# Patient Record
Sex: Male | Born: 1950
Health system: Southern US, Community
[De-identification: ages and names within clinical notes are randomized; demographics above are authoritative.]

## PROBLEM LIST (undated history)

## (undated) DIAGNOSIS — K219 Gastro-esophageal reflux disease without esophagitis: Secondary | ICD-10-CM

## (undated) DIAGNOSIS — E7800 Pure hypercholesterolemia, unspecified: Secondary | ICD-10-CM

## (undated) DIAGNOSIS — E559 Vitamin D deficiency, unspecified: Secondary | ICD-10-CM

## (undated) DIAGNOSIS — M722 Plantar fascial fibromatosis: Secondary | ICD-10-CM

## (undated) DIAGNOSIS — I10 Essential (primary) hypertension: Secondary | ICD-10-CM

## (undated) HISTORY — DX: Essential (primary) hypertension: I10

## (undated) HISTORY — PX: COLONOSCOPY: SHX174

## (undated) HISTORY — DX: Gastro-esophageal reflux disease without esophagitis: K21.9

## (undated) HISTORY — DX: Vitamin D deficiency, unspecified: E55.9

## (undated) HISTORY — DX: Plantar fascial fibromatosis: M72.2

---

## 2013-04-17 HISTORY — PX: ROTATOR CUFF REPAIR: SHX139

## 2013-07-19 ENCOUNTER — Emergency Department (HOSPITAL_COMMUNITY): Payer: BC Managed Care – PPO

## 2013-07-19 ENCOUNTER — Emergency Department (HOSPITAL_COMMUNITY)
Admission: EM | Admit: 2013-07-19 | Discharge: 2013-07-19 | Disposition: A | Discharge status: Home / Self Care | Payer: BC Managed Care – PPO | Attending: Emergency Medicine | Admitting: Emergency Medicine

## 2013-07-19 ENCOUNTER — Encounter (HOSPITAL_COMMUNITY): Payer: Self-pay | Admitting: Emergency Medicine

## 2013-07-19 DIAGNOSIS — Z79899 Other long term (current) drug therapy: Secondary | ICD-10-CM | POA: Insufficient documentation

## 2013-07-19 DIAGNOSIS — R079 Chest pain, unspecified: Secondary | ICD-10-CM

## 2013-07-19 DIAGNOSIS — R072 Precordial pain: Secondary | ICD-10-CM | POA: Insufficient documentation

## 2013-07-19 DIAGNOSIS — Z7982 Long term (current) use of aspirin: Secondary | ICD-10-CM | POA: Insufficient documentation

## 2013-07-19 DIAGNOSIS — R1013 Epigastric pain: Secondary | ICD-10-CM | POA: Insufficient documentation

## 2013-07-19 DIAGNOSIS — E7800 Pure hypercholesterolemia, unspecified: Secondary | ICD-10-CM | POA: Insufficient documentation

## 2013-07-19 HISTORY — DX: Pure hypercholesterolemia, unspecified: E78.00

## 2013-07-19 LAB — CBC
HCT: 41.9 % (ref 39.0–52.0)
Hemoglobin: 14.8 g/dL (ref 13.0–17.0)
MCH: 31.3 pg (ref 26.0–34.0)
MCHC: 35.3 g/dL (ref 30.0–36.0)
MCV: 88.6 fL (ref 78.0–100.0)
PLATELETS: 217 10*3/uL (ref 150–400)
RBC: 4.73 MIL/uL (ref 4.22–5.81)
RDW: 12.9 % (ref 11.5–15.5)
WBC: 5 10*3/uL (ref 4.0–10.5)

## 2013-07-19 LAB — BASIC METABOLIC PANEL
BUN: 16 mg/dL (ref 6–23)
CALCIUM: 9 mg/dL (ref 8.4–10.5)
CO2: 29 mEq/L (ref 19–32)
CREATININE: 0.83 mg/dL (ref 0.50–1.35)
Chloride: 105 mEq/L (ref 96–112)
GFR calc Af Amer: >90 mL/min (ref >90)
GLUCOSE: 85 mg/dL (ref 70–99)
Potassium: 4.2 mEq/L (ref 3.7–5.3)
SODIUM: 143 meq/L (ref 137–147)

## 2013-07-19 LAB — I-STAT TROPONIN, ED
TROPONIN I, POC: 0.01 ng/mL (ref 0.00–0.08)
Troponin i, poc: 0.01 ng/mL (ref 0.00–0.08)

## 2013-07-19 MED ORDER — RANITIDINE HCL 150 MG PO CAPS: 150.0000 mg | ORAL_CAPSULE | Freq: Every day | ORAL | Status: DC

## 2013-07-19 MED ORDER — PANTOPRAZOLE SODIUM 40 MG PO TBEC
40.0000 mg | DELAYED_RELEASE_TABLET | Freq: Once | ORAL | Status: AC
  Administered 2013-07-19: 40 mg via ORAL
  Filled 2013-07-19: qty 1

## 2013-07-19 MED ORDER — ASPIRIN 325 MG PO TABS
325.0000 mg | ORAL_TABLET | ORAL | Status: AC
  Administered 2013-07-19: 325 mg via ORAL
  Filled 2013-07-19: qty 1

## 2013-07-19 MED ORDER — GI COCKTAIL ~~LOC~~
30.0000 mL | Freq: Once | ORAL | Status: AC
  Administered 2013-07-19: 30 mL via ORAL
  Filled 2013-07-19: qty 30

## 2013-07-19 MED ORDER — NITROGLYCERIN 0.4 MG SL SUBL: 0.4000 mg | SUBLINGUAL_TABLET | SUBLINGUAL | Status: DC | PRN

## 2013-07-19 NOTE — ED Notes (Signed)
Pt. Stated, it started about a week ago with some indigestion and some tightness and hasn't stopped.

## 2013-07-19 NOTE — ED Provider Notes (Signed)
CSN: [id number]     Arrival date & time 07/19/13  1345 History   First MD Initiated Contact with Patient 07/19/13 1501     Chief Complaint  Patient presents with  . Chest Pain     (Consider location/radiation/quality/duration/timing/severity/associated sxs/prior Treatment) Patient is a 63 y.o. male presenting with chest pain. The history is provided by the patient and the spouse. No language interpreter was used.  Chest Pain Pain location:  Epigastric and substernal area Pain quality: sharp and tightness   Pain radiates to:  Does not radiate Pain radiates to the back: no   Pain severity:  Mild Onset quality:  Unable to specify Duration:  10 days Timing:  Intermittent Progression:  Worsening Chronicity:  New Context: at rest   Relieved by:  Nothing Worsened by:  Nothing tried Ineffective treatments:  None tried Associated symptoms: no abdominal pain, no back pain, no cough, no diaphoresis, no dizziness, no dysphagia, no fatigue, no fever, no headache, no nausea, no near-syncope, no numbness, no shortness of breath, not vomiting and no weakness   Associated symptoms comment:  "sour stomach"  Risk factors: high cholesterol and male sex   Risk factors: no aortic disease, no coronary artery disease, no diabetes mellitus, not obese, no prior DVT/PE and no smoking     Past Medical History  Diagnosis Date  . Hypercholesterolemia    Past Surgical History  Procedure Laterality Date  . Shoulder surgery     No family history on file. History  Substance Use Topics  . Smoking status: Never Smoker   . Smokeless tobacco: Never Used  . Alcohol Use: Yes    Review of Systems  Constitutional: Negative for fever, diaphoresis, activity change, appetite change and fatigue.  HENT: Negative for congestion, facial swelling, rhinorrhea and trouble swallowing.   Eyes: Negative for photophobia and pain.  Respiratory: Negative for cough, chest tightness and shortness of breath.    Cardiovascular: Positive for chest pain. Negative for leg swelling and near-syncope.  Gastrointestinal: Negative for nausea, vomiting, abdominal pain, diarrhea and constipation.  Endocrine: Negative for polydipsia and polyuria.  Genitourinary: Negative for dysuria, urgency, decreased urine volume and difficulty urinating.  Musculoskeletal: Negative for back pain and gait problem.  Skin: Negative for color change, rash and wound.  Allergic/Immunologic: Negative for immunocompromised state.  Neurological: Negative for dizziness, facial asymmetry, speech difficulty, weakness, numbness and headaches.  Psychiatric/Behavioral: Negative for confusion, decreased concentration and agitation.      Allergies  Review of patient's allergies indicates no known allergies.  Home Medications   Current Outpatient Rx  Name  Route  Sig  Dispense  Refill  . aspirin EC 81 MG tablet   Oral   Take 81 mg by mouth daily.         Marland Kitchen EPINEPHrine (EPIPEN) 0.3 mg/0.3 mL SOAJ injection   Intramuscular   Inject 0.3 mg into the muscle once.         Marland Kitchen omega-3 acid ethyl esters (LOVAZA) 1 G capsule   Oral   Take 1 g by mouth daily.         . pravastatin (PRAVACHOL) 20 MG tablet   Oral   Take 20 mg by mouth daily.         . sertraline (ZOLOFT) 50 MG tablet   Oral   Take 50 mg by mouth daily.         . ranitidine (ZANTAC) 150 MG capsule   Oral   Take 1 capsule (150 mg total) by  mouth daily.   30 capsule   0    BP 117/81  Pulse 50  Temp(Src) 98 F (36.7 C) (Oral)  Resp 12  Ht 5\' 6"  (1.676 m)  Wt 155 lb (70.308 kg)  BMI 25.03 kg/m2  SpO2 100% Physical Exam  Constitutional: He is oriented to person, place, and time. He appears well-developed and well-nourished. No distress.  HENT:  Head: Normocephalic and atraumatic.  Mouth/Throat: No oropharyngeal exudate.  Eyes: Pupils are equal, round, and reactive to light.  Neck: Normal range of motion. Neck supple.  Cardiovascular: Normal  rate, regular rhythm and normal heart sounds.  Exam reveals no gallop and no friction rub.   No murmur heard. Pulmonary/Chest: Effort normal and breath sounds normal. No respiratory distress. He has no wheezes. He has no rales.  Abdominal: Soft. Bowel sounds are normal. He exhibits no distension and no mass. There is tenderness in the epigastric area. There is no rebound and no guarding.  Musculoskeletal: Normal range of motion. He exhibits no edema and no tenderness.  Neurological: He is alert and oriented to person, place, and time.  Skin: Skin is warm and dry.  Psychiatric: He has a normal mood and affect.    ED Course  Procedures (including critical care time) Labs Review Labs Reviewed  BASIC METABOLIC PANEL  CBC  I-STAT Belcher, ED  I-STAT TROPOININ, ED  Randolm Idol, ED   Imaging Review Dg Chest 2 View  07/19/2013   CLINICAL DATA:  Chest pain  EXAM: CHEST  2 VIEW  COMPARISON:  None.  FINDINGS: The heart size and mediastinal contours are within normal limits. Both lungs are clear. The visualized skeletal structures are unremarkable.  IMPRESSION: No active cardiopulmonary disease.   Electronically Signed   By: Inez Catalina M.D.   On: 07/19/2013 16:02     EKG Interpretation   Date/Time:  Saturday July 19 2013 13:53:45 EDT Ventricular Rate:  63 PR Interval:  146 QRS Duration: 90 QT Interval:  406 QTC Calculation: 415 R Axis:   68 Text Interpretation:  Normal sinus rhythm TWI III, aVF No prior for  comparison Confirmed by Cecille Mcclusky  MD, Gavynn Duvall ([id number]) on 07/19/2013 3:06:47 PM      MDM   Final diagnoses:  Chest pain    Pt is a 63 y.o. male with Pmhx as above who presents with what he describes as about 10 days of "indigestion" symptoms of sour stomach, and intermittent sharp pains across chest which have become more frequent since waking today at about 8:30. Pains last several mins, resolve for several mins before returning. No aggravating or alleviating symptoms. No  SOB, n/v, diaphoresis, leg pain/edema. He drove from [place] about 1 week ago to visit daughter in [place], but symptoms had began several days prior to departure. Hx of nuclear stress test about 5 years ago which he reports as nml. On PE, VSS, pt in NAD.  EKG w/ TWI NSTWC, no prior. I-stat Trop neg at 0.01. Pt states he is feeling somewhat improved after ASA & GI cocktail.  Given symptoms are stuttering, will get delta trop.   Delta trop negative. Pt feeling improved. As they are planning of being out of town for several more weeks, age, sex, hx, and EKG findings, spoke w/ cardiology about arranging outpt stress test and the office will try to work him in on Monday. Will start rx for zantac.  Return precautions given for new or worsening symptoms including worsening pain, SOB, diaphoresis.  Neta Ehlers, MD 07/19/13 [phone]

## 2013-07-19 NOTE — Discharge Instructions (Signed)
Chest Pain (Nonspecific) °It is often hard to give a specific diagnosis for the cause of chest pain. There is always a chance that your pain could be related to something serious, such as a heart attack or a blood clot in the lungs. You need to follow up with your caregiver for further evaluation. °CAUSES  °· Heartburn. °· Pneumonia or bronchitis. °· Anxiety or stress. °· Inflammation around your heart (pericarditis) or lung (pleuritis or pleurisy). °· A blood clot in the lung. °· A collapsed lung (pneumothorax). It can develop suddenly on its own (spontaneous pneumothorax) or from injury (trauma) to the chest. °· Shingles infection (herpes zoster virus). °The chest wall is composed of bones, muscles, and cartilage. Any of these can be the source of the pain. °· The bones can be bruised by injury. °· The muscles or cartilage can be strained by coughing or overwork. °· The cartilage can be affected by inflammation and become sore (costochondritis). °DIAGNOSIS  °Lab tests or other studies, such as X-rays, electrocardiography, stress testing, or cardiac imaging, may be needed to find the cause of your pain.  °TREATMENT  °· Treatment depends on what may be causing your chest pain. Treatment may include: °· Acid blockers for heartburn. °· Anti-inflammatory medicine. °· Pain medicine for inflammatory conditions. °· Antibiotics if an infection is present. °· You may be advised to change lifestyle habits. This includes stopping smoking and avoiding alcohol, caffeine, and chocolate. °· You may be advised to keep your head raised (elevated) when sleeping. This reduces the chance of acid going backward from your stomach into your esophagus. °· Most of the time, nonspecific chest pain will improve within 2 to 3 days with rest and mild pain medicine. °HOME CARE INSTRUCTIONS  °· If antibiotics were prescribed, take your antibiotics as directed. Finish them even if you start to feel better. °· For the next few days, avoid physical  activities that bring on chest pain. Continue physical activities as directed. °· Do not smoke. °· Avoid drinking alcohol. °· Only take over-the-counter or prescription medicine for pain, discomfort, or fever as directed by your caregiver. °· Follow your caregiver's suggestions for further testing if your chest pain does not go away. °· Keep any follow-up appointments you made. If you do not go to an appointment, you could develop lasting (chronic) problems with pain. If there is any problem keeping an appointment, you must call to reschedule. °SEEK MEDICAL CARE IF:  °· You think you are having problems from the medicine you are taking. Read your medicine instructions carefully. °· Your chest pain does not go away, even after treatment. °· You develop a rash with blisters on your chest. °SEEK IMMEDIATE MEDICAL CARE IF:  °· You have increased chest pain or pain that spreads to your arm, neck, jaw, back, or abdomen. °· You develop shortness of breath, an increasing cough, or you are coughing up blood. °· You have severe back or abdominal pain, feel nauseous, or vomit. °· You develop severe weakness, fainting, or chills. °· You have a fever. °THIS IS AN EMERGENCY. Do not wait to see if the pain will go away. Get medical help at once. Call your local emergency services (911 in [place] Do not drive yourself to the hospital. °MAKE SURE YOU:  °· Understand these instructions. °· Will watch your condition. °· Will get help right away if you are not doing well or get worse. °Document Released: 01/11/2005 Document Revised: 06/26/2011 Document Reviewed: 11/07/2007 °ExitCare® Patient Information ©2014 [employer]   [employer]. ° °

## 2013-07-21 ENCOUNTER — Telehealth: Payer: Self-pay | Admitting: *Deleted

## 2013-07-21 NOTE — Telephone Encounter (Signed)
Per Dr. Rosezella Florida note to Triage to call pt to have him seen by DOD today or Tomorrow (Tues).  Called pt.  He states he is feeling fine.  No SOB, no CP.  States he has spoken with his doctor in [place] and was advised if he was sx free now he should be fine.  Francisco Dixon states he is leaving in the morning going to [facility] and would not be back in [place] for 2 weeks. He states again that he feels good and appreciated the call. States he doesn't feel like he needs to be seen.

## 2013-07-21 NOTE — Telephone Encounter (Signed)
Message copied by Hetty Blend on Mon Jul 21, 2013  2:39 PM ------      Message from: Minus Breeding      Created: Sat Jul 19, 2013  7:50 PM       This patient needs to be an add on for the DOD new patient Mon or Tuesday (or wherever we can work him in.)  He is staying with family locally.  Please use local contact number.  Let me know who he is seeing. Request from ED.   ------

## 2015-11-12 DIAGNOSIS — I10 Essential (primary) hypertension: Secondary | ICD-10-CM | POA: Diagnosis not present

## 2015-11-12 DIAGNOSIS — E785 Hyperlipidemia, unspecified: Secondary | ICD-10-CM | POA: Diagnosis not present

## 2015-11-12 DIAGNOSIS — R5381 Other malaise: Secondary | ICD-10-CM | POA: Diagnosis not present

## 2015-12-03 DIAGNOSIS — Z1211 Encounter for screening for malignant neoplasm of colon: Secondary | ICD-10-CM | POA: Diagnosis not present

## 2015-12-03 DIAGNOSIS — K649 Unspecified hemorrhoids: Secondary | ICD-10-CM | POA: Diagnosis not present

## 2015-12-03 DIAGNOSIS — K644 Residual hemorrhoidal skin tags: Secondary | ICD-10-CM | POA: Diagnosis not present

## 2015-12-03 DIAGNOSIS — K5730 Diverticulosis of large intestine without perforation or abscess without bleeding: Secondary | ICD-10-CM | POA: Diagnosis not present

## 2016-02-23 ENCOUNTER — Encounter: Payer: Self-pay | Admitting: Internal Medicine

## 2016-02-23 ENCOUNTER — Ambulatory Visit (INDEPENDENT_AMBULATORY_CARE_PROVIDER_SITE_OTHER): Payer: Medicare Other | Admitting: Internal Medicine

## 2016-02-23 VITALS — BP 136/82 | HR 60 | Temp 97.8°F | Resp 16 | Ht 66.0 in | Wt 160.0 lb

## 2016-02-23 DIAGNOSIS — Z79899 Other long term (current) drug therapy: Secondary | ICD-10-CM

## 2016-02-23 DIAGNOSIS — R7309 Other abnormal glucose: Secondary | ICD-10-CM

## 2016-02-23 DIAGNOSIS — I10 Essential (primary) hypertension: Secondary | ICD-10-CM | POA: Diagnosis not present

## 2016-02-23 DIAGNOSIS — E782 Mixed hyperlipidemia: Secondary | ICD-10-CM

## 2016-02-23 DIAGNOSIS — E559 Vitamin D deficiency, unspecified: Secondary | ICD-10-CM

## 2016-02-23 DIAGNOSIS — R0989 Other specified symptoms and signs involving the circulatory and respiratory systems: Secondary | ICD-10-CM

## 2016-02-23 DIAGNOSIS — Z131 Encounter for screening for diabetes mellitus: Secondary | ICD-10-CM

## 2016-02-23 LAB — CBC WITH DIFFERENTIAL/PLATELET
BASOS ABS: 61 {cells}/uL (ref 0–200)
Basophils Relative: 1 %
Eosinophils Absolute: 122 cells/uL (ref 15–500)
Eosinophils Relative: 2 %
HCT: 39.7 % (ref 38.5–50.0)
HEMOGLOBIN: 13.2 g/dL (ref 13.2–17.1)
LYMPHS ABS: 1891 {cells}/uL (ref 850–3900)
Lymphocytes Relative: 31 %
MCH: 29.7 pg (ref 27.0–33.0)
MCHC: 33.2 g/dL (ref 32.0–36.0)
MCV: 89.4 fL (ref 80.0–100.0)
MONOS PCT: 8 %
MPV: 9.6 fL (ref 7.5–12.5)
Monocytes Absolute: 488 cells/uL (ref 200–950)
NEUTROS PCT: 58 %
Neutro Abs: 3538 cells/uL (ref 1500–7800)
Platelets: 237 10*3/uL (ref 140–400)
RBC: 4.44 MIL/uL (ref 4.20–5.80)
RDW: 13.6 % (ref 11.0–15.0)
WBC: 6.1 10*3/uL (ref 3.8–10.8)

## 2016-02-23 LAB — LIPID PANEL
CHOLESTEROL: 198 mg/dL (ref <200)
HDL: 34 mg/dL — ABNORMAL LOW (ref >40)
LDL Cholesterol: 106 mg/dL — ABNORMAL HIGH
TRIGLYCERIDES: 289 mg/dL — AB (ref <150)
Total CHOL/HDL Ratio: 5.8 Ratio — ABNORMAL HIGH (ref <5.0)
VLDL: 58 mg/dL — ABNORMAL HIGH (ref <30)

## 2016-02-23 LAB — HEPATIC FUNCTION PANEL
ALT: 14 U/L (ref 9–46)
AST: 16 U/L (ref 10–35)
Albumin: 4.3 g/dL (ref 3.6–5.1)
Alkaline Phosphatase: 43 U/L (ref 40–115)
Bilirubin, Direct: 0.1 mg/dL (ref <=0.2)
Indirect Bilirubin: 0.3 mg/dL (ref 0.2–1.2)
Total Bilirubin: 0.4 mg/dL (ref 0.2–1.2)
Total Protein: 6.9 g/dL (ref 6.1–8.1)

## 2016-02-23 LAB — BASIC METABOLIC PANEL WITH GFR
BUN: 16 mg/dL (ref 7–25)
CO2: 26 mmol/L (ref 20–31)
CREATININE: 0.92 mg/dL (ref 0.70–1.25)
Calcium: 9.2 mg/dL (ref 8.6–10.3)
Chloride: 105 mmol/L (ref 98–110)
GFR, Est Non African American: 87 mL/min (ref >=60)
Glucose, Bld: 94 mg/dL (ref 65–99)
Potassium: 4 mmol/L (ref 3.5–5.3)
Sodium: 141 mmol/L (ref 135–146)

## 2016-02-23 LAB — MAGNESIUM: MAGNESIUM: 2.1 mg/dL (ref 1.5–2.5)

## 2016-02-23 LAB — TSH: TSH: 2.31 mIU/L (ref 0.40–4.50)

## 2016-02-23 NOTE — Patient Instructions (Signed)

## 2016-02-24 LAB — INSULIN, RANDOM: Insulin: 7.9 u[IU]/mL (ref 2.0–19.6)

## 2016-02-24 LAB — VITAMIN D 25 HYDROXY (VIT D DEFICIENCY, FRACTURES): VIT D 25 HYDROXY: 29 ng/mL — AB (ref 30–100)

## 2016-02-24 LAB — HEMOGLOBIN A1C
Hgb A1c MFr Bld: 5.5 % (ref <5.7)
MEAN PLASMA GLUCOSE: 111 mg/dL

## 2016-02-26 ENCOUNTER — Encounter: Payer: Self-pay | Admitting: Internal Medicine

## 2016-02-26 DIAGNOSIS — R0989 Other specified symptoms and signs involving the circulatory and respiratory systems: Secondary | ICD-10-CM | POA: Insufficient documentation

## 2016-02-26 DIAGNOSIS — E559 Vitamin D deficiency, unspecified: Secondary | ICD-10-CM | POA: Insufficient documentation

## 2016-02-26 DIAGNOSIS — E782 Mixed hyperlipidemia: Secondary | ICD-10-CM | POA: Insufficient documentation

## 2016-02-26 NOTE — Progress Notes (Signed)
[facility] Unk Pinto, M.D.        Uvaldo Bristle. Silverio Lay, P.A.-C       Starlyn Skeans, P.A.-C  [facility]                [address] [place] [id number] Telephone [phone] Telefax [phone] ______________________________________________________________________     This very nice 65 y.o. [name] presents  To establish care as a new patient having recently moved (2 mo) from [place] to reside closer to a daughter and granddaughter.Patient is monitored for labile HTN, Hyperlipidemia, Pre-Diabetes and Vitamin D Deficiency.      Patient has not been treated for HTN & and allegedly it has been controlled. Today's BP is borderline elevated at 136/82. Patient has had no complaints of any cardiac type chest pain, palpitations, dyspnea/orthopnea/PND, dizziness, claudication, or dependent edema.      Patient has been treated for Hyperlipidemia circa 2012 and reports it is controlled with diet & meds. Patient denies myalgias or other med SE's. Current  Lipids are nor at goal: Lab Results  Component Value Date   CHOL 198 02/23/2016   HDL 34 (L) 02/23/2016   LDLCALC 106 (H) 02/23/2016   TRIG 289 (H) 02/23/2016   CHOLHDL 5.8 (H) 02/23/2016      Also, the patient is screened expectantly for PreDiabetes and has had no symptoms of reactive hypoglycemia, diabetic polys, paresthesias or visual blurring.  Current  A1c is at goal: Lab Results  Component Value Date   HGBA1C 5.5 02/23/2016      Further, the patient is not on supplements and is suspect for Vitamin D Deficiency and supplements vitamin D without any suspected side-effects. Current vitamin D is very low:  Lab Results  Component Value Date   VD25OH 29 (L) 02/23/2016   Current Outpatient Prescriptions on File Prior to Visit  Medication Sig  . aspirin EC 81 MG tablet Take 81 mg by mouth daily.  Marland Kitchen EPIPEN)0.3 mg/0.3 mL  injec Inject 0.3 mg into  the muscle once.   Flax Seed Oil 1000 mg  Take 2 caps daily   . Fish Oil 100 mg  Take  2 caps  daily.  . pravastatin  20 MG  Take 20 mg by mouth daily.  . sertraline  50 MG tablet Take 50 mg by mouth daily.   Allergies  Allergen Reactions  . Other     Bee stings   PMHx:   Past Medical History:  Diagnosis Date  . Hypercholesterolemia     There is no immunization history on file for this patient. Past Surgical History:  Procedure Laterality Date  . 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 Systems Review:  Constitutional: Denies fever, chills, wt changes, headaches, insomnia, fatigue, night sweats, change in appetite. Eyes: Denies redness, blurred vision, diplopia, discharge, itchy, watery eyes.  ENT: Denies discharge, congestion, post nasal drip, epistaxis, sore throat, earache, hearing loss, dental pain, tinnitus, vertigo, sinus pain, snoring.  CV: Denies chest pain, palpitations, irregular heartbeat, syncope, dyspnea, diaphoresis, orthopnea, PND, claudication or edema. Respiratory: denies cough, dyspnea, DOE, pleurisy, hoarseness, laryngitis, wheezing.  Gastrointestinal: Denies dysphagia, odynophagia, heartburn, reflux, water brash, abdominal pain or cramps, nausea, vomiting, bloating, diarrhea, constipation, hematemesis,  melena, hematochezia  or hemorrhoids. Genitourinary: Denies dysuria, frequency, urgency, nocturia, hesitancy, discharge, hematuria or flank pain. Musculoskeletal: Denies arthralgias, myalgias, stiffness, jt. swelling, pain, limping or strain/sprain.  Skin: Denies pruritus, rash, hives, warts, acne, eczema or change in skin lesion(s). Neuro: No weakness, tremor, incoordination, spasms, paresthesia or pain. Psychiatric: Denies confusion, memory loss or sensory loss. Endo: Denies change in weight, skin or hair change.  Heme/Lymph: No excessive bleeding, bruising or  enlarged lymph nodes.  Physical Exam BP 136/82   Pulse 60   Temp 97.8 F (36.6 C)   Resp 16   Ht 5\' 6"  (1.676 m)   Wt 160 lb (72.6 kg)   BMI 25.82 kg/m   Appears well nourished and in no distress.  Eyes: PERRLA, EOMs, conjunctiva no swelling or erythema. Sinuses: No frontal/maxillary tenderness ENT/Mouth: EAC's clear, TM's nl w/o erythema, bulging. Nares clear w/o erythema, swelling, exudates. Oropharynx clear without erythema or exudates. Oral hygiene is good. Tongue normal, non obstructing. Hearing intact.  Neck: Supple. Thyroid nl. Car 2+/2+ without bruits, nodes or JVD. Chest: Respirations nl with BS clear & equal w/o rales, rhonchi, wheezing or stridor.  Cor: Heart sounds normal w/ regular rate and rhythm without sig. murmurs, gallops, clicks, or rubs. Peripheral pulses normal and equal  without edema.  Abdomen: Soft & bowel sounds normal. Non-tender w/o guarding, rebound, hernias, masses, or organomegaly.  Lymphatics: Unremarkable.  Musculoskeletal: Full ROM all peripheral extremities, joint stability, 5/5 strength, and normal gait.  Skin: Warm, dry without exposed rashes, lesions or ecchymosis apparent.  Neuro: Cranial nerves intact, reflexes equal bilaterally. Sensory-motor testing grossly intact. Tendon reflexes grossly intact.  Pysch: Alert & oriented x 3.  Insight and judgement nl & appropriate. No ideations.  Assessment and Plan:   1. Essential hypertension, labile   - Continue monitor blood pressure at home.  - Recommend DASH diet.  - Reminder to go to the ER if any CP, SOB, nausea,  dizziness, severe HA, changes vision/speech,  left arm numbness and tingling and jaw pain.  - TSH  2. Mixed hyperlipidemia  - Continue diet/meds, exercise & lifestyle modifications.  - Continue monitor periodic cholesterol/liver & renal functions   - Lipid panel - TSH  3. Other abnormal glucose  - Continue prudent diet, exercise, lifestyle modifications.  - Monitor  appropriate labs - Hemoglobin A1c - Insulin, random  4. Vitamin D deficiency  - Recommend  supplementation. - VITAMIN D 25 Hydroxy   5. Medication management  - CBC with Differential/Platelet - BASIC METABOLIC PANEL WITH GFR - Hepatic function panel - Magnesium       Recommended regular exercise, BP monitoring, weight control, and discussed med and SE's. Recommended labs to assess and monitor clinical status. Further disposition pending results of labs. Over 30 minutes of exam, counseling, chart review was performed

## 2016-03-06 ENCOUNTER — Ambulatory Visit (INDEPENDENT_AMBULATORY_CARE_PROVIDER_SITE_OTHER): Payer: Medicare Other | Admitting: Internal Medicine

## 2016-03-06 ENCOUNTER — Encounter: Payer: Self-pay | Admitting: Internal Medicine

## 2016-03-06 VITALS — BP 110/66 | HR 72 | Temp 97.5°F | Resp 16 | Ht 66.0 in | Wt 158.0 lb

## 2016-03-06 DIAGNOSIS — N451 Epididymitis: Secondary | ICD-10-CM

## 2016-03-06 MED ORDER — DOXYCYCLINE HYCLATE 100 MG PO CAPS: ORAL_CAPSULE | ORAL | 1 refills | Status: AC

## 2016-03-06 NOTE — Progress Notes (Signed)
  Subjective:    Patient ID: Francisco Dixon, male    DOB: July 02, 1950, 65 y.o.   MRN: [id number]  HPI   Patient with c/o of just noticing a tender lump in his Left scrotum last 2-3 days.   Medication Sig  . aspirin EC 81 MG tablet Take 81 mg by mouth daily.  Marland Kitchen EPIPEN   Inject 0.3 mg into the muscle once.  Marland Kitchen FLAXSEED OIL1000 MG  Take 2 capsules by mouth daily.  Marland Kitchen omega-3 Fish Oil  1 G  Take 1 g by mouth daily.  . pravastatin 20 MG  Take 20 mg by mouth daily.  . sertraline  50 MG  Take 50 mg by mouth daily.   Allergies  Allergen Reactions  . Other     Bee stings   Past Medical History:  Diagnosis Date  . GERD (gastroesophageal reflux disease)   . Hypercholesterolemia   . Hypertension    labile   . Vitamin D deficiency    Review of Systems  10 point systems review negative except as above.     Objective:   Physical Exam  BP 110/66   Pulse 72   Temp 97.5 F (36.4 C)   Resp 16   Ht 5\' 6"  (1.676 m)   Wt 158 lb (71.7 kg)   BMI 25.50 kg/m   GU focused exam demonstrates no inguinal hernias. Rt cord & testes Nl. / Left cord & testes Nl with 2 palpable tender 1 cm epididymal cysts    Assessment & Plan:   1. Epididymitis, left  - doxycycline (VIBRAMYCIN) 100 MG capsule; Take 1 capsule daily with food for infection  Dispense: 45 capsule; Refill: 1  - discussed meds/SE's.  - patient reassured and relieved.

## 2016-03-06 NOTE — Patient Instructions (Signed)
Epididymitis Introduction Epididymitis is swelling (inflammation) of the epididymis. The epididymis is a cord-like structure that is located along the top and back part of the testicle. It collects and stores sperm from the testicle. This condition can also cause pain and swelling of the testicle and scrotum. Symptoms usually start suddenly (acute epididymitis). Sometimes epididymitis starts gradually and lasts for a while (chronic epididymitis). This type may be harder to treat. What are the causes? In men 35 and younger, this condition is usually caused by a bacterial infection or sexually transmitted disease (STD), such as:  Gonorrhea.  Chlamydia. In men 35 and older who do not have anal sex, this condition is usually caused by bacteria from a blockage or abnormalities in the urinary system. These can result from:  Having a tube placed into the bladder (urinary catheter).  Having an enlarged or inflamed prostate gland.  Having recent urinary tract surgery. In men who have a condition that weakens the body's defense system (immune system), such as HIV, this condition can be caused by:  Other bacteria, including tuberculosis and syphilis.  Viruses.  Fungi. Sometimes this condition occurs without infection. That may happen if urine flows backward into the epididymis after heavy lifting or straining. What increases the risk? This condition is more likely to develop in men:  Who have unprotected sex with more than one partner.  Who have anal sex.  Who have recently had surgery.  Who have a urinary catheter.  Who have urinary problems.  Who have a suppressed immune system. What are the signs or symptoms? This condition usually begins suddenly with chills, fever, and pain behind the scrotum and in the testicle. Other symptoms include:  Swelling of the scrotum, testicle, or both.  Pain whenejaculatingor urinating.  Pain in the back or belly.  Nausea.  Itching and  discharge from the penis.  Frequent need to pass urine.  Redness and tenderness of the scrotum. How is this diagnosed? Your health care provider can diagnose this condition based on your symptoms and medical history. Your health care provider will also do a physical exam to ask about your symptoms and check your scrotum and testicle for swelling, pain, and redness. You may also have other tests, including:  Examination of discharge from the penis.  Urine tests for infections, such as STDs. Your health care provider may test you for other STDs, including HIV. How is this treated? Treatment for this condition depends on the cause. If your condition is caused by a bacterial infection, oral antibiotic medicine may be prescribed. If the bacterial infection has spread to your blood, you may need to receive IV antibiotics. Nonbacterial epididymitis is treated with home care that includes bed rest and elevation of the scrotum. Surgery may be needed to treat:  Bacterial epididymitis that causes pus to build up in the scrotum (abscess).  Chronic epididymitis that has not responded to other treatments. Follow these instructions at home: Medicines  Take over-the-counter and prescription medicines only as told by your health care provider.  If you were prescribed an antibiotic medicine, take it as told by your health care provider. Do not stop taking the antibiotic even if your condition improves. Sexual Activity  If your epididymitis was caused by an STD, avoid sexual activity until your treatment is complete.  Inform your sexual partner or partners if you test positive for an STD. They may need to be treated.Do not engage in sexual activity with your partner or partners until their treatment is completed.   General instructions  Return to your normal activities as told by your health care provider. Ask your health care provider what activities are safe for you.  Keep your scrotum elevated and  supported while resting. Ask your health care provider if you should wear a scrotal support, such as a jockstrap. Wear it as told by your health care provider.  If directed, apply ice to the affected area:  Put ice in a plastic bag.  Place a towel between your skin and the bag.  Leave the ice on for 20 minutes, 2-3 times per day.  Try taking a sitz bath to help with discomfort. This is a warm water bath that is taken while you are sitting down. The water should only come up to your hips and should cover your buttocks. Do this 3-4 times per day or as told by your health care provider.  Keep all follow-up visits as told by your health care provider. This is important. Contact a health care provider if:  You have a fever.  Your pain medicine is not helping.  Your pain is getting worse.  Your symptoms do not improve within three days. This information is not intended to replace advice given to you by your health care provider. Make sure you discuss any questions you have with your health care provider. Document Released: 03/31/2000 Document Revised: 09/09/2015 Document Reviewed: 08/19/2014  2017 [employer]vier  

## 2016-03-27 ENCOUNTER — Other Ambulatory Visit: Payer: Self-pay | Admitting: *Deleted

## 2016-03-27 MED ORDER — SERTRALINE HCL 50 MG PO TABS: 50.0000 mg | ORAL_TABLET | Freq: Every day | ORAL | 0 refills | Status: DC

## 2016-04-18 DIAGNOSIS — D1800 Hemangioma unspecified site: Secondary | ICD-10-CM | POA: Diagnosis not present

## 2016-04-18 DIAGNOSIS — L821 Other seborrheic keratosis: Secondary | ICD-10-CM | POA: Diagnosis not present

## 2016-04-18 DIAGNOSIS — D229 Melanocytic nevi, unspecified: Secondary | ICD-10-CM | POA: Diagnosis not present

## 2016-06-07 ENCOUNTER — Ambulatory Visit (INDEPENDENT_AMBULATORY_CARE_PROVIDER_SITE_OTHER): Payer: Medicare Other | Admitting: Internal Medicine

## 2016-06-07 ENCOUNTER — Encounter: Payer: Self-pay | Admitting: Internal Medicine

## 2016-06-07 VITALS — BP 110/60 | HR 63 | Temp 97.3°F | Resp 16 | Ht 66.0 in | Wt 160.4 lb

## 2016-06-07 DIAGNOSIS — Z0001 Encounter for general adult medical examination with abnormal findings: Secondary | ICD-10-CM | POA: Diagnosis not present

## 2016-06-07 DIAGNOSIS — I10 Essential (primary) hypertension: Secondary | ICD-10-CM | POA: Diagnosis not present

## 2016-06-07 DIAGNOSIS — E559 Vitamin D deficiency, unspecified: Secondary | ICD-10-CM | POA: Diagnosis not present

## 2016-06-07 DIAGNOSIS — E782 Mixed hyperlipidemia: Secondary | ICD-10-CM | POA: Diagnosis not present

## 2016-06-07 DIAGNOSIS — R6889 Other general symptoms and signs: Secondary | ICD-10-CM

## 2016-06-07 DIAGNOSIS — F411 Generalized anxiety disorder: Secondary | ICD-10-CM | POA: Diagnosis not present

## 2016-06-07 DIAGNOSIS — Z0000 Encounter for general adult medical examination without abnormal findings: Secondary | ICD-10-CM

## 2016-06-07 DIAGNOSIS — R0989 Other specified symptoms and signs involving the circulatory and respiratory systems: Secondary | ICD-10-CM

## 2016-06-07 MED ORDER — BUPROPION HCL ER (XL) 150 MG PO TB24: 150.0000 mg | ORAL_TABLET | ORAL | 2 refills | Status: DC

## 2016-06-07 NOTE — Progress Notes (Signed)
MEDICARE ANNUAL WELLNESS VISIT AND FOLLOW UP Assessment:    1. Labile hypertension -well controlled -not on medications -dash diet -monitor at home  2. Mixed hyperlipidemia -cont pravastatin -diet and exercise -monitor LFTs twice yearly -lipid panel at next visit  3. Vitamin D deficiency -cont VIt D  4. GAD (generalized anxiety disorder) -cont Wellbutrin and zoloft  5. Medicare annual wellness visit, subsequent -due next year    Over 30 minutes of exam, counseling, chart review, and critical decision making was performed  Future Appointments Date Time Provider [place]  09/08/2016 9:30 AM Unk Pinto, MD [facility] None     Plan:   During the course of the visit the patient was educated and counseled about appropriate screening and preventive services including:    Pneumococcal vaccine   Influenza vaccine  Prevnar 13  Td vaccine  Screening electrocardiogram  Colorectal cancer screening  Diabetes screening  Glaucoma screening  Nutrition counseling    Subjective:  Francisco ELIOPOULOS is a 66 y.o. male who presents for Medicare Annual Wellness Visit and 3 month follow up for HTN, hyperlipidemia, prediabetes, and vitamin D Def.   His blood pressure has been controlled at home, today their BP is BP: 110/60 He does not workout. He denies chest pain, shortness of breath, dizziness.    He is on cholesterol medication and denies myalgias. His cholesterol is at goal. The cholesterol last visit was:   Lab Results  Component Value Date   CHOL 198 02/23/2016   HDL 34 (L) 02/23/2016   LDLCALC 106 (H) 02/23/2016   TRIG 289 (H) 02/23/2016   CHOLHDL 5.8 (H) 02/23/2016    :  Lab Results  Component Value Date   HGBA1C 5.5 02/23/2016   Last GFR Lab Results  Component Value Date   GFRNONAA 87 02/23/2016     Lab Results  Component Value Date   GFRAA >89 02/23/2016   Patient is on Vitamin D supplement.   Lab Results  Component Value Date    VD25OH 29 (L) 02/23/2016      He reports that he feels like he is having a hard time with stress from moving.  He feels like he is not having panic attacks.  He is taking the zoloft.  It does help.  He is looking for a new house to move into.  He is coping well with the stress.    Medication Review: Current Outpatient Prescriptions on File Prior to Visit  Medication Sig Dispense Refill  . aspirin EC 81 MG tablet Take 81 mg by mouth daily.    Marland Kitchen EPINEPHrine (EPIPEN) 0.3 mg/0.3 mL SOAJ injection Inject 0.3 mg into the muscle once.    . Flaxseed, Linseed, (FLAXSEED OIL) 1000 MG CAPS Take 2 capsules by mouth daily.    Marland Kitchen omega-3 acid ethyl esters (LOVAZA) 1 G capsule Take 1 g by mouth daily.    . pravastatin (PRAVACHOL) 20 MG tablet Take 20 mg by mouth daily.    . sertraline (ZOLOFT) 50 MG tablet Take 1 tablet (50 mg total) by mouth daily. 90 tablet 0   No current facility-administered medications on file prior to visit.     Allergies: Allergies  Allergen Reactions  . Other     Bee stings    Current Problems (verified) has Labile hypertension; Mixed hyperlipidemia; Vitamin D deficiency; and Diabetes mellitus screening on his problem list.  Screening Tests  There is no immunization history on file for this patient.  Currently waiting for shot records  from previous office   Preventative care: Last colonoscopy:  August 2017  No shot records on file.    Names of Other Physician/Practitioners you currently use: 1. [facility] here for primary care 2. Doctor in [place], eye doctor, last visit 2017 3.  dentist, last visit 2017 Patient Care Team: Unk Pinto, MD as PCP - General (Internal Medicine)  Surgical: He  has a past surgical history that includes Colonoscopy (N/A, 2002,2007,2012) and Rotator cuff repair (Right, 2015). Family His family history is not on file. Social history  He reports that he has never smoked. He has never  used smokeless tobacco. He reports that he drinks alcohol. He reports that he does not use drugs.  MEDICARE WELLNESS OBJECTIVES: Physical activity: Current Exercise Habits: Home exercise routine Cardiac risk factors: Cardiac Risk Factors include: advanced age (>56men, >69 women);hypertension Depression/mood screen:   Depression screen [facility] 2/9 06/07/2016  Decreased Interest 0  Down, Depressed, Hopeless 1  PHQ - 2 Score 1    ADLs:  In your present state of health, do you have any difficulty performing the following activities: 06/07/2016 02/26/2016  Hearing? Y N  Vision? N N  Difficulty concentrating or making decisions? N N  Walking or climbing stairs? N N  Dressing or bathing? N N  Doing errands, shopping? N N  Preparing Food and eating ? N -  Using the Toilet? N -  In the past six months, have you accidently leaked urine? N -  Do you have problems with loss of bowel control? N -  Managing your Medications? N -  Managing your Finances? N -  Housekeeping or managing your Housekeeping? N -  Some recent data might be hidden     Cognitive Testing  Alert? Yes  Normal Appearance?Yes  Oriented to person? Yes  Place? Yes   Time? Yes  Recall of three objects?  Yes  Can perform simple calculations? Yes  Displays appropriate judgment?Yes  Can read the correct time from a watch face?Yes  EOL planning: Does Patient Have a Medical Advance Directive?: No Would patient like information on creating a medical advance directive?: Yes (MAU/Ambulatory/Procedural Areas - Information given)   Objective:   Today's Vitals   06/07/16 0854  BP: 110/60  Pulse: 63  Resp: 16  Temp: 97.3 F (36.3 C)  SpO2: 98%  Weight: 160 lb 6.4 oz (72.8 kg)  Height: 5\' 6"  (1.676 m)   Body mass index is 25.89 kg/m.  General appearance: alert, no distress, WD/WN, male HEENT: normocephalic, sclerae anicteric, TMs pearly, nares patent, no discharge or erythema, pharynx normal Oral cavity: MMM, no  lesions Neck: supple, no lymphadenopathy, no thyromegaly, no masses Heart: RRR, normal S1, S2, no murmurs Lungs: CTA bilaterally, no wheezes, rhonchi, or rales Abdomen: +bs, soft, non tender, non distended, no masses, no hepatomegaly, no splenomegaly Musculoskeletal: nontender, no swelling, no obvious deformity Extremities: no edema, no cyanosis, no clubbing Pulses: 2+ symmetric, upper and lower extremities, normal cap refill Neurological: alert, oriented x 3, CN2-12 intact, strength normal upper extremities and lower extremities, sensation normal throughout, DTRs 2+ throughout, no cerebellar signs, gait normal Psychiatric: normal affect, behavior normal, pleasant   Medicare Attestation I have personally reviewed: The patient's medical and social history Their use of alcohol, tobacco or illicit drugs Their current medications and supplements The patient's functional ability including ADLs,fall risks, home safety risks, cognitive, and hearing and visual impairment Diet and physical activities Evidence for depression or mood disorders  The  patient's weight, height, BMI, and visual acuity have been recorded in the chart.  I have made referrals, counseling, and provided education to the patient based on review of the above and I have provided the patient with a written personalized care plan for preventive services.     Starlyn Skeans, PA-C   06/10/2016

## 2016-06-22 ENCOUNTER — Encounter: Payer: Self-pay | Admitting: Internal Medicine

## 2016-07-13 ENCOUNTER — Other Ambulatory Visit: Payer: Self-pay | Admitting: Internal Medicine

## 2016-09-08 ENCOUNTER — Ambulatory Visit (INDEPENDENT_AMBULATORY_CARE_PROVIDER_SITE_OTHER): Payer: Medicare Other | Admitting: Internal Medicine

## 2016-09-08 VITALS — BP 106/76 | HR 64 | Temp 97.3°F | Resp 16 | Ht 66.0 in | Wt 163.6 lb

## 2016-09-08 DIAGNOSIS — E782 Mixed hyperlipidemia: Secondary | ICD-10-CM | POA: Diagnosis not present

## 2016-09-08 DIAGNOSIS — E559 Vitamin D deficiency, unspecified: Secondary | ICD-10-CM | POA: Diagnosis not present

## 2016-09-08 DIAGNOSIS — R7309 Other abnormal glucose: Secondary | ICD-10-CM

## 2016-09-08 DIAGNOSIS — B351 Tinea unguium: Secondary | ICD-10-CM

## 2016-09-08 DIAGNOSIS — Z79899 Other long term (current) drug therapy: Secondary | ICD-10-CM

## 2016-09-08 DIAGNOSIS — R0989 Other specified symptoms and signs involving the circulatory and respiratory systems: Secondary | ICD-10-CM | POA: Diagnosis not present

## 2016-09-08 LAB — CBC WITH DIFFERENTIAL/PLATELET
Basophils Absolute: 43 cells/uL (ref 0–200)
Basophils Relative: 1 %
Eosinophils Absolute: 215 cells/uL (ref 15–500)
Eosinophils Relative: 5 %
HEMATOCRIT: 42.7 % (ref 38.5–50.0)
Hemoglobin: 14.3 g/dL (ref 13.2–17.1)
LYMPHS ABS: 1462 {cells}/uL (ref 850–3900)
LYMPHS PCT: 34 %
MCH: 30.2 pg (ref 27.0–33.0)
MCHC: 33.5 g/dL (ref 32.0–36.0)
MCV: 90.1 fL (ref 80.0–100.0)
MONO ABS: 301 {cells}/uL (ref 200–950)
MPV: 9.9 fL (ref 7.5–12.5)
Monocytes Relative: 7 %
Neutro Abs: 2279 cells/uL (ref 1500–7800)
Neutrophils Relative %: 53 %
Platelets: 262 10*3/uL (ref 140–400)
RBC: 4.74 MIL/uL (ref 4.20–5.80)
RDW: 13.7 % (ref 11.0–15.0)
WBC: 4.3 10*3/uL (ref 3.8–10.8)

## 2016-09-08 LAB — TSH: TSH: 2.34 mIU/L (ref 0.40–4.50)

## 2016-09-08 MED ORDER — TERBINAFINE HCL 250 MG PO TABS: ORAL_TABLET | ORAL | 0 refills | Status: DC

## 2016-09-08 NOTE — Progress Notes (Signed)
This very nice 66 y.o. [name] presents for 3 month follow up with Labile Hypertension, Hyperlipidemia, Pre-Diabetes and Vitamin D Deficiency.      Patient has hx/o labile HTN   And is monitored expectantly & BP has been controlled at home. Today's BP: is at goal - 106/76. Patient has had no complaints of any cardiac type chest pain, palpitations, dyspnea/orthopnea/PND, dizziness, claudication, or dependent edema.     Hyperlipidemia (2012)  is controlled with diet & meds. Patient denies myalgias or other med SE's. Last Lipids were near, but not at goal with elevated Trig's: Lab Results  Component Value Date   CHOL 198 02/23/2016   HDL 34 (L) 02/23/2016   LDLCALC 106 (H) 02/23/2016   TRIG 289 (H) 02/23/2016   CHOLHDL 5.8 (H) 02/23/2016      Also, the patient is monitored  PreDiabetes and has had no symptoms of reactive hypoglycemia, diabetic polys, paresthesias or visual blurring.  Last A1c was  Lab Results  Component Value Date   HGBA1C 5.5 02/23/2016      Further, the patient also has history of Vitamin D Deficiency and supplements vitamin D without any suspected side-effects. Last vitamin D was   Lab Results  Component Value Date   VD25OH 44 (L) 02/23/2016   Current Outpatient Prescriptions on File Prior to Visit  Medication Sig  . aspirin EC 81 MG tablet Take 81 mg by mouth daily.  Marland Kitchen EPINEPHrine (EPIPEN) 0.3 mg/0.3 mL SOAJ injection Inject 0.3 mg into the muscle once.  . Flaxseed, Linseed, (FLAXSEED OIL) 1000 MG CAPS Take 2 capsules by mouth daily.  Marland Kitchen omega-3 acid ethyl esters (LOVAZA) 1 G capsule Take 1 g by mouth daily.  . pravastatin (PRAVACHOL) 20 MG tablet Take 20 mg by mouth daily.  . sertraline (ZOLOFT) 50 MG tablet TAKE 1 TABLET (50 MG TOTAL) BY MOUTH DAILY.   No current facility-administered medications on file prior to visit.    Allergies  Allergen Reactions  . Other     Bee stings   PMHx:   Past Medical History:  Diagnosis Date  . GERD (gastroesophageal  reflux disease)   . Hypercholesterolemia   . Hypertension    labile   . Vitamin D deficiency     There is no immunization history on file for this patient. Past Surgical History:  Procedure Laterality Date  . COLONOSCOPY N/A 2002,2007,2012  . ROTATOR CUFF REPAIR Right 2015   FHx:    Reviewed / unchanged  SHx:    Reviewed / unchanged  Systems Review:  Constitutional: Denies fever, chills, wt changes, headaches, insomnia, fatigue, night sweats, change in appetite. Eyes: Denies redness, blurred vision, diplopia, discharge, itchy, watery eyes.  ENT: Denies discharge, congestion, post nasal drip, epistaxis, sore throat, earache, hearing loss, dental pain, tinnitus, vertigo, sinus pain, snoring.  CV: Denies chest pain, palpitations, irregular heartbeat, syncope, dyspnea, diaphoresis, orthopnea, PND, claudication or edema. Respiratory: denies cough, dyspnea, DOE, pleurisy, hoarseness, laryngitis, wheezing.  Gastrointestinal: Denies dysphagia, odynophagia, heartburn, reflux, water brash, abdominal pain or cramps, nausea, vomiting, bloating, diarrhea, constipation, hematemesis, melena, hematochezia  or hemorrhoids. Genitourinary: Denies dysuria, frequency, urgency, nocturia, hesitancy, discharge, hematuria or flank pain. Musculoskeletal: Denies arthralgias, myalgias, stiffness, jt. swelling, pain, limping or strain/sprain.  Skin: Denies pruritus, rash, hives, warts, acne, eczema or change in skin lesion(s). Neuro: No weakness, tremor, incoordination, spasms, paresthesia or pain. Psychiatric: Denies confusion, memory loss or sensory loss. Endo: Denies change in weight, skin or hair change.  Heme/Lymph:  No excessive bleeding, bruising or enlarged lymph nodes.  Physical Exam  BP 106/76   Pulse 64   Temp 97.3 F (36.3 C)   Resp 16   Ht 5\' 6"  (1.676 m)   Wt 163 lb 9.6 oz (74.2 kg)   BMI 26.41 kg/m   Appears well nourished, well groomed  and in no distress.  Eyes: PERRLA, EOMs,  conjunctiva no swelling or erythema. Sinuses: No frontal/maxillary tenderness ENT/Mouth: EAC's clear, TM's nl w/o erythema, bulging. Nares clear w/o erythema, swelling, exudates. Oropharynx clear without erythema or exudates. Oral hygiene is good. Tongue normal, non obstructing. Hearing intact.  Neck: Supple. Thyroid nl. Car 2+/2+ without bruits, nodes or JVD. Chest: Respirations nl with BS clear & equal w/o rales, rhonchi, wheezing or stridor.  Cor: Heart sounds normal w/ regular rate and rhythm without sig. murmurs, gallops, clicks or rubs. Peripheral pulses normal and equal  without edema.  Abdomen: Soft & bowel sounds normal. Non-tender w/o guarding, rebound, hernias, masses or organomegaly.  Lymphatics: Unremarkable.  Musculoskeletal: Full ROM all peripheral extremities, joint stability, 5/5 strength and normal gait.  Skin: Warm, dry without exposed rashes, lesions or ecchymosis apparent. Dystrophic toenails. Neuro: Cranial nerves intact, reflexes equal bilaterally. Sensory-motor testing grossly intact. Tendon reflexes grossly intact.  Pysch: Alert & oriented x 3.  Insight and judgement nl & appropriate. No ideations.  Assessment and Plan:  1. Labile hypertension  - Continue medication, monitor blood pressure at home.  - Continue DASH diet. Reminder to go to the ER if any CP,  SOB, nausea, dizziness, severe HA, changes vision/speech,  left arm numbness and tingling and jaw pain. - CBC with Differential/Platelet - BASIC METABOLIC PANEL WITH GFR - Magnesium - TSH  2. Hyperlipidemia, mixed  - Continue diet/meds, exercise,& lifestyle modifications.  - Continue monitor periodic cholesterol/liver & renal functions   - Hepatic function panel - Lipid panel - TSH  3. Other abnormal glucose  - Continue diet, exercise, lifestyle modifications.  - Monitor appropriate labs.  - Insulin, random  4. Vitamin D deficiency  - Continue supplementation.  - VITAMIN D 25 Hydroxy   5.  Onychomycosis of toenail  - terbinafine (LAMISIL) 250 MG tablet; Take 1 tablet daily for fungal toenail infection  Dispense: 90 tablet; Refill: 0  6. Medication management  - CBC with Differential/Platelet - BASIC METABOLIC PANEL WITH GFR - Hepatic function panel - Magnesium - Lipid panel - TSH - Hemoglobin A1c - Insulin, random - VITAMIN D 25 Hydroxy         Discussed  regular exercise, BP monitoring, weight control to achieve/maintain BMI less than 25 and discussed med and SE's. Recommended labs to assess and monitor clinical status with further disposition pending results of labs. Over 30 minutes of exam, counseling, chart review was performed.

## 2016-09-08 NOTE — Patient Instructions (Signed)

## 2016-09-09 ENCOUNTER — Encounter: Payer: Self-pay | Admitting: Internal Medicine

## 2016-09-09 LAB — BASIC METABOLIC PANEL WITH GFR
BUN: 20 mg/dL (ref 7–25)
CO2: 25 mmol/L (ref 20–31)
Calcium: 9.7 mg/dL (ref 8.6–10.3)
Chloride: 104 mmol/L (ref 98–110)
Creat: 1.04 mg/dL (ref 0.70–1.25)
GFR, EST AFRICAN AMERICAN: 86 mL/min (ref >=60)
GFR, EST NON AFRICAN AMERICAN: 74 mL/min (ref >=60)
GLUCOSE: 85 mg/dL (ref 65–99)
POTASSIUM: 4.3 mmol/L (ref 3.5–5.3)
SODIUM: 139 mmol/L (ref 135–146)

## 2016-09-09 LAB — LIPID PANEL
CHOL/HDL RATIO: 5.7 ratio — AB (ref <5.0)
CHOLESTEROL: 204 mg/dL — AB (ref <200)
HDL: 36 mg/dL — AB (ref >40)
LDL CALC: 142 mg/dL — AB (ref <100)
TRIGLYCERIDES: 130 mg/dL (ref <150)
VLDL: 26 mg/dL (ref <30)

## 2016-09-09 LAB — HEPATIC FUNCTION PANEL
ALK PHOS: 48 U/L (ref 40–115)
ALT: 16 U/L (ref 9–46)
AST: 18 U/L (ref 10–35)
Albumin: 4.3 g/dL (ref 3.6–5.1)
BILIRUBIN DIRECT: 0.2 mg/dL (ref <=0.2)
BILIRUBIN INDIRECT: 0.6 mg/dL (ref 0.2–1.2)
BILIRUBIN TOTAL: 0.8 mg/dL (ref 0.2–1.2)
TOTAL PROTEIN: 7.2 g/dL (ref 6.1–8.1)

## 2016-09-09 LAB — HEMOGLOBIN A1C
Hgb A1c MFr Bld: 5.5 % (ref <5.7)
Mean Plasma Glucose: 111 mg/dL

## 2016-09-09 LAB — MAGNESIUM: Magnesium: 2.1 mg/dL (ref 1.5–2.5)

## 2016-09-09 LAB — VITAMIN D 25 HYDROXY (VIT D DEFICIENCY, FRACTURES): Vit D, 25-Hydroxy: 87 ng/mL (ref 30–100)

## 2016-09-12 LAB — INSULIN, RANDOM: Insulin: 38.4 u[IU]/mL — ABNORMAL HIGH (ref 2.0–19.6)

## 2016-09-15 ENCOUNTER — Encounter: Payer: Self-pay | Admitting: Internal Medicine

## 2016-09-15 ENCOUNTER — Other Ambulatory Visit: Payer: Self-pay | Admitting: Internal Medicine

## 2016-09-15 DIAGNOSIS — E782 Mixed hyperlipidemia: Secondary | ICD-10-CM

## 2016-09-15 MED ORDER — ROSUVASTATIN CALCIUM 40 MG PO TABS: ORAL_TABLET | ORAL | 5 refills | Status: DC

## 2016-10-03 ENCOUNTER — Telehealth: Payer: Self-pay | Admitting: *Deleted

## 2016-10-03 MED ORDER — LEVOFLOXACIN 500 MG PO TABS: 500.0000 mg | ORAL_TABLET | Freq: Every day | ORAL | 0 refills | Status: DC

## 2016-10-03 MED ORDER — AMOXICILLIN 250 MG PO CAPS: 250.0000 mg | ORAL_CAPSULE | Freq: Three times a day (TID) | ORAL | 0 refills | Status: DC

## 2016-10-03 NOTE — Telephone Encounter (Signed)
Patient called and states he has sinus drainage, with a sore throat and non-productive cough, x 4 days. Per Dr Melford Aase, send in an RX for Levaquin 500 mg, since the patient states that the Z-pak has not relieved his symptoms in the past.  The patient is aware.

## 2016-10-03 NOTE — Telephone Encounter (Signed)
Patient was informed that Dr Melford Aase would cancel his Levaquin RX, at the patient's request, and an RX for Amoxicillin has been sent to his pharmacy.

## 2016-10-13 ENCOUNTER — Other Ambulatory Visit: Payer: Self-pay | Admitting: Internal Medicine

## 2016-10-24 ENCOUNTER — Other Ambulatory Visit: Payer: Medicare Other

## 2016-10-24 DIAGNOSIS — B351 Tinea unguium: Secondary | ICD-10-CM | POA: Diagnosis not present

## 2016-10-24 DIAGNOSIS — Z79899 Other long term (current) drug therapy: Secondary | ICD-10-CM

## 2016-10-25 LAB — HEPATIC FUNCTION PANEL
ALK PHOS: 46 U/L (ref 40–115)
ALT: 16 U/L (ref 9–46)
AST: 16 U/L (ref 10–35)
Albumin: 4.4 g/dL (ref 3.6–5.1)
BILIRUBIN DIRECT: 0.1 mg/dL (ref <=0.2)
BILIRUBIN INDIRECT: 0.6 mg/dL (ref 0.2–1.2)
Total Bilirubin: 0.7 mg/dL (ref 0.2–1.2)
Total Protein: 7.2 g/dL (ref 6.1–8.1)

## 2016-11-07 ENCOUNTER — Ambulatory Visit: Payer: Medicare Other | Admitting: Internal Medicine

## 2016-11-07 VITALS — BP 124/80 | HR 64 | Temp 97.2°F | Resp 18 | Ht 66.0 in | Wt 160.0 lb

## 2016-11-07 DIAGNOSIS — F419 Anxiety disorder, unspecified: Secondary | ICD-10-CM

## 2016-11-07 NOTE — Progress Notes (Signed)
      Patient presented for concern of possible side-effects of Lamisil. Patient took Lamisil ~ 6 week for moderate Onychomycosis of toenails with apparent significant improvement int the prox 1/3-1/2 Great toenails . After about 4 weeks he felt that he developed sx'd of anxiety and a couple of panic attacks that he attributed to the Lamisil after a computer search and stopped the Lamisil with his anxiety resolving. He was advised that this would be an extremely unusual SE.Marland Kitchen He was advise d to wait and see if the toenail fungus clears and if not,  could to a culture to evaluate and also r/o a candida nail infection.       Will re-evaluate at Sept and Dec OV's.   (N/C OV)

## 2016-11-08 ENCOUNTER — Encounter: Payer: Self-pay | Admitting: Internal Medicine

## 2016-12-07 ENCOUNTER — Ambulatory Visit (INDEPENDENT_AMBULATORY_CARE_PROVIDER_SITE_OTHER): Payer: Medicare Other | Admitting: Sports Medicine

## 2016-12-07 ENCOUNTER — Encounter: Payer: Self-pay | Admitting: Sports Medicine

## 2016-12-07 DIAGNOSIS — M722 Plantar fascial fibromatosis: Secondary | ICD-10-CM

## 2016-12-07 HISTORY — DX: Plantar fascial fibromatosis: M72.2

## 2016-12-07 MED ORDER — MELOXICAM 15 MG PO TABS: ORAL_TABLET | ORAL | 3 refills | Status: DC

## 2016-12-07 NOTE — Progress Notes (Signed)

## 2016-12-07 NOTE — Assessment & Plan Note (Signed)
Custom orthotics as above, rehabilitation exercises, meloxicam. Return as needed.
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
## 2016-12-29 ENCOUNTER — Encounter: Payer: Self-pay | Admitting: Physician Assistant

## 2016-12-29 ENCOUNTER — Ambulatory Visit (INDEPENDENT_AMBULATORY_CARE_PROVIDER_SITE_OTHER): Payer: Medicare Other | Admitting: Physician Assistant

## 2016-12-29 VITALS — BP 136/80 | HR 66 | Temp 97.9°F | Resp 16 | Ht 66.0 in | Wt 156.6 lb

## 2016-12-29 DIAGNOSIS — F419 Anxiety disorder, unspecified: Secondary | ICD-10-CM

## 2016-12-29 DIAGNOSIS — R0989 Other specified symptoms and signs involving the circulatory and respiratory systems: Secondary | ICD-10-CM

## 2016-12-29 DIAGNOSIS — E559 Vitamin D deficiency, unspecified: Secondary | ICD-10-CM

## 2016-12-29 DIAGNOSIS — E782 Mixed hyperlipidemia: Secondary | ICD-10-CM | POA: Diagnosis not present

## 2016-12-29 DIAGNOSIS — Z79899 Other long term (current) drug therapy: Secondary | ICD-10-CM

## 2016-12-29 LAB — CBC WITH DIFFERENTIAL/PLATELET
Basophils Absolute: 30 cells/uL (ref 0–200)
Basophils Relative: 0.6 %
EOS ABS: 50 {cells}/uL (ref 15–500)
Eosinophils Relative: 1 %
HCT: 39 % (ref 38.5–50.0)
Hemoglobin: 13.3 g/dL (ref 13.2–17.1)
Lymphs Abs: 905 cells/uL (ref 850–3900)
MCH: 30.1 pg (ref 27.0–33.0)
MCHC: 34.1 g/dL (ref 32.0–36.0)
MCV: 88.2 fL (ref 80.0–100.0)
MPV: 10.2 fL (ref 7.5–12.5)
Monocytes Relative: 6.8 %
NEUTROS PCT: 73.5 %
Neutro Abs: 3675 cells/uL (ref 1500–7800)
PLATELETS: 232 10*3/uL (ref 140–400)
RBC: 4.42 10*6/uL (ref 4.20–5.80)
RDW: 12.9 % (ref 11.0–15.0)
TOTAL LYMPHOCYTE: 18.1 %
WBC mixed population: 340 cells/uL (ref 200–950)
WBC: 5 10*3/uL (ref 3.8–10.8)

## 2016-12-29 LAB — BASIC METABOLIC PANEL WITH GFR
BUN: 18 mg/dL (ref 7–25)
CHLORIDE: 106 mmol/L (ref 98–110)
CO2: 26 mmol/L (ref 20–32)
Calcium: 9.6 mg/dL (ref 8.6–10.3)
Creat: 1.04 mg/dL (ref 0.70–1.25)
GFR, Est African American: 86 mL/min/{1.73_m2} (ref >=60)
GFR, Est Non African American: 74 mL/min/{1.73_m2} (ref >=60)
GLUCOSE: 93 mg/dL (ref 65–99)
Potassium: 3.9 mmol/L (ref 3.5–5.3)
SODIUM: 140 mmol/L (ref 135–146)

## 2016-12-29 LAB — LIPID PANEL
CHOL/HDL RATIO: 2.8 (calc) (ref <5.0)
CHOLESTEROL: 152 mg/dL (ref <200)
HDL: 54 mg/dL (ref >40)
LDL Cholesterol (Calc): 82 mg/dL (calc)
Non-HDL Cholesterol (Calc): 98 mg/dL (calc) (ref <130)
TRIGLYCERIDES: 79 mg/dL (ref <150)

## 2016-12-29 LAB — HEPATIC FUNCTION PANEL
AG Ratio: 1.7 (calc) (ref 1.0–2.5)
ALKALINE PHOSPHATASE (APISO): 50 U/L (ref 40–115)
ALT: 20 U/L (ref 9–46)
AST: 23 U/L (ref 10–35)
Albumin: 4.4 g/dL (ref 3.6–5.1)
Bilirubin, Direct: 0.2 mg/dL (ref 0.0–0.2)
Globulin: 2.6 g/dL (calc) (ref 1.9–3.7)
Indirect Bilirubin: 0.7 mg/dL (calc) (ref 0.2–1.2)
TOTAL PROTEIN: 7 g/dL (ref 6.1–8.1)
Total Bilirubin: 0.9 mg/dL (ref 0.2–1.2)

## 2016-12-29 LAB — TSH: TSH: 1.65 m[IU]/L (ref 0.40–4.50)

## 2016-12-29 LAB — MAGNESIUM: Magnesium: 2.1 mg/dL (ref 1.5–2.5)

## 2016-12-29 MED ORDER — SERTRALINE HCL 100 MG PO TABS: 100.0000 mg | ORAL_TABLET | Freq: Every day | ORAL | 1 refills | Status: DC

## 2016-12-29 MED ORDER — ALPRAZOLAM 0.5 MG PO TABS: ORAL_TABLET | ORAL | 0 refills | Status: DC

## 2016-12-29 NOTE — Patient Instructions (Signed)
Check out cognitive behavioral therapy Can take xanax AS needed for anxiety Increase zoloft to 100mg  daily, can cut in half later if anxiety decreases   Generalized Anxiety Disorder, Adult Generalized anxiety disorder (GAD) is a mental health disorder. People with this condition constantly worry about everyday events. Unlike normal anxiety, worry related to GAD is not triggered by a specific event. These worries also do not fade or get better with time. GAD interferes with life functions, including relationships, work, and school. GAD can vary from mild to severe. People with severe GAD can have intense waves of anxiety with physical symptoms (panic attacks). What are the causes? The exact cause of GAD is not known. What increases the risk? This condition is more likely to develop in:  Women.  People who have a family history of anxiety disorders.  People who are very shy.  People who experience very stressful life events, such as the death of a loved one.  People who have a very stressful family environment.  What are the signs or symptoms? People with GAD often worry excessively about many things in their lives, such as their health and family. They may also be overly concerned about:  Doing well at work.  Being on time.  Natural disasters.  Friendships.  Physical symptoms of GAD include:  Fatigue.  Muscle tension or having muscle twitches.  Trembling or feeling shaky.  Being easily startled.  Feeling like your heart is pounding or racing.  Feeling out of breath or like you cannot take a deep breath.  Having trouble falling asleep or staying asleep.  Sweating.  Nausea, diarrhea, or irritable bowel syndrome (IBS).  Headaches.  Trouble concentrating or remembering facts.  Restlessness.  Irritability.  How is this diagnosed? Your health care provider can diagnose GAD based on your symptoms and medical history. You will also have a physical exam. The  health care provider will ask specific questions about your symptoms, including how severe they are, when they started, and if they come and go. Your health care provider may ask you about your use of alcohol or drugs, including prescription medicines. Your health care provider may refer you to a mental health specialist for further evaluation. Your health care provider will do a thorough examination and may perform additional tests to rule out other possible causes of your symptoms. To be diagnosed with GAD, a person must have anxiety that:  Is out of his or her control.  Affects several different aspects of his or her life, such as work and relationships.  Causes distress that makes him or her unable to take part in normal activities.  Includes at least three physical symptoms of GAD, such as restlessness, fatigue, trouble concentrating, irritability, muscle tension, or sleep problems.  Before your health care provider can confirm a diagnosis of GAD, these symptoms must be present more days than they are not, and they must last for six months or longer. How is this treated? The following therapies are usually used to treat GAD:  Medicine. Antidepressant medicine is usually prescribed for long-term daily control. Antianxiety medicines may be added in severe cases, especially when panic attacks occur.  Talk therapy (psychotherapy). Certain types of talk therapy can be helpful in treating GAD by providing support, education, and guidance. Options include: ? Cognitive behavioral therapy (CBT). People learn coping skills and techniques to ease their anxiety. They learn to identify unrealistic or negative thoughts and behaviors and to replace them with positive ones. ? Acceptance and commitment  therapy (ACT). This treatment teaches people how to be mindful as a way to cope with unwanted thoughts and feelings. ? Biofeedback. This process trains you to manage your body's response (physiological  response) through breathing techniques and relaxation methods. You will work with a therapist while machines are used to monitor your physical symptoms.  Stress management techniques. These include yoga, meditation, and exercise.  A mental health specialist can help determine which treatment is best for you. Some people see improvement with one type of therapy. However, other people require a combination of therapies. Follow these instructions at home:  Take over-the-counter and prescription medicines only as told by your health care provider.  Try to maintain a normal routine.  Try to anticipate stressful situations and allow extra time to manage them.  Practice any stress management or self-calming techniques as taught by your health care provider.  Do not punish yourself for setbacks or for not making progress.  Try to recognize your accomplishments, even if they are small.  Keep all follow-up visits as told by your health care provider. This is important. Contact a health care provider if:  Your symptoms do not get better.  Your symptoms get worse.  You have signs of depression, such as: ? A persistently sad, cranky, or irritable mood. ? Loss of enjoyment in activities that used to bring you joy. ? Change in weight or eating. ? Changes in sleeping habits. ? Avoiding friends or family members. ? Loss of energy for normal tasks. ? Feelings of guilt or worthlessness. Get help right away if:  You have serious thoughts about hurting yourself or others. If you ever feel like you may hurt yourself or others, or have thoughts about taking your own life, get help right away. You can go to your nearest emergency department or call:  Your local emergency services (911 in the [place]  A suicide crisis helpline, such as the [place] at 325-138-9796. This is open 24 hours a day.  Summary  Generalized anxiety disorder (GAD) is a mental health disorder  that involves worry that is not triggered by a specific event.  People with GAD often worry excessively about many things in their lives, such as their health and family.  GAD may cause physical symptoms such as restlessness, trouble concentrating, sleep problems, frequent sweating, nausea, diarrhea, headaches, and trembling or muscle twitching.  A mental health specialist can help determine which treatment is best for you. Some people see improvement with one type of therapy. However, other people require a combination of therapies. This information is not intended to replace advice given to you by your health care provider. Make sure you discuss any questions you have with your health care provider. Document Released: 07/29/2012 Document Revised: 02/22/2016 Document Reviewed: 02/22/2016 Elsevier Interactive Patient Education  [employer].

## 2017-01-03 ENCOUNTER — Ambulatory Visit (INDEPENDENT_AMBULATORY_CARE_PROVIDER_SITE_OTHER): Payer: Medicare Other

## 2017-01-03 DIAGNOSIS — Z23 Encounter for immunization: Secondary | ICD-10-CM | POA: Diagnosis not present

## 2017-01-03 NOTE — Progress Notes (Signed)
Pt presents for Tetanus vaccine & at this the was informed of the cost of the TD $62.15. Pt reports he has 2 insurances & if they don't pay then he will pay out of pocket. Injection was given w/o any issues or concerns at this time.

## 2017-01-19 ENCOUNTER — Encounter: Payer: Self-pay | Admitting: Internal Medicine

## 2017-01-19 MED ORDER — ROSUVASTATIN CALCIUM 40 MG PO TABS: ORAL_TABLET | ORAL | 5 refills | Status: DC

## 2017-02-02 ENCOUNTER — Encounter: Payer: Self-pay | Admitting: Internal Medicine

## 2017-04-02 ENCOUNTER — Encounter: Payer: Self-pay | Admitting: Internal Medicine

## 2017-04-02 ENCOUNTER — Ambulatory Visit (INDEPENDENT_AMBULATORY_CARE_PROVIDER_SITE_OTHER): Payer: Medicare Other | Admitting: Internal Medicine

## 2017-04-02 VITALS — BP 122/80 | HR 56 | Temp 97.0°F | Resp 16 | Ht 66.5 in | Wt 150.0 lb

## 2017-04-02 DIAGNOSIS — Z1212 Encounter for screening for malignant neoplasm of rectum: Secondary | ICD-10-CM

## 2017-04-02 DIAGNOSIS — Z136 Encounter for screening for cardiovascular disorders: Secondary | ICD-10-CM

## 2017-04-02 DIAGNOSIS — E559 Vitamin D deficiency, unspecified: Secondary | ICD-10-CM

## 2017-04-02 DIAGNOSIS — Z125 Encounter for screening for malignant neoplasm of prostate: Secondary | ICD-10-CM

## 2017-04-02 DIAGNOSIS — E782 Mixed hyperlipidemia: Secondary | ICD-10-CM

## 2017-04-02 DIAGNOSIS — R0989 Other specified symptoms and signs involving the circulatory and respiratory systems: Secondary | ICD-10-CM | POA: Diagnosis not present

## 2017-04-02 DIAGNOSIS — Z79899 Other long term (current) drug therapy: Secondary | ICD-10-CM

## 2017-04-02 DIAGNOSIS — I10 Essential (primary) hypertension: Secondary | ICD-10-CM | POA: Diagnosis not present

## 2017-04-02 DIAGNOSIS — R7309 Other abnormal glucose: Secondary | ICD-10-CM

## 2017-04-02 DIAGNOSIS — N401 Enlarged prostate with lower urinary tract symptoms: Secondary | ICD-10-CM

## 2017-04-02 DIAGNOSIS — Z1211 Encounter for screening for malignant neoplasm of colon: Secondary | ICD-10-CM

## 2017-04-02 MED ORDER — FISH OIL 1000 MG PO CAPS: ORAL_CAPSULE | ORAL | 0 refills | Status: AC

## 2017-04-02 MED ORDER — VITAMIN D3 125 MCG (5000 UT) PO CAPS: ORAL_CAPSULE | ORAL | Status: DC

## 2017-04-02 NOTE — Patient Instructions (Signed)

## 2017-04-02 NOTE — Progress Notes (Signed)
[facility]   Unk Pinto, M.D.     Uvaldo Bristle. Silverio Lay, [place], [place]                [address] [place] [address] Telephone [phone] Telefax [phone] Comprehensive Evaluation & Examination     This very nice 66 y.o. [name] presents for a  comprehensive evaluation and management of multiple medical co-morbidities.  Patient has been followed for HTN, Prediabetes, Hyperlipidemia and Vitamin D Deficiency.     Patient is followed expectantly for Labile HTN. Patient's BP has been controlled at home.  Today's BP is at goal - 122/80. Patient denies any cardiac symptoms as chest pain, palpitations, shortness of breath, dizziness or ankle swelling.     Patient's hyperlipidemia (2012) is not controlled with diet and medications. Patient denies myalgias or other medication SE's. Last lipids were not at goal: Lab Results  Component Value Date   CHOL 190 04/02/2017   HDL 55 04/02/2017   LDLCALC 142 (H) 09/08/2016   TRIG 206 (H) 04/02/2017   CHOLHDL 3.5 04/02/2017      Patient is screened for abnormal glucose & prediabetes and patient denies reactive hypoglycemic symptoms, visual blurring, diabetic polys or paresthesias. Last A1c was normal & at goal: Lab Results  Component Value Date   HGBA1C 5.5 09/08/2016       Finally, patient has history of Vitamin D Deficiency ("29" in 2017) and last vitamin D was at goal: Lab Results  Component Value Date   VD25OH 87 09/08/2016   Current Outpatient Medications on File Prior to Visit  Medication Sig  . aspirin EC 81 MG tablet Take 81 mg by mouth daily.  Marland Kitchen EPINEPHrine (EPIPEN) 0.3 mg/0.3 mL SOAJ injection Inject 0.3 mg into the muscle once.  . Flaxseed, Linseed, (FLAXSEED OIL) 1000 MG CAPS Take 2 capsules by mouth daily.  . meloxicam (MOBIC) 15 MG tablet One tab PO qAM with breakfast for 2 weeks, then daily prn pain.  .  rosuvastatin (CRESTOR) 40 MG tablet Take 1/2 to 1 tablet daily or as directed for Cholesterol  . sertraline (ZOLOFT) 100 MG tablet Take 1 tablet (100 mg total) by mouth daily.   No current facility-administered medications on file prior to visit.    Allergies  Allergen Reactions  . Other     Bee stings   Past Medical History:  Diagnosis Date  . GERD (gastroesophageal reflux disease)   . Hypercholesterolemia   . Hypertension    labile   . Vitamin D deficiency    Health Maintenance  Topic Date Due  . Hepatitis C Screening  1950/12/02  . PNA vac Low Risk Adult (1 of 2 - PCV13) 04/02/2018 (Originally 06/03/2015)  . COLONOSCOPY  11/15/2025  . TETANUS/TDAP  01/04/2027  . INFLUENZA VACCINE  Discontinued   Immunization History  Administered Date(s) Administered  . Td 01/03/2017   Last Colon -  Past Surgical History:  Procedure Laterality Date  . COLONOSCOPY N/A 2002,2007,2012  . ROTATOR CUFF REPAIR Right 2015   History reviewed. No pertinent family history. Social History   Socioeconomic History  . Marital status: Married    Spouse name: Neoma Laming  . Number of children: 1 Daughter and 1 GrDaughter  Occupational History  . Retired [employer]  Tobacco Use  . Smoking status: Never Smoker  . Smokeless tobacco: Never  Used  Substance and Sexual Activity  . Alcohol use: Yes    Comment: drinks 2-3 beers a week  . Drug use: No    ROS Constitutional: Denies fever, chills, weight loss/gain, headaches, insomnia,  night sweats or change in appetite. Does c/o fatigue. Eyes: Denies redness, blurred vision, diplopia, discharge, itchy or watery eyes.  ENT: Denies discharge, congestion, post nasal drip, epistaxis, sore throat, earache, hearing loss, dental pain, Tinnitus, Vertigo, Sinus pain or snoring.  Cardio: Denies chest pain, palpitations, irregular heartbeat, syncope, dyspnea, diaphoresis, orthopnea, PND, claudication or edema Respiratory: denies cough, dyspnea, DOE, pleurisy,  hoarseness, laryngitis or wheezing.  Gastrointestinal: Denies dysphagia, heartburn, reflux, water brash, pain, cramps, nausea, vomiting, bloating, diarrhea, constipation, hematemesis, melena, hematochezia, jaundice or hemorrhoids Genitourinary: Denies dysuria, frequency, discharge, hematuria or flank pain. Has occas hesitancy, urgency, nocturia and decreased stream.  Musculoskeletal: Denies arthralgia, myalgia, stiffness, Jt. Swelling, pain, limp or strain/sprain. Denies Falls. Skin: Denies puritis, rash, hives, warts, acne, eczema or change in skin lesion Neuro: No weakness, tremor, incoordination, spasms, paresthesia or pain Psychiatric: Denies confusion, memory loss or sensory loss. Denies Depression. Endocrine: Denies change in weight, skin, hair change, nocturia, and paresthesia, diabetic polys, visual blurring or hyper / hypo glycemic episodes.  Heme/Lymph: No excessive bleeding, bruising or enlarged lymph nodes.  Physical Exam  BP 122/80   Pulse (!) 56   Temp (!) 97 F (36.1 C)   Resp 16   Ht 5' 6.5" (1.689 m)   Wt 150 lb (68 kg)   BMI 23.85 kg/m   General Appearance: Well nourished and well groomed and in no apparent distress.  Eyes: PERRLA, EOMs, conjunctiva no swelling or erythema, normal fundi and vessels. Sinuses: No frontal/maxillary tenderness ENT/Mouth: EACs patent / TMs  nl. Nares clear without erythema, swelling, mucoid exudates. Oral hygiene is good. No erythema, swelling, or exudate. Tongue normal, non-obstructing. Tonsils not swollen or erythematous. Hearing normal.  Neck: Supple, thyroid normal. No bruits, nodes or JVD. Respiratory: Respiratory effort normal.  BS equal and clear bilateral without rales, rhonci, wheezing or stridor. Cardio: Heart sounds are normal with regular rate and rhythm and no murmurs, rubs or gallops. Peripheral pulses are normal and equal bilaterally without edema. No aortic or femoral bruits. Chest: symmetric with normal excursions and  percussion.  Abdomen: Soft, with Nl bowel sounds. Nontender, no guarding, rebound, hernias, masses, or organomegaly.  Lymphatics: Non tender without lymphadenopathy.  Genitourinary: No hernias.Testes nl. DRE - prostate nl for age - smooth & firm w/o nodules. Musculoskeletal: Full ROM all peripheral extremities, joint stability, 5/5 strength, and normal gait. Skin: Warm and dry without rashes, lesions, cyanosis, clubbing or  ecchymosis.  Neuro: Cranial nerves intact, reflexes equal bilaterally. Normal muscle tone, no cerebellar symptoms. Sensation intact.  Pysch: Alert and oriented X 3 with normal affect, insight and judgment appropriate.   Assessment and Plan  1. Labile hypertension  - EKG 12-Lead - [place], RETROPERITNL ABD,  LTD - Urinalysis, Routine w reflex microscopic - Microalbumin / creatinine urine ratio - CBC with Differential/Platelet - BASIC METABOLIC PANEL WITH GFR - Magnesium - TSH  2. Hyperlipidemia, mixed  - EKG 12-Lead - [place], RETROPERITNL ABD,  LTD - Hepatic function panel - Lipid panel - TSH  3. Other abnormal glucose  - EKG 12-Lead - [place], RETROPERITNL ABD,  LTD - Hemoglobin A1c - Insulin, random  4. Vitamin D deficiency  - VITAMIN D 25 Hydroxy Level  - Cholecalciferol (VITAMIN D3) 5000 units CAPS; Takes 2 caps (10,000 Units) daily  5. Screening for colorectal cancer  - POC Hemoccult Bld/Stl   6. Prostate cancer screening  - PSA  7. Screening for ischemic heart disease  - EKG 12-Lead  8. Screening for AAA (aortic abdominal aneurysm)  - [place], RETROPERITNL ABD,  LTD  9. Medication management  - Urinalysis, Routine w reflex microscopic - Microalbumin / creatinine urine ratio - CBC with Differential/Platelet - BASIC METABOLIC PANEL WITH GFR - Hepatic function panel - Magnesium - Lipid panel - TSH - Hemoglobin A1c - Insulin, random - VITAMIN D 25 Hydroxy   10. Benign localized prostatic hyperplasia with lower urinary tract symptoms  (LUTS)  - PSA      Patient was counseled in prudent diet, weight control to achieve/maintain BMI less than 25, BP monitoring, regular exercise and medications as discussed.  Discussed med effects and SE's. Routine screening labs and tests as requested with regular follow-up as recommended. Over 40 minutes of exam, counseling, chart review and high complex critical decision making was performed

## 2017-04-03 LAB — HEPATIC FUNCTION PANEL
AG RATIO: 1.6 (calc) (ref 1.0–2.5)
ALKALINE PHOSPHATASE (APISO): 53 U/L (ref 40–115)
ALT: 25 U/L (ref 9–46)
AST: 20 U/L (ref 10–35)
Albumin: 4.4 g/dL (ref 3.6–5.1)
BILIRUBIN DIRECT: 0.1 mg/dL (ref 0.0–0.2)
BILIRUBIN INDIRECT: 0.5 mg/dL (ref 0.2–1.2)
GLOBULIN: 2.7 g/dL (ref 1.9–3.7)
TOTAL PROTEIN: 7.1 g/dL (ref 6.1–8.1)
Total Bilirubin: 0.6 mg/dL (ref 0.2–1.2)

## 2017-04-03 LAB — URINALYSIS, ROUTINE W REFLEX MICROSCOPIC
Bilirubin Urine: NEGATIVE
GLUCOSE, UA: NEGATIVE
HGB URINE DIPSTICK: NEGATIVE
Ketones, ur: NEGATIVE
LEUKOCYTES UA: NEGATIVE
NITRITE: NEGATIVE
PH: 7 (ref 5.0–8.0)
Protein, ur: NEGATIVE
SPECIFIC GRAVITY, URINE: 1.012 (ref 1.001–1.03)

## 2017-04-03 LAB — CBC WITH DIFFERENTIAL/PLATELET
BASOS ABS: 39 {cells}/uL (ref 0–200)
Basophils Relative: 0.7 %
EOS ABS: 78 {cells}/uL (ref 15–500)
Eosinophils Relative: 1.4 %
HEMATOCRIT: 42.2 % (ref 38.5–50.0)
HEMOGLOBIN: 14.3 g/dL (ref 13.2–17.1)
LYMPHS ABS: 890 {cells}/uL (ref 850–3900)
MCH: 30.2 pg (ref 27.0–33.0)
MCHC: 33.9 g/dL (ref 32.0–36.0)
MCV: 89 fL (ref 80.0–100.0)
MPV: 9.9 fL (ref 7.5–12.5)
Monocytes Relative: 5.9 %
NEUTROS ABS: 4262 {cells}/uL (ref 1500–7800)
Neutrophils Relative %: 76.1 %
Platelets: 305 10*3/uL (ref 140–400)
RBC: 4.74 10*6/uL (ref 4.20–5.80)
RDW: 12.5 % (ref 11.0–15.0)
Total Lymphocyte: 15.9 %
WBC mixed population: 330 cells/uL (ref 200–950)
WBC: 5.6 10*3/uL (ref 3.8–10.8)

## 2017-04-03 LAB — BASIC METABOLIC PANEL WITH GFR
BUN: 16 mg/dL (ref 7–25)
CHLORIDE: 104 mmol/L (ref 98–110)
CO2: 32 mmol/L (ref 20–32)
CREATININE: 0.98 mg/dL (ref 0.70–1.25)
Calcium: 10.2 mg/dL (ref 8.6–10.3)
GFR, EST AFRICAN AMERICAN: 93 mL/min/{1.73_m2} (ref >=60)
GFR, Est Non African American: 80 mL/min/{1.73_m2} (ref >=60)
GLUCOSE: 85 mg/dL (ref 65–99)
Potassium: 4.2 mmol/L (ref 3.5–5.3)
SODIUM: 141 mmol/L (ref 135–146)

## 2017-04-03 LAB — TSH: TSH: 1.87 m[IU]/L (ref 0.40–4.50)

## 2017-04-03 LAB — LIPID PANEL
CHOL/HDL RATIO: 3.5 (calc) (ref <5.0)
Cholesterol: 190 mg/dL (ref <200)
HDL: 55 mg/dL (ref >40)
LDL CHOLESTEROL (CALC): 103 mg/dL — AB
Non-HDL Cholesterol (Calc): 135 mg/dL (calc) — ABNORMAL HIGH (ref <130)
Triglycerides: 206 mg/dL — ABNORMAL HIGH (ref <150)

## 2017-04-03 LAB — MICROALBUMIN / CREATININE URINE RATIO
Creatinine, Urine: 79 mg/dL (ref 20–320)
Microalb Creat Ratio: 5 mcg/mg creat (ref <30)
Microalb, Ur: 0.4 mg/dL

## 2017-04-03 LAB — MAGNESIUM: Magnesium: 2 mg/dL (ref 1.5–2.5)

## 2017-04-03 LAB — VITAMIN D 25 HYDROXY (VIT D DEFICIENCY, FRACTURES): Vit D, 25-Hydroxy: 94 ng/mL (ref 30–100)

## 2017-04-03 LAB — HEMOGLOBIN A1C
EAG (MMOL/L): 6.6 (calc)
HEMOGLOBIN A1C: 5.8 %{Hb} — AB (ref <5.7)
MEAN PLASMA GLUCOSE: 120 (calc)

## 2017-04-03 LAB — PSA: PSA: 0.3 ng/mL (ref <=4.0)

## 2017-04-03 LAB — INSULIN, RANDOM: INSULIN: 5.5 u[IU]/mL (ref 2.0–19.6)

## 2017-04-12 ENCOUNTER — Encounter (INDEPENDENT_AMBULATORY_CARE_PROVIDER_SITE_OTHER): Payer: Self-pay

## 2017-04-13 ENCOUNTER — Other Ambulatory Visit: Payer: Self-pay | Admitting: Internal Medicine

## 2017-04-13 DIAGNOSIS — F419 Anxiety disorder, unspecified: Secondary | ICD-10-CM

## 2017-04-13 MED ORDER — SERTRALINE HCL 100 MG PO TABS: ORAL_TABLET | ORAL | 3 refills | Status: DC

## 2017-05-09 ENCOUNTER — Other Ambulatory Visit: Payer: Self-pay

## 2017-05-09 DIAGNOSIS — Z1212 Encounter for screening for malignant neoplasm of rectum: Secondary | ICD-10-CM | POA: Diagnosis not present

## 2017-05-09 DIAGNOSIS — Z1211 Encounter for screening for malignant neoplasm of colon: Secondary | ICD-10-CM

## 2017-05-09 LAB — POC HEMOCCULT BLD/STL (HOME/3-CARD/SCREEN)
Card #2 Fecal Occult Blod, POC: NEGATIVE
Card #3 Fecal Occult Blood, POC: NEGATIVE
FECAL OCCULT BLD: NEGATIVE

## 2017-07-07 DIAGNOSIS — Z79899 Other long term (current) drug therapy: Secondary | ICD-10-CM | POA: Insufficient documentation

## 2017-07-07 DIAGNOSIS — T7840XA Allergy, unspecified, initial encounter: Secondary | ICD-10-CM | POA: Insufficient documentation

## 2017-07-07 DIAGNOSIS — R7309 Other abnormal glucose: Secondary | ICD-10-CM | POA: Insufficient documentation

## 2017-07-07 NOTE — Progress Notes (Signed)
MEDICARE ANNUAL WELLNESS VISIT AND FOLLOW UP Assessment:   Diagnoses and all orders for this visit:  Encounter for Medicare annual wellness exam  Labile hypertension Currently at goal without medications Monitor blood pressure at home; call if consistently over 130/80 Continue DASH diet.   Reminder to go to the ER if any CP, SOB, nausea, dizziness, severe HA, changes vision/speech, left arm numbness and tingling and jaw pain.  Plantar fasciitis, right Continue orthotics, stretches, NSAIDs as needed Follow up with Dr. Dianah Field as needed  Vitamin D deficiency At goal at recent check; continue to recommend supplementation for goal of 70-100 Defer vitamin D level  Mixed hyperlipidemia Continue medications Continue low cholesterol diet and exercise.  Check lipid panel.   Other abnormal glucose Discussed disease and risks Discussed diet/exercise, weight management  Monitor A1C  Allergic state, sequela Has epi pen for bee stings; reminded to follow up at ER   BMI 23.0-23.9, adult Continue to recommend diet heavy in fruits and veggies and low in animal meats, cheeses, and dairy products, appropriate calorie intake Discuss exercise recommendations routinely Continue to monitor weight at each visit  Medication management Check CBC, BMP, LFTs as needed   Over 30 minutes of exam, counseling, chart review, and critical decision making was performed  Future Appointments  Date Time Provider [place]  10/09/2017  9:30 AM Unk Pinto, MD [facility] None  04/29/2018  9:00 AM Unk Pinto, MD [facility] None     Plan:   During the course of the visit the patient was educated and counseled about appropriate screening and preventive services including:    Pneumococcal vaccine   Influenza vaccine  Prevnar 13  Td vaccine  Screening electrocardiogram  Colorectal cancer screening  Diabetes screening  Glaucoma screening  Nutrition counseling     Subjective:  Francisco Dixon is a 67 y.o. male who presents for Medicare Annual Wellness Visit and 3 month follow up for HTN, hyperlipidemia, prediabetes, and vitamin D Def.   BMI is Body mass index is 24.64 kg/m., he has been working on diet and exercise. Wt Readings from Last 3 Encounters:  07/09/17 155 lb (70.3 kg)  04/02/17 150 lb (68 kg)  12/29/16 156 lb 9.6 oz (71 kg)   His blood pressure has been controlled at home, today their BP is BP: 110/74 He does workout. He denies chest pain, shortness of breath, dizziness.   He is on cholesterol medication (crestor 20 mg every other day) and denies myalgias. His cholesterol is not at goal. The cholesterol last visit was:   Lab Results  Component Value Date   CHOL 190 04/02/2017   HDL 55 04/02/2017   LDLCALC 103 (H) 04/02/2017   TRIG 206 (H) 04/02/2017   CHOLHDL 3.5 04/02/2017   He has been working on diet and exercise for prediabetes, and denies foot ulcerations, increased appetite, nausea, paresthesia of the feet, polydipsia, polyuria, visual disturbances, vomiting and weight loss. Last A1C in the office was:  Lab Results  Component Value Date   HGBA1C 5.8 (H) 04/02/2017   Last GFR Lab Results  Component Value Date   GFRNONAA 80 04/02/2017    Patient is on Vitamin D supplement and at goal at recent check:   Lab Results  Component Value Date   VD25OH 94 04/02/2017      Medication Review: Current Outpatient Medications on File Prior to Visit  Medication Sig Dispense Refill  . aspirin EC 81 MG tablet Take 81 mg by mouth daily.    Marland Kitchen  Cholecalciferol (VITAMIN D3) 5000 units CAPS Takes 2 caps (10,000 Units) daily    . EPINEPHrine (EPIPEN) 0.3 mg/0.3 mL SOAJ injection Inject 0.3 mg into the muscle once.    . Flaxseed, Linseed, (FLAXSEED OIL) 1000 MG CAPS Take 2 capsules by mouth daily.    . Omega-3 Fatty Acids (FISH OIL) 1000 MG CAPS Takes 1 capsule daily  0  . rosuvastatin (CRESTOR) 40 MG tablet Take 1/2 to 1 tablet daily  or as directed for Cholesterol 30 tablet 5  . sertraline (ZOLOFT) 100 MG tablet Take 1 tablet daily for Mood 90 tablet 3  . meloxicam (MOBIC) 15 MG tablet One tab PO qAM with breakfast for 2 weeks, then daily prn pain. (Patient not taking: Reported on 07/09/2017) 30 tablet 3   No current facility-administered medications on file prior to visit.     Allergies: Allergies  Allergen Reactions  . Other     Bee stings    Current Problems (verified) has Labile hypertension; Mixed hyperlipidemia; Vitamin D deficiency; Plantar fasciitis, right; Encounter for Medicare annual wellness exam; Other abnormal glucose; Allergy; and Major depression in full remission ([place]) on their problem list.  Screening Tests Immunization History  Administered Date(s) Administered  . Td 01/03/2017   Preventative care: Last colonoscopy:  August 2017  Prior vaccinations: TD or Tdap: 01/2017  Influenza: declines Pneumococcal: declines Prevnar13: declines Shingles/Zostavax: declines  Names of Other Physician/Practitioners you currently use: 1. [facility] here for primary care 2. [facility] [place], eye doctor, last visit 2017 3.  dentist, last visit 2019  Patient Care Team: Unk Pinto, MD as PCP - General (Internal Medicine)  Surgical: He  has a past surgical history that includes Colonoscopy (N/A, 2002,2007,2012) and Rotator cuff repair (Right, 2015). Family His family history is not on file. Social history  He reports that he has never smoked. He has never used smokeless tobacco. He reports that he drinks alcohol. He reports that he does not use drugs.  MEDICARE WELLNESS OBJECTIVES: Physical activity: Current Exercise Habits: Home exercise routine, Type of exercise: yoga;walking, Time (Minutes): 60, Frequency (Times/Week): 5, Weekly Exercise (Minutes/Week): 300, Intensity: Mild, Exercise limited by: None identified Cardiac risk factors: Cardiac Risk Factors  include: advanced age (>26men, >56 women);dyslipidemia;male gender;hypertension Depression/mood screen:   Depression screen [facility] 2/9 07/09/2017  Decreased Interest 0  Down, Depressed, Hopeless 0  PHQ - 2 Score 0    ADLs:  In your present state of health, do you have any difficulty performing the following activities: 07/09/2017 04/02/2017  Hearing? N N  Comment Bilateral hearing aids -  Vision? N N  Difficulty concentrating or making decisions? N N  Walking or climbing stairs? N N  Dressing or bathing? N N  Doing errands, shopping? N N  Some recent data might be hidden     Cognitive Testing  Alert? Yes  Normal Appearance?Yes  Oriented to person? Yes  Place? Yes   Time? Yes  Recall of three objects?  Yes  Can perform simple calculations? Yes  Displays appropriate judgment?Yes  Can read the correct time from a watch face?Yes  EOL planning: Does Patient Have a Medical Advance Directive?: Yes Type of Advance Directive: Healthcare Power of Attorney, Living will Does patient want to make changes to medical advance directive?: No - Patient declined Copy of [place] in Chart?: No - copy requested   Objective:   Today's Vitals   07/09/17 0849  BP: 110/74  Pulse: 69  Temp: (!) 97.5 F (36.4 C)  SpO2: 99%  Weight: 155 lb (70.3 kg)  Height: 5' 6.5" (1.689 m)  PainSc: 3   PainLoc: Back   Body mass index is 24.64 kg/m.  General appearance: alert, no distress, WD/WN, male HEENT: normocephalic, sclerae anicteric, TMs pearly, nares patent, no discharge or erythema, pharynx normal Oral cavity: MMM, no lesions Neck: supple, no lymphadenopathy, no thyromegaly, no masses Heart: RRR, normal S1, S2, no murmurs Lungs: CTA bilaterally, no wheezes, rhonchi, or rales Abdomen: +bs, soft, non tender, non distended, no masses, no hepatomegaly, no splenomegaly Musculoskeletal: nontender, no swelling, no obvious deformity Extremities: no edema, no cyanosis, no  clubbing Pulses: 2+ symmetric, upper and lower extremities, normal cap refill Neurological: alert, oriented x 3, CN2-12 intact, strength normal upper extremities and lower extremities, sensation normal throughout, DTRs 2+ throughout, no cerebellar signs, gait normal Psychiatric: normal affect, behavior normal, pleasant   Medicare Attestation I have personally reviewed: The patient's medical and social history Their use of alcohol, tobacco or illicit drugs Their current medications and supplements The patient's functional ability including ADLs,fall risks, home safety risks, cognitive, and hearing and visual impairment Diet and physical activities Evidence for depression or mood disorders  The patient's weight, height, BMI, and visual acuity have been recorded in the chart.  I have made referrals, counseling, and provided education to the patient based on review of the above and I have provided the patient with a written personalized care plan for preventive services.     Izora Ribas, NP   07/09/2017

## 2017-07-09 ENCOUNTER — Ambulatory Visit (INDEPENDENT_AMBULATORY_CARE_PROVIDER_SITE_OTHER): Payer: Medicare Other | Admitting: Adult Health

## 2017-07-09 ENCOUNTER — Encounter: Payer: Self-pay | Admitting: Adult Health

## 2017-07-09 VITALS — BP 110/74 | HR 69 | Temp 97.5°F | Ht 66.5 in | Wt 155.0 lb

## 2017-07-09 DIAGNOSIS — T7840XS Allergy, unspecified, sequela: Secondary | ICD-10-CM

## 2017-07-09 DIAGNOSIS — R6889 Other general symptoms and signs: Secondary | ICD-10-CM | POA: Diagnosis not present

## 2017-07-09 DIAGNOSIS — E559 Vitamin D deficiency, unspecified: Secondary | ICD-10-CM

## 2017-07-09 DIAGNOSIS — F3342 Major depressive disorder, recurrent, in full remission: Secondary | ICD-10-CM

## 2017-07-09 DIAGNOSIS — E782 Mixed hyperlipidemia: Secondary | ICD-10-CM | POA: Diagnosis not present

## 2017-07-09 DIAGNOSIS — Z6823 Body mass index (BMI) 23.0-23.9, adult: Secondary | ICD-10-CM

## 2017-07-09 DIAGNOSIS — M722 Plantar fascial fibromatosis: Secondary | ICD-10-CM

## 2017-07-09 DIAGNOSIS — R0989 Other specified symptoms and signs involving the circulatory and respiratory systems: Secondary | ICD-10-CM

## 2017-07-09 DIAGNOSIS — Z0001 Encounter for general adult medical examination with abnormal findings: Secondary | ICD-10-CM

## 2017-07-09 DIAGNOSIS — R7309 Other abnormal glucose: Secondary | ICD-10-CM

## 2017-07-09 DIAGNOSIS — Z79899 Other long term (current) drug therapy: Secondary | ICD-10-CM

## 2017-07-09 DIAGNOSIS — F325 Major depressive disorder, single episode, in full remission: Secondary | ICD-10-CM | POA: Insufficient documentation

## 2017-07-09 DIAGNOSIS — Z0000 Encounter for general adult medical examination without abnormal findings: Secondary | ICD-10-CM

## 2017-07-09 NOTE — Patient Instructions (Signed)
Aim for 7+ servings of fruits and vegetables daily  80+ fluid ounces of water or unsweet tea for healthy kidneys  Limit alcohol  Limit animal fats in diet for cholesterol and heart health - choose grass fed whenever available  Aim for low stress - take time to unwind and care for your mental health  Aim for 150 min of moderate intensity exercise weekly for heart health, and weights twice weekly for bone health  Aim for 7-9 hours of sleep daily      When it comes to diets, agreement about the perfect plan isn't easy to find, even among the experts. Experts at the [facility] developed an idea known as the Healthy Eating Plate. Just imagine a plate divided into logical, healthy portions.  The emphasis is on diet quality:  Load up on vegetables and fruits - one-half of your plate: Aim for color and variety, and remember that potatoes don't count.  Go for whole grains - one-quarter of your plate: Whole wheat, barley, wheat berries, quinoa, oats, brown rice, and foods made with them. If you want pasta, go with whole wheat pasta.  Protein power - one-quarter of your plate: Fish, chicken, beans, and nuts are all healthy, versatile protein sources. Limit red meat.  The diet, however, does go beyond the plate, offering a few other suggestions.  Use healthy plant oils, such as olive, canola, soy, corn, sunflower and peanut. Check the labels, and avoid partially hydrogenated oil, which have unhealthy trans fats.  If you're thirsty, drink water. Coffee and tea are good in moderation, but skip sugary drinks and limit milk and dairy products to one or two daily servings.  The type of carbohydrate in the diet is more important than the amount. Some sources of carbohydrates, such as vegetables, fruits, whole grains, and beans-are healthier than others.  Finally, stay active.

## 2017-07-10 LAB — CBC WITH DIFFERENTIAL/PLATELET
Basophils Absolute: 28 cells/uL (ref 0–200)
Basophils Relative: 0.7 %
EOS ABS: 140 {cells}/uL (ref 15–500)
Eosinophils Relative: 3.5 %
HCT: 42 % (ref 38.5–50.0)
Hemoglobin: 14.2 g/dL (ref 13.2–17.1)
Lymphs Abs: 1124 cells/uL (ref 850–3900)
MCH: 29.8 pg (ref 27.0–33.0)
MCHC: 33.8 g/dL (ref 32.0–36.0)
MCV: 88.1 fL (ref 80.0–100.0)
MPV: 9.9 fL (ref 7.5–12.5)
Monocytes Relative: 9.2 %
Neutro Abs: 2340 cells/uL (ref 1500–7800)
Neutrophils Relative %: 58.5 %
PLATELETS: 266 10*3/uL (ref 140–400)
RBC: 4.77 10*6/uL (ref 4.20–5.80)
RDW: 12.6 % (ref 11.0–15.0)
TOTAL LYMPHOCYTE: 28.1 %
WBC: 4 10*3/uL (ref 3.8–10.8)
WBCMIX: 368 {cells}/uL (ref 200–950)

## 2017-07-10 LAB — HEPATIC FUNCTION PANEL
AG Ratio: 1.6 (calc) (ref 1.0–2.5)
ALT: 19 U/L (ref 9–46)
AST: 19 U/L (ref 10–35)
Albumin: 4.4 g/dL (ref 3.6–5.1)
Alkaline phosphatase (APISO): 52 U/L (ref 40–115)
BILIRUBIN DIRECT: 0.1 mg/dL (ref 0.0–0.2)
BILIRUBIN INDIRECT: 0.4 mg/dL (ref 0.2–1.2)
GLOBULIN: 2.7 g/dL (ref 1.9–3.7)
Total Bilirubin: 0.5 mg/dL (ref 0.2–1.2)
Total Protein: 7.1 g/dL (ref 6.1–8.1)

## 2017-07-10 LAB — LIPID PANEL
CHOL/HDL RATIO: 3.4 (calc) (ref <5.0)
CHOLESTEROL: 171 mg/dL (ref <200)
HDL: 51 mg/dL (ref >40)
LDL Cholesterol (Calc): 96 mg/dL (calc)
Non-HDL Cholesterol (Calc): 120 mg/dL (calc) (ref <130)
Triglycerides: 144 mg/dL (ref <150)

## 2017-07-10 LAB — BASIC METABOLIC PANEL WITH GFR
BUN: 21 mg/dL (ref 7–25)
CHLORIDE: 107 mmol/L (ref 98–110)
CO2: 31 mmol/L (ref 20–32)
Calcium: 9.9 mg/dL (ref 8.6–10.3)
Creat: 0.97 mg/dL (ref 0.70–1.25)
GFR, EST AFRICAN AMERICAN: 93 mL/min/{1.73_m2} (ref >=60)
GFR, Est Non African American: 80 mL/min/{1.73_m2} (ref >=60)
Glucose, Bld: 65 mg/dL (ref 65–99)
POTASSIUM: 4.1 mmol/L (ref 3.5–5.3)
Sodium: 142 mmol/L (ref 135–146)

## 2017-07-10 LAB — TSH: TSH: 2.46 m[IU]/L (ref 0.40–4.50)

## 2017-07-10 LAB — HEMOGLOBIN A1C
EAG (MMOL/L): 6.5 (calc)
Hgb A1c MFr Bld: 5.7 % of total Hgb — ABNORMAL HIGH (ref <5.7)
Mean Plasma Glucose: 117 (calc)

## 2017-10-03 ENCOUNTER — Ambulatory Visit (INDEPENDENT_AMBULATORY_CARE_PROVIDER_SITE_OTHER): Payer: Medicare Other | Admitting: Internal Medicine

## 2017-10-03 ENCOUNTER — Encounter: Payer: Self-pay | Admitting: Internal Medicine

## 2017-10-03 VITALS — BP 106/72 | HR 60 | Temp 97.3°F | Resp 16 | Ht 66.5 in | Wt 154.6 lb

## 2017-10-03 DIAGNOSIS — R0989 Other specified symptoms and signs involving the circulatory and respiratory systems: Secondary | ICD-10-CM

## 2017-10-03 DIAGNOSIS — E559 Vitamin D deficiency, unspecified: Secondary | ICD-10-CM | POA: Diagnosis not present

## 2017-10-03 DIAGNOSIS — M2550 Pain in unspecified joint: Secondary | ICD-10-CM

## 2017-10-03 DIAGNOSIS — R5383 Other fatigue: Secondary | ICD-10-CM | POA: Diagnosis not present

## 2017-10-03 DIAGNOSIS — W57XXXA Bitten or stung by nonvenomous insect and other nonvenomous arthropods, initial encounter: Secondary | ICD-10-CM | POA: Diagnosis not present

## 2017-10-03 DIAGNOSIS — B351 Tinea unguium: Secondary | ICD-10-CM | POA: Diagnosis not present

## 2017-10-03 DIAGNOSIS — E782 Mixed hyperlipidemia: Secondary | ICD-10-CM

## 2017-10-03 DIAGNOSIS — M791 Myalgia, unspecified site: Secondary | ICD-10-CM

## 2017-10-03 DIAGNOSIS — R7309 Other abnormal glucose: Secondary | ICD-10-CM | POA: Diagnosis not present

## 2017-10-03 DIAGNOSIS — Z79899 Other long term (current) drug therapy: Secondary | ICD-10-CM | POA: Diagnosis not present

## 2017-10-03 MED ORDER — TERBINAFINE HCL 250 MG PO TABS: 250.0000 mg | ORAL_TABLET | Freq: Every day | ORAL | 0 refills | Status: DC

## 2017-10-03 NOTE — Patient Instructions (Signed)

## 2017-10-03 NOTE — Progress Notes (Signed)
Subjective:    Patient ID: Francisco Dixon, male    DOB: [birth year], 67 y.o.   MRN: [id number]  HPI   This very nice 67 yo [name] present with a vague 2-3 month prodrome of excessive fatigue, myalgias and arthralgias. Also c/o pain of lateral Left kneed along the joint line. Denies fevers, rash, CP, respiratory, cardiac , GI or GU sx's. He also reports depressed mood, low libido and issues w/ ED. He reports several tick bite & removal(s) over the last 2 months.  Medication Sig  . aspirin EC 81 MG tablet Take 81 mg by mouth daily.  . Cholecalciferol (VITAMIN D3) 5000 units CAPS Takes 2 caps (10,000 Units) daily  . EPINEPHrine (EPIPEN) 0.3 mg/0.3 mL SOAJ injection Inject 0.3 mg into the muscle once.  . Flaxseed, Linseed, (FLAXSEED OIL) 1000 MG CAPS Take 2 capsules by mouth daily.  . meloxicam (MOBIC) 15 MG tablet One tab PO qAM with breakfast for 2 weeks, then daily prn pain.  . Omega-3 Fatty Acids (FISH OIL) 1000 MG CAPS Takes 1 capsule daily  . rosuvastatin (CRESTOR) 40 MG tablet Take 1/2 to 1 tablet daily or as directed for Cholesterol  . sertraline (ZOLOFT) 100 MG tablet Take 1 tablet daily for Mood   No facility-administered medications prior to visit.    Allergies  Allergen Reactions  . Other     Bee stings   Past Medical History:  Diagnosis Date  . GERD (gastroesophageal reflux disease)   . Hypercholesterolemia   . Hypertension    labile   . Vitamin D deficiency    Past Surgical History:  Procedure Laterality Date  . COLONOSCOPY N/A 2002,2007,2012  . ROTATOR CUFF REPAIR Right 2015   Review of Systems  10 point systems review negative except as above.    Objective:   Physical Exam  BP 106/72   Pulse 60   Temp (!) 97.3 F (36.3 C)   Resp 16   Ht 5' 6.5" (1.689 m)   Wt 154 lb 9.6 oz (70.1 kg)   BMI 24.58 kg/m   No rash, cyanosis icterus. Skin warm & dry.   HEENT - WNL. Neck - supple.  Chest - Clear equal BS. Cor - Nl HS. RRR w/o sig MGR. PP 1(+). No edema. MS-  FROM w/o deformities.  Gait Nl.. Tender Lt knee lat joint  Skin - Thickened dystrophic toenails. Neuro -  Nl w/o focal abnormalities.    Assessment & Plan:   1. Labile hypertension  - CBC with Differential/Platelet - COMPLETE METABOLIC PANEL WITH GFR - Magnesium - TSH  2. Hyperlipidemia, mixed  - Lipid panel - TSH  3. Abnormal glucose  - Hemoglobin A1c - Insulin, random  4. Vitamin D deficiency  - VITAMIN D 25 Hydroxyl  5. Fatigue  - CK - Aldolase - Testosterone  6. Myalgia  - CK - Aldolase - Sedimentation rate - C-reactive protein  7. Tick bite  - B. burgdorfi antibodies - Ehrlichia antibody panel - Rocky mtn spotted fvr abs pnl(IgG+IgM)  8. Medication management  - CBC with Differential/Platelet - COMPLETE METABOLIC PANEL WITH GFR - Magnesium - Lipid panel - TSH - Hemoglobin A1c - Insulin, random - VITAMIN D 25 Hydroxyl  9. Arthralgia  - Sedimentation rate - C-reactive protein  10. Onychomycosis, toenails   - desires retreatment - terbinafine (LAMISIL) 250 MG tablet; Take 1 tablet  daily.  Dispense: 90 tablet; Refill: 0  Over 25 minutes of exam, counseling, chart review and  complex  critical decision making was performed.

## 2017-10-04 LAB — TESTOSTERONE: TESTOSTERONE: 367 ng/dL (ref 250–827)

## 2017-10-04 LAB — SEDIMENTATION RATE: Sed Rate: 6 mm/h (ref 0–20)

## 2017-10-04 LAB — C-REACTIVE PROTEIN: CRP: 1.3 mg/L (ref <8.0)

## 2017-10-08 ENCOUNTER — Other Ambulatory Visit: Payer: Self-pay | Admitting: Internal Medicine

## 2017-10-08 ENCOUNTER — Encounter: Payer: Self-pay | Admitting: Internal Medicine

## 2017-10-08 MED ORDER — BUPROPION HCL ER (XL) 150 MG PO TB24: ORAL_TABLET | ORAL | 1 refills | Status: DC

## 2017-10-09 ENCOUNTER — Ambulatory Visit: Payer: Self-pay | Admitting: Internal Medicine

## 2017-11-05 ENCOUNTER — Other Ambulatory Visit: Payer: Self-pay | Admitting: Internal Medicine

## 2017-11-05 ENCOUNTER — Other Ambulatory Visit: Payer: Medicare Other

## 2017-11-05 DIAGNOSIS — B351 Tinea unguium: Secondary | ICD-10-CM

## 2017-11-05 DIAGNOSIS — Z79899 Other long term (current) drug therapy: Secondary | ICD-10-CM

## 2017-11-05 LAB — HEPATIC FUNCTION PANEL
AG Ratio: 1.6 (calc) (ref 1.0–2.5)
ALKALINE PHOSPHATASE (APISO): 53 U/L (ref 40–115)
ALT: 21 U/L (ref 9–46)
AST: 19 U/L (ref 10–35)
Albumin: 4.5 g/dL (ref 3.6–5.1)
Bilirubin, Direct: 0.1 mg/dL (ref 0.0–0.2)
Globulin: 2.8 g/dL (calc) (ref 1.9–3.7)
Indirect Bilirubin: 0.6 mg/dL (calc) (ref 0.2–1.2)
TOTAL PROTEIN: 7.3 g/dL (ref 6.1–8.1)
Total Bilirubin: 0.7 mg/dL (ref 0.2–1.2)

## 2017-11-27 ENCOUNTER — Ambulatory Visit (INDEPENDENT_AMBULATORY_CARE_PROVIDER_SITE_OTHER): Payer: Medicare Other | Admitting: Sports Medicine

## 2017-11-27 ENCOUNTER — Encounter: Payer: Self-pay | Admitting: Sports Medicine

## 2017-11-27 ENCOUNTER — Ambulatory Visit (INDEPENDENT_AMBULATORY_CARE_PROVIDER_SITE_OTHER): Payer: Medicare Other

## 2017-11-27 DIAGNOSIS — M722 Plantar fascial fibromatosis: Secondary | ICD-10-CM | POA: Diagnosis not present

## 2017-11-27 DIAGNOSIS — R202 Paresthesia of skin: Secondary | ICD-10-CM

## 2017-11-27 DIAGNOSIS — R200 Anesthesia of skin: Secondary | ICD-10-CM

## 2017-11-27 DIAGNOSIS — M47816 Spondylosis without myelopathy or radiculopathy, lumbar region: Secondary | ICD-10-CM | POA: Diagnosis not present

## 2017-11-27 DIAGNOSIS — M5136 Other intervertebral disc degeneration, lumbar region: Secondary | ICD-10-CM | POA: Diagnosis not present

## 2017-11-27 DIAGNOSIS — M48061 Spinal stenosis, lumbar region without neurogenic claudication: Secondary | ICD-10-CM | POA: Diagnosis not present

## 2017-11-27 MED ORDER — GABAPENTIN 300 MG PO CAPS: ORAL_CAPSULE | ORAL | 3 refills | Status: DC

## 2017-11-27 NOTE — Assessment & Plan Note (Signed)
Mostly in the big toe, with back pain worse with sitting. Consistent with lumbar degenerative disc disease. X-rays, gabapentin, formal physical therapy.  Return to see me in 1 month, MR for interventional planning if no better.

## 2017-11-27 NOTE — Assessment & Plan Note (Signed)
New set of custom orthotics as above. 

## 2017-11-27 NOTE — Progress Notes (Signed)
    Patient was fitted for a : standard, cushioned, semi-rigid orthotic. The orthotic was heated and afterward the patient stood on the orthotic blank positioned on the orthotic stand. The patient was positioned in subtalar neutral position and 10 degrees of ankle dorsiflexion in a weight bearing stance. After completion of molding, a stable base was applied to the orthotic blank. The blank was ground to a stable position for weight bearing. Size: 10 Base: [employer] and safety inspector and [place]: None The patient ambulated these, and they were very comfortable.  I spent 40 minutes with this patient, greater than 50% was face-to-face time counseling regarding the below diagnosis.  Plantar fasciitis, right New set of custom orthotics as above.  Numbness and tingling of both feet Mostly in the big toe, with back pain worse with sitting. Consistent with lumbar degenerative disc disease. X-rays, gabapentin, formal physical therapy.  Return to see me in 1 month, MR for interventional planning if no better.  ___________________________________________ Gwen Her. Dianah Field, M.D., ABFM., CAQSM. Primary Care and [place] Instructor of [place] [facility] of Medicine

## 2017-11-29 ENCOUNTER — Encounter: Payer: Self-pay | Admitting: Rehabilitative and Restorative Service Providers"

## 2017-11-29 ENCOUNTER — Ambulatory Visit (INDEPENDENT_AMBULATORY_CARE_PROVIDER_SITE_OTHER): Payer: Medicare Other | Admitting: Rehabilitative and Restorative Service Providers"

## 2017-11-29 DIAGNOSIS — M25675 Stiffness of left foot, not elsewhere classified: Secondary | ICD-10-CM | POA: Diagnosis not present

## 2017-11-29 DIAGNOSIS — M25674 Stiffness of right foot, not elsewhere classified: Secondary | ICD-10-CM | POA: Diagnosis not present

## 2017-11-29 NOTE — Patient Instructions (Addendum)
Abdominal Bracing With Pelvic Floor (Hook-Lying)    With neutral spine, tighten pelvic floor and abdominals sucking belly button to back bone; tighten muscles in the low back at waist. Hold 10 sec  Repeat _10_ times. Do _several__ times a day. Progress to sitting; standing; walking and with functional activities     Trunk: Prone Extension (Press-Ups)    Lie on stomach on firm, flat surface. Relax bottom and legs. Raise chest in air with elbows straight. Keep hips flat on surface, sag stomach. Hold ___2-3_ seconds. Repeat __10__ times. Do __1-2__ sessions per day. CAUTION: Movement should be gentle and slow.   HIP: Hamstrings - Supine  Place strap around foot. Raise leg up, keeping knee straight.  Bend opposite knee to protect back if indicated. Hold 30 seconds. 3 reps per set, 2-3 sets per day  Outer Hip Stretch: Reclined IT Band Stretch (Strap)   Strap around one foot, pull leg across body until you feel a pull or stretch in the outside of your hip, with shoulders on mat. Hold for 30 seconds. Repeat 3 times each leg. 2-3 times/day.  Piriformis Stretch   Lying on back, pull right knee toward opposite shoulder. Hold 30 seconds. Can vary angles for stretch across hips  Repeat 3 times. Do 2-3 sessions per day.  Gastroc Stretch    Stand with right foot back, leg straight, forward leg bent. Keeping heel on floor, turned slightly out, lean into wall until stretch is felt in calf. Hold __30__ seconds. Repeat __3__ times per set. Do __2-3__ sets per day.   Achilles / Soleus, Standing    Stand, right foot behind, heel on floor and turned slightly out. Lower hips and bend knees. Hold _30__ seconds. Repeat _3__ times per session. Do __2-3_ sessions per day.   Stretch each joint of toes down and hold 15-20 and then bend all joints down together and hold 20-30 sec  3-5 reps each   Move bones of the ball of foot   Ankle Dorsiflexion / Toe Extensors, Standing    Stand,  back knee facing forward, toes in line with knee. Gently press front of back foot and ankle toward floor. Hold _30__ seconds. Repeat _3-5__ times per session. Do _1-2__ sessions per day.    TENS UNIT: This is helpful for muscle pain and spasm.   Search and Purchase a TENS 7000 2nd edition at www.tenspros.com. It should be less than $30.     TENS unit instructions: Do not shower or bathe with the unit on Turn the unit off before removing electrodes or batteries If the electrodes lose stickiness add a drop of water to the electrodes after they are disconnected from the unit and place on plastic sheet. If you continued to have difficulty, call the TENS unit company to purchase more electrodes. Do not apply lotion on the skin area prior to use. Make sure the skin is clean and dry as this will help prolong the life of the electrodes. After use, always check skin for unusual red areas, rash or other skin difficulties. If there are any skin problems, does not apply electrodes to the same area. Never remove the electrodes from the unit by pulling the wires. Do not use the TENS unit or electrodes other than as directed. Do not change electrode placement without consultating your therapist or physician. Keep 2 fingers with between each electrode.

## 2017-11-29 NOTE — Therapy (Addendum)
[facility] [place], [place], [address] Phone: [phone]   Fax:  [phone]  Physical Therapy Evaluation  Patient Details  Name: Francisco Dixon MRN: [id number] Date of Birth: November 30, 1950 Referring Provider: Dr Dianah Field   Encounter Date: 11/29/2017  PT End of Session - 11/29/17 1401    Visit Number  1    Number of Visits  6    Date for PT Re-Evaluation  01/10/18    PT Start Time  1401    PT Stop Time  1449    PT Time Calculation (min)  48 min    Activity Tolerance  Patient tolerated treatment well       Past Medical History:  Diagnosis Date  . GERD (gastroesophageal reflux disease)   . Hypercholesterolemia   . Hypertension    labile   . Vitamin D deficiency     Past Surgical History:  Procedure Laterality Date  . COLONOSCOPY N/A 2002,2007,2012  . ROTATOR CUFF REPAIR Right 2015    There were no vitals filed for this visit.   Subjective Assessment - 11/29/17 1404    Subjective  Patient reports that his plantar fasciitis is improved with inserts ~ 1 yr ago. His problem is buring in the feet. History of tingling and numbness in both feet over the past 10 years with physical activities including hiking and biking     Pertinent History  PT for his back several times with no change. Burning in the mid back with prolonged sitting; denies any other medical problems     Diagnostic tests  xrays - lumbar spine narrowing in the L4 area     Patient Stated Goals  get rid of the tinging and numbness in the feet     Currently in Pain?  No/denies         [facility] PT Assessment - 11/29/17 0001      Assessment   Medical Diagnosis  tingling and numbness bilat feet    Referring Provider  Dr Dianah Field    Onset Date/Surgical Date  09/15/16   continuous for the past 1-2 yrs    Hand Dominance  Right    Next MD Visit  12/25/17    Prior Therapy  yes for back pain       Precautions   Precautions  None       Balance Screen   Has the patient fallen in the past 6 months  No    Has the patient had a decrease in activity level because of a fear of falling?   No    Is the patient reluctant to leave their home because of a fear of falling?   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; Marine scientist; some Architect; walking 3 x/wk 45-60 min; yoga 2x/wk       Sensation   Additional Comments  tingling and numbness in bilat feet from midfoot to toes more plantar surface       Posture/Postural Control   Posture Comments  head forward; shoulders rounded and elevated decreased lumbar lordosis       AROM   Overall AROM Comments  significant tightness through forefeet and toes with decreased toe flexion especially through MP joints     Right/Left Hip  --   WNL's  bilat hips    Lumbar Flexion  805    Lumbar Extension  60%    Lumbar - Right Side Bend  70%    Lumbar - Left Side Bend  70%    Lumbar - Right Rotation  55%    Lumbar - Left Rotation  55%      Strength   Overall Strength Comments  5/5 bilat LE's       Flexibility   Hamstrings  tight bilat 75 deg    Quadriceps  tight bilat    ITB  mild tightness    Piriformis  tight bilat       Palpation   Spinal mobility  hypomobile lower lumbar spine with CPA mobs    Palpation comment  tightness bilat posterior lateral hips piriformis/glut med                 Objective measurements completed on examination: See above findings.      [facility] PT Treatment/Exercise - 11/29/17 0001      Self-Care   Self-Care  --   education re- consistent exercise and chronic conditions      Exercises   Exercises  --   see home exercise program      Manual Therapy   Manual therapy comments  pt instructed in mobilization of feet and PROM/stretching toes and forefoot              PT Education - 11/29/17 1446     Education Details  HEP TENs     Person(s) Educated  Patient    Methods  Explanation;Demonstration;Tactile cues;Verbal cues;Handout    Comprehension  Verbalized understanding;Returned demonstration;Verbal cues required;Tactile cues required          PT Long Term Goals - 11/29/17 1502      PT LONG TERM GOAL #1   Title  Instruct patient in HEP to address tightness bilat LE, especially feet and toes 01/10/18    Time  6    Period  Weeks    Status  New      PT LONG TERM GOAL #2   Title  Progress exercise as indicated 01/10/18    Time  6    Period  Weeks    Status  New      PT LONG TERM GOAL #3   Title  Trial of joint mobilization and PROM/stretching bilat feet with instruction for patient in same 01/10/18    Time  6    Period  Weeks    Status  New      PT LONG TERM GOAL #4   Title  Independent in HEP 01/10/18    Time  6    Period  Weeks    Status  New             Plan - 11/29/17 1458    Clinical Impression Statement  Francisco Dixon presents with long standing numbness and tingling in both feet with no known injury or pathology. He has limited foot and toe mobility with tightness noted with toe flexion bilat. Francisco Dixon was instructed in HEP and will call to schedule additiional appointmentts as needed.     History and Personal Factors relevant to plan of care:  long standing nature of tingling and numbness bilat feet > 10 yrs with symptoms constant in nature in the past 1-2 years.     Clinical Presentation  Stable    Clinical Decision Making  Low    Rehab Potential  Good  PT Frequency  1x / week    PT Duration  6 weeks    PT Treatment/Interventions  Patient/family education;ADLs/Self Care Home Management;Cryotherapy;Electrical Stimulation;Iontophoresis '4mg'$ /ml Dexamethasone;Moist Heat;Ultrasound;Dry needling;Manual techniques;Neuromuscular re-education;Therapeutic activities;Therapeutic exercise    PT Next Visit Plan  review HEP; continue with joint mobilization and PROM/stretching bilat  feet; possible trial of estim for feet     Consulted and Agree with Plan of Care  Patient       Patient will benefit from skilled therapeutic intervention in order to improve the following deficits and impairments:  Postural dysfunction, Improper body mechanics, Impaired sensation, Hypomobility, Decreased range of motion, Decreased mobility  Visit Diagnosis: Stiffness of left foot, not elsewhere classified - Plan: PT plan of care cert/re-cert  Stiffness of right foot, not elsewhere classified - Plan: PT plan of care cert/re-cert     Problem List Patient Active Problem List   Diagnosis Date Noted  . Numbness and tingling of both feet 11/27/2017  . Major depression in full remission ([place]) 07/09/2017  . Encounter for Medicare annual wellness exam 07/07/2017  . Other abnormal glucose 07/07/2017  . Allergy 07/07/2017  . Plantar fasciitis, right 12/07/2016  . Labile hypertension 02/26/2016  . Mixed hyperlipidemia 02/26/2016  . Vitamin D deficiency 02/26/2016    Celyn Nilda Simmer PT, MPH  11/29/2017, 3:06 PM  [facility] [place], [place], [address] Phone: [phone]   Fax:  [phone]  Name: Francisco Dixon MRN: [id number] Date of Birth: [birth year]  PHYSICAL THERAPY DISCHARGE SUMMARY  Visits from Start of Care: Evaluation only  Current functional level related to goals / functional outcomes: Unknown    Remaining deficits: Unknown    Education / Equipment: HEP Plan: Patient agrees to discharge.  Patient goals were not met. Patient is being discharged due to not returning since the last visit.  ?????     Celyn P. Helene Kelp PT, MPH 12/25/17 9:50 AM

## 2017-12-25 ENCOUNTER — Encounter: Payer: Self-pay | Admitting: Sports Medicine

## 2017-12-25 ENCOUNTER — Ambulatory Visit (INDEPENDENT_AMBULATORY_CARE_PROVIDER_SITE_OTHER): Payer: Medicare Other | Admitting: Sports Medicine

## 2017-12-25 DIAGNOSIS — R202 Paresthesia of skin: Secondary | ICD-10-CM | POA: Diagnosis not present

## 2017-12-25 DIAGNOSIS — R200 Anesthesia of skin: Secondary | ICD-10-CM | POA: Diagnosis not present

## 2017-12-25 DIAGNOSIS — M722 Plantar fascial fibromatosis: Secondary | ICD-10-CM | POA: Diagnosis not present

## 2017-12-25 NOTE — Assessment & Plan Note (Signed)
Continues to do well with new set of custom orthotics that were made last month.

## 2017-12-25 NOTE — Assessment & Plan Note (Signed)
Mostly in the big toes consistent with L4 radiculitis. X-rays consistent with lumbar DDD. He did notice a good improvement with gabapentin and therapy. Because his symptoms are so mild that he is interested in discontinuing gabapentin as he feels he can live with it. We did give sufficient anticipatory guidance, specifically discussing reasons to contact me again, progressive weakness, foot drop. Otherwise return as needed and stay as active as he can.

## 2017-12-25 NOTE — Progress Notes (Signed)
Subjective:    CC: Recheck foot numbness  HPI: Francisco Dixon returns, he is a pleasant 67 year old male, he has plantar fasciitis, we have treated well with custom orthotics with good resolution of symptoms.  More recently he started to have some numbness in both big toes, suspected initially to be lumbar in origin, x-rays did show multilevel DDD, he is done some physical therapy, gabapentin and has near complete resolution of symptoms.  On further questioning his symptoms are mild enough where he feels as though he can live with it, and would rather just not take the gabapentin.  No progressive weakness, bowel or bladder dysfunction, saddle numbness or constitutional symptoms.  I reviewed the past medical history, family history, social history, surgical history, and allergies today and no changes were needed.  Please see the problem list section below in epic for further details.  Past Medical History: Past Medical History:  Diagnosis Date  . GERD (gastroesophageal reflux disease)   . Hypercholesterolemia   . Hypertension    labile   . Vitamin D deficiency    Past Surgical History: Past Surgical History:  Procedure Laterality Date  . COLONOSCOPY N/A 2002,2007,2012  . ROTATOR CUFF REPAIR Right 2015   Social History: Social History   Socioeconomic History  . Marital status: Married    Spouse name: Not on file  . Number of children: Not on file  . Years of education: Not on file  . Highest education level: Not on file  Occupational History  . Not on file  Social Needs  . Financial resource strain: Not on file  . Food insecurity:    Worry: Not on file    Inability: Not on file  . Transportation needs:    Medical: Not on file    Non-medical: Not on file  Tobacco Use  . Smoking status: Never Smoker  . Smokeless tobacco: Never Used  Substance and Sexual Activity  . Alcohol use: Yes    Comment: drinks 2-3 beers a week  . Drug use: No  . Sexual activity: Not on file  Lifestyle    . Physical activity:    Days per week: Not on file    Minutes per session: Not on file  . Stress: Not on file  Relationships  . Social connections:    Talks on phone: Not on file    Gets together: Not on file    Attends religious service: Not on file    Active member of club or organization: Not on file    Attends meetings of clubs or organizations: Not on file    Relationship status: Not on file  Other Topics Concern  . Not on file  Social History Narrative  . Not on file   Family History: No family history on file. Allergies: Allergies  Allergen Reactions  . Other     Bee stings   Medications: See med rec.  Review of Systems: No fevers, chills, night sweats, weight loss, chest pain, or shortness of breath.   Objective:    General: Well Developed, well nourished, and in no acute distress.  Neuro: Alert and oriented x3, extra-ocular muscles intact, sensation grossly intact.  HEENT: Normocephalic, atraumatic, pupils equal round reactive to light, neck supple, no masses, no lymphadenopathy, thyroid nonpalpable.  Skin: Warm and dry, no rashes. Cardiac: Regular rate and rhythm, no murmurs rubs or gallops, no lower extremity edema.  Respiratory: Clear to auscultation bilaterally. Not using accessory muscles, speaking in full sentences.  Impression and Recommendations:  Numbness and tingling of both feet Mostly in the big toes consistent with L4 radiculitis. X-rays consistent with lumbar DDD. He did notice a good improvement with gabapentin and therapy. Because his symptoms are so mild that he is interested in discontinuing gabapentin as he feels he can live with it. We did give sufficient anticipatory guidance, specifically discussing reasons to contact me again, progressive weakness, foot drop. Otherwise return as needed and stay as active as he can.  Plantar fasciitis, right Continues to do well with new set of custom orthotics that were made last month.  I spent  25 minutes with this patient, greater than 50% was face-to-face time counseling regarding the above diagnoses including anticipatory guidance, specifically discussing reasons to contact me again, progressive weakness, foot drop. ___________________________________________ Gwen Her. Dianah Field, M.D., ABFM., CAQSM. Primary Care and [place] [employer] of [employer] [facility] [employer]

## 2018-01-11 ENCOUNTER — Ambulatory Visit: Payer: Self-pay | Admitting: Internal Medicine

## 2018-01-15 LAB — CBC WITH DIFFERENTIAL/PLATELET
BASOS ABS: 42 {cells}/uL (ref 0–200)
Basophils Relative: 0.8 %
EOS ABS: 180 {cells}/uL (ref 15–500)
Eosinophils Relative: 3.4 %
HEMATOCRIT: 41.1 % (ref 38.5–50.0)
HEMOGLOBIN: 14 g/dL (ref 13.2–17.1)
LYMPHS ABS: 1813 {cells}/uL (ref 850–3900)
MCH: 29.9 pg (ref 27.0–33.0)
MCHC: 34.1 g/dL (ref 32.0–36.0)
MCV: 87.6 fL (ref 80.0–100.0)
MPV: 10.2 fL (ref 7.5–12.5)
Monocytes Relative: 9.2 %
NEUTROS ABS: 2777 {cells}/uL (ref 1500–7800)
Neutrophils Relative %: 52.4 %
Platelets: 249 10*3/uL (ref 140–400)
RBC: 4.69 10*6/uL (ref 4.20–5.80)
RDW: 13 % (ref 11.0–15.0)
Total Lymphocyte: 34.2 %
WBC: 5.3 10*3/uL (ref 3.8–10.8)
WBCMIX: 488 {cells}/uL (ref 200–950)

## 2018-01-15 LAB — EHRLICHIA ANTIBODY PANEL: E. CHAFFEENSIS AB IGG: <1:64 {titer}

## 2018-01-15 LAB — COMPLETE METABOLIC PANEL WITH GFR
AG Ratio: 1.6 (calc) (ref 1.0–2.5)
ALBUMIN MSPROF: 4.5 g/dL (ref 3.6–5.1)
ALKALINE PHOSPHATASE (APISO): 54 U/L (ref 40–115)
ALT: 24 U/L (ref 9–46)
AST: 24 U/L (ref 10–35)
BILIRUBIN TOTAL: 0.7 mg/dL (ref 0.2–1.2)
BUN: 19 mg/dL (ref 7–25)
CHLORIDE: 104 mmol/L (ref 98–110)
CO2: 29 mmol/L (ref 20–32)
CREATININE: 1.04 mg/dL (ref 0.70–1.25)
Calcium: 9.9 mg/dL (ref 8.6–10.3)
GFR, EST AFRICAN AMERICAN: 86 mL/min/{1.73_m2} (ref >=60)
GFR, Est Non African American: 74 mL/min/{1.73_m2} (ref >=60)
GLOBULIN: 2.8 g/dL (ref 1.9–3.7)
Glucose, Bld: 91 mg/dL (ref 65–99)
Potassium: 4.4 mmol/L (ref 3.5–5.3)
SODIUM: 141 mmol/L (ref 135–146)
Total Protein: 7.3 g/dL (ref 6.1–8.1)

## 2018-01-15 LAB — MAGNESIUM: MAGNESIUM: 2.2 mg/dL (ref 1.5–2.5)

## 2018-01-15 LAB — HEMOGLOBIN A1C
HEMOGLOBIN A1C: 5.6 %{Hb} (ref <5.7)
Mean Plasma Glucose: 114 (calc)
eAG (mmol/L): 6.3 (calc)

## 2018-01-15 LAB — B. BURGDORFI ANTIBODIES: B burgdorferi Ab IgG+IgM: <0.9 index

## 2018-01-15 LAB — LIPID PANEL
Cholesterol: 194 mg/dL (ref <200)
HDL: 45 mg/dL (ref >40)
LDL CHOLESTEROL (CALC): 121 mg/dL — AB
NON-HDL CHOLESTEROL (CALC): 149 mg/dL — AB (ref <130)
Total CHOL/HDL Ratio: 4.3 (calc) (ref <5.0)
Triglycerides: 166 mg/dL — ABNORMAL HIGH (ref <150)

## 2018-01-15 LAB — ROCKY MTN SPOTTED FVR ABS PNL(IGG+IGM)
RMSF IgG: NOT DETECTED
RMSF IgM: NOT DETECTED

## 2018-01-15 LAB — VITAMIN D 25 HYDROXY (VIT D DEFICIENCY, FRACTURES): Vit D, 25-Hydroxy: 87 ng/mL (ref 30–100)

## 2018-01-15 LAB — TSH: TSH: 2.25 m[IU]/L (ref 0.40–4.50)

## 2018-01-15 LAB — ALDOLASE: Aldolase: 4.2 U/L (ref <=8.1)

## 2018-01-15 LAB — INSULIN, RANDOM: Insulin: 3.3 u[IU]/mL (ref 2.0–19.6)

## 2018-01-15 LAB — CK: Total CK: 107 U/L (ref 44–196)

## 2018-01-16 ENCOUNTER — Ambulatory Visit: Payer: Self-pay | Admitting: Internal Medicine

## 2018-01-22 ENCOUNTER — Other Ambulatory Visit: Payer: Self-pay | Admitting: Physician Assistant

## 2018-01-23 ENCOUNTER — Ambulatory Visit (INDEPENDENT_AMBULATORY_CARE_PROVIDER_SITE_OTHER): Payer: Medicare Other | Admitting: Internal Medicine

## 2018-01-23 ENCOUNTER — Encounter: Payer: Self-pay | Admitting: Internal Medicine

## 2018-01-23 VITALS — BP 110/62 | HR 64 | Temp 97.5°F | Resp 16 | Ht 66.5 in | Wt 159.4 lb

## 2018-01-23 DIAGNOSIS — E559 Vitamin D deficiency, unspecified: Secondary | ICD-10-CM

## 2018-01-23 DIAGNOSIS — R7309 Other abnormal glucose: Secondary | ICD-10-CM

## 2018-01-23 DIAGNOSIS — F3341 Major depressive disorder, recurrent, in partial remission: Secondary | ICD-10-CM | POA: Diagnosis not present

## 2018-01-23 DIAGNOSIS — Z79899 Other long term (current) drug therapy: Secondary | ICD-10-CM | POA: Diagnosis not present

## 2018-01-23 DIAGNOSIS — E782 Mixed hyperlipidemia: Secondary | ICD-10-CM | POA: Diagnosis not present

## 2018-01-23 DIAGNOSIS — R0989 Other specified symptoms and signs involving the circulatory and respiratory systems: Secondary | ICD-10-CM | POA: Diagnosis not present

## 2018-01-23 MED ORDER — BUPROPION HCL ER (XL) 150 MG PO TB24: ORAL_TABLET | ORAL | 1 refills | Status: DC

## 2018-01-23 NOTE — Progress Notes (Signed)
This very nice 67 y.o. [name] presents for 6 month follow up with HTN, HLD, Pre-Diabetes and Vitamin D Deficiency.      Patient is followed expectantly for labile HTN & BP has been controlled at home. Today's BP is at goal - 110/62. Patient has had no complaints of any cardiac type chest pain, palpitations, dyspnea / orthopnea / PND, dizziness, claudication, or dependent edema.     Hyperlipidemia (2012)  is not controlled with diet & meds. Patient denies myalgias or other med SE's. Last Lipids were not at goal: Lab Results  Component Value Date   CHOL 194 10/03/2017   HDL 45 10/03/2017   LDLCALC 121 (H) 10/03/2017   TRIG 166 (H) 10/03/2017   CHOLHDL 4.3 10/03/2017      Also, the patient  is followed expectantly for PreDiabetes and has had no symptoms of reactive hypoglycemia, diabetic polys, paresthesias or visual blurring.  Last A1c was Normal & at goal: Lab Results  Component Value Date   HGBA1C 5.6 10/03/2017      Further, the patient also has history of Vitamin D Deficiency ("29" in 2017) and supplements vitamin D without any suspected side-effects. Last vitamin D was  at goal:  Lab Results  Component Value Date   VD25OH 87 10/03/2017   Current Outpatient Medications on File Prior to Visit  Medication Sig  . aspirin EC 81 MG tablet Take 81 mg by mouth daily.  . Cholecalciferol (VITAMIN D3) 5000 units CAPS Takes 2 caps (10,000 Units) daily  . EPINEPHrine (EPIPEN) 0.3 mg/0.3 mL SOAJ injection Inject 0.3 mg into the muscle once.  . Flaxseed, Linseed, (FLAXSEED OIL) 1000 MG CAPS Take 2 capsules by mouth daily.  . Omega-3 Fatty Acids (FISH OIL) 1000 MG CAPS Takes 1 capsule daily  . rosuvastatin (CRESTOR) 40 MG tablet TAKE 1/2 TO 1 TABLET BY MOUTH DAILY OR AS DIRECTED FOR CHOLESTEROL  . sertraline (ZOLOFT) 100 MG tablet Take 1 tablet daily for Mood  . terbinafine (LAMISIL) 250 MG tablet Take 1 tablet (250 mg total) by mouth daily.   No current facility-administered medications on  file prior to visit.    Allergies  Allergen Reactions  . Other     Bee stings   PMHx:   Past Medical History:  Diagnosis Date  . GERD (gastroesophageal reflux disease)   . Hypercholesterolemia   . Hypertension    labile   . Vitamin D deficiency    Immunization History  Administered Date(s) Administered  . Td 01/03/2017   Past Surgical History:  Procedure Laterality Date  . COLONOSCOPY N/A 2002,2007,2012  . ROTATOR CUFF REPAIR Right 2015   FHx:    Reviewed / unchanged  SHx:    Reviewed / unchanged   Systems Review:  Constitutional: Denies fever, chills, wt changes, headaches, insomnia, fatigue, night sweats, change in appetite. Eyes: Denies redness, blurred vision, diplopia, discharge, itchy, watery eyes.  ENT: Denies discharge, congestion, post nasal drip, epistaxis, sore throat, earache, hearing loss, dental pain, tinnitus, vertigo, sinus pain, snoring.  CV: Denies chest pain, palpitations, irregular heartbeat, syncope, dyspnea, diaphoresis, orthopnea, PND, claudication or edema. Respiratory: denies cough, dyspnea, DOE, pleurisy, hoarseness, laryngitis, wheezing.  Gastrointestinal: Denies dysphagia, odynophagia, heartburn, reflux, water brash, abdominal pain or cramps, nausea, vomiting, bloating, diarrhea, constipation, hematemesis, melena, hematochezia  or hemorrhoids. Genitourinary: Denies dysuria, frequency, urgency, nocturia, hesitancy, discharge, hematuria or flank pain. Musculoskeletal: Denies arthralgias, myalgias, stiffness, jt. swelling, pain, limping or strain/sprain.  Skin: Denies pruritus, rash, hives,  warts, acne, eczema or change in skin lesion(s). Neuro: No weakness, tremor, incoordination, spasms, paresthesia or pain. Psychiatric: Denies confusion, memory loss or sensory loss. Endo: Denies change in weight, skin or hair change.  Heme/Lymph: No excessive bleeding, bruising or enlarged lymph nodes.  Physical Exam  BP 110/62   Pulse 64   Temp (!) 97.5 F  (36.4 C)   Resp 16   Ht 5' 6.5" (1.689 m)   Wt 159 lb 6.4 oz (72.3 kg)   BMI 25.34 kg/m   Appears  well nourished, well groomed  and in no distress.  Eyes: PERRLA, EOMs, conjunctiva no swelling or erythema. Sinuses: No frontal/maxillary tenderness ENT/Mouth: EAC's clear, TM's nl w/o erythema, bulging. Nares clear w/o erythema, swelling, exudates. Oropharynx clear without erythema or exudates. Oral hygiene is good. Tongue normal, non obstructing. Hearing intact.  Neck: Supple. Thyroid not palpable. Car 2+/2+ without bruits, nodes or JVD. Chest: Respirations nl with BS clear & equal w/o rales, rhonchi, wheezing or stridor.  Cor: Heart sounds normal w/ regular rate and rhythm without sig. murmurs, gallops, clicks or rubs. Peripheral pulses normal and equal  without edema.  Abdomen: Soft & bowel sounds normal. Non-tender w/o guarding, rebound, hernias, masses or organomegaly.  Lymphatics: Unremarkable.  Musculoskeletal: Full ROM all peripheral extremities, joint stability, 5/5 strength and normal gait.  Skin: Warm, dry without exposed rashes, lesions or ecchymosis apparent.  Neuro: Cranial nerves intact, reflexes equal bilaterally. Sensory-motor testing grossly intact. Tendon reflexes grossly intact.  Pysch: Alert & oriented x 3.  Insight and judgement nl & appropriate. No ideations.  Assessment and Plan:  1. Labile hypertension  - Continue medication, monitor blood pressure at home.  - Continue DASH diet.  Reminder to go to the ER if any CP,  SOB, nausea, dizziness, severe HA, changes vision/speech.  - CBC with Differential/Platelet - COMPLETE METABOLIC PANEL WITH GFR - Magnesium - TSH  2. Hyperlipidemia, mixed  - Continue diet/meds, exercise,& lifestyle modifications.  - Continue monitor periodic cholesterol/liver & renal functions   - Lipid panel - TSH  3. Abnormal glucose  - Continue diet, exercise,  - lifestyle modifications.  - Monitor appropriate labs.  -  Hemoglobin A1c - Insulin, random  4. Vitamin D deficiency  - Continue supplementation.  - VITAMIN D 25 Hydroxyl 5. Medication management  - CBC with Differential/Platelet - COMPLETE METABOLIC PANEL WITH GFR - Magnesium - Lipid panel - TSH - Hemoglobin A1c - Insulin, random - VITAMIN D 25 Hydroxyl  6. Recurrent major depressive disorder (HCC)  - buPROPion (WELLBUTRIN XL) 150 MG 24 hr tablet; Take 1 tablet every morning for Mood  Dispense: 90 tablet; Refill: 1       Discussed  regular exercise, BP monitoring, weight control to achieve/maintain BMI less than 25 and discussed med and SE's. Recommended labs to assess and monitor clinical status with further disposition pending results of labs. Over 30 minutes of exam, counseling, chart review was performed.

## 2018-01-23 NOTE — Patient Instructions (Signed)

## 2018-01-24 LAB — COMPLETE METABOLIC PANEL WITH GFR
AG Ratio: 1.8 (calc) (ref 1.0–2.5)
ALKALINE PHOSPHATASE (APISO): 50 U/L (ref 40–115)
ALT: 16 U/L (ref 9–46)
AST: 15 U/L (ref 10–35)
Albumin: 4.2 g/dL (ref 3.6–5.1)
BUN: 20 mg/dL (ref 7–25)
CO2: 30 mmol/L (ref 20–32)
CREATININE: 1.03 mg/dL (ref 0.70–1.25)
Calcium: 9.5 mg/dL (ref 8.6–10.3)
Chloride: 106 mmol/L (ref 98–110)
GFR, Est African American: 87 mL/min/{1.73_m2} (ref >=60)
GFR, Est Non African American: 75 mL/min/{1.73_m2} (ref >=60)
GLUCOSE: 101 mg/dL — AB (ref 65–99)
Globulin: 2.3 g/dL (calc) (ref 1.9–3.7)
Potassium: 4.2 mmol/L (ref 3.5–5.3)
Sodium: 143 mmol/L (ref 135–146)
Total Bilirubin: 0.4 mg/dL (ref 0.2–1.2)
Total Protein: 6.5 g/dL (ref 6.1–8.1)

## 2018-01-24 LAB — MAGNESIUM: MAGNESIUM: 2.1 mg/dL (ref 1.5–2.5)

## 2018-01-24 LAB — LIPID PANEL
CHOLESTEROL: 175 mg/dL (ref <200)
HDL: 44 mg/dL (ref >40)
LDL CHOLESTEROL (CALC): 92 mg/dL
Non-HDL Cholesterol (Calc): 131 mg/dL (calc) — ABNORMAL HIGH (ref <130)
TRIGLYCERIDES: 294 mg/dL — AB (ref <150)
Total CHOL/HDL Ratio: 4 (calc) (ref <5.0)

## 2018-01-24 LAB — CBC WITH DIFFERENTIAL/PLATELET
BASOS ABS: 37 {cells}/uL (ref 0–200)
Basophils Relative: 0.6 %
EOS PCT: 1.9 %
Eosinophils Absolute: 118 cells/uL (ref 15–500)
HCT: 40.8 % (ref 38.5–50.0)
Hemoglobin: 13.7 g/dL (ref 13.2–17.1)
Lymphs Abs: 1476 cells/uL (ref 850–3900)
MCH: 30 pg (ref 27.0–33.0)
MCHC: 33.6 g/dL (ref 32.0–36.0)
MCV: 89.5 fL (ref 80.0–100.0)
MONOS PCT: 7.4 %
MPV: 10.5 fL (ref 7.5–12.5)
NEUTROS PCT: 66.3 %
Neutro Abs: 4111 cells/uL (ref 1500–7800)
PLATELETS: 246 10*3/uL (ref 140–400)
RBC: 4.56 10*6/uL (ref 4.20–5.80)
RDW: 12.5 % (ref 11.0–15.0)
TOTAL LYMPHOCYTE: 23.8 %
WBC mixed population: 459 cells/uL (ref 200–950)
WBC: 6.2 10*3/uL (ref 3.8–10.8)

## 2018-01-24 LAB — HEMOGLOBIN A1C
Hgb A1c MFr Bld: 5.6 % of total Hgb (ref <5.7)
Mean Plasma Glucose: 114 (calc)
eAG (mmol/L): 6.3 (calc)

## 2018-01-24 LAB — VITAMIN D 25 HYDROXY (VIT D DEFICIENCY, FRACTURES): VIT D 25 HYDROXY: 77 ng/mL (ref 30–100)

## 2018-01-24 LAB — INSULIN, RANDOM: INSULIN: 6.1 u[IU]/mL (ref 2.0–19.6)

## 2018-01-24 LAB — TSH: TSH: 1.84 mIU/L (ref 0.40–4.50)

## 2018-01-27 ENCOUNTER — Encounter: Payer: Self-pay | Admitting: Internal Medicine

## 2018-02-21 ENCOUNTER — Other Ambulatory Visit: Payer: Self-pay | Admitting: Internal Medicine

## 2018-02-21 DIAGNOSIS — B351 Tinea unguium: Secondary | ICD-10-CM

## 2018-03-06 ENCOUNTER — Other Ambulatory Visit: Payer: Self-pay | Admitting: Internal Medicine

## 2018-03-06 DIAGNOSIS — M2550 Pain in unspecified joint: Secondary | ICD-10-CM

## 2018-03-06 MED ORDER — MELOXICAM 15 MG PO TABS: ORAL_TABLET | ORAL | 3 refills | Status: DC

## 2018-04-29 ENCOUNTER — Encounter: Payer: Self-pay | Admitting: Internal Medicine

## 2018-04-29 ENCOUNTER — Ambulatory Visit (INDEPENDENT_AMBULATORY_CARE_PROVIDER_SITE_OTHER): Payer: Medicare Other | Admitting: Internal Medicine

## 2018-04-29 VITALS — BP 122/84 | HR 60 | Temp 97.8°F | Resp 16 | Ht 67.0 in | Wt 164.8 lb

## 2018-04-29 DIAGNOSIS — N401 Enlarged prostate with lower urinary tract symptoms: Secondary | ICD-10-CM

## 2018-04-29 DIAGNOSIS — Z1211 Encounter for screening for malignant neoplasm of colon: Secondary | ICD-10-CM

## 2018-04-29 DIAGNOSIS — Z125 Encounter for screening for malignant neoplasm of prostate: Secondary | ICD-10-CM

## 2018-04-29 DIAGNOSIS — R7309 Other abnormal glucose: Secondary | ICD-10-CM

## 2018-04-29 DIAGNOSIS — Z136 Encounter for screening for cardiovascular disorders: Secondary | ICD-10-CM

## 2018-04-29 DIAGNOSIS — Z79899 Other long term (current) drug therapy: Secondary | ICD-10-CM

## 2018-04-29 DIAGNOSIS — N138 Other obstructive and reflux uropathy: Secondary | ICD-10-CM | POA: Diagnosis not present

## 2018-04-29 DIAGNOSIS — E559 Vitamin D deficiency, unspecified: Secondary | ICD-10-CM | POA: Diagnosis not present

## 2018-04-29 DIAGNOSIS — Z1212 Encounter for screening for malignant neoplasm of rectum: Secondary | ICD-10-CM

## 2018-04-29 DIAGNOSIS — E782 Mixed hyperlipidemia: Secondary | ICD-10-CM

## 2018-04-29 DIAGNOSIS — Z8249 Family history of ischemic heart disease and other diseases of the circulatory system: Secondary | ICD-10-CM

## 2018-04-29 DIAGNOSIS — R0989 Other specified symptoms and signs involving the circulatory and respiratory systems: Secondary | ICD-10-CM | POA: Diagnosis not present

## 2018-04-29 DIAGNOSIS — Z23 Encounter for immunization: Secondary | ICD-10-CM | POA: Diagnosis not present

## 2018-04-29 NOTE — Progress Notes (Signed)
[facility]   Unk Pinto, M.D.     Uvaldo Bristle. Silverio Lay, P.A.-C Liane Comber, [place]                [address] [place], [place] [address] Telephone [phone] Telefax [phone]  Comprehensive Evaluation & Examination     This very nice 68 y.o. [name] presents for a comprehensive evaluation and management of multiple medical co-morbidities.  Patient has been followed for labile HTN, HLD, Prediabetes and Vitamin D Deficiency.     Patient has  Labile HTN being followed expectantly. Patient's BP has been controlled at home.  Today's BP is at goal - 122/84. Patient denies any cardiac symptoms as chest pain, palpitations, shortness of breath, dizziness or ankle swelling.     Patient's hyperlipidemia is controlled with diet and medications. Patient denies myalgias or other medication SE's. Last lipids were at goal albeit elevated Trig's: Lab Results  Component Value Date   CHOL 175 01/23/2018   HDL 44 01/23/2018   LDLCALC 92 01/23/2018   TRIG 294 (H) 01/23/2018   CHOLHDL 4.0 01/23/2018      Patient is followed expectantly for glucose intolerance and patient denies reactive hypoglycemic symptoms, visual blurring, diabetic polys or paresthesias. Last A1c was Normal & at goal: Lab Results  Component Value Date   HGBA1C 5.6 01/23/2018       Finally, patient has history of Vitamin D Deficiency ("29" / 2017)  and last vitamin D was at goal: Lab Results  Component Value Date   VD25OH 77 01/23/2018   Current Outpatient Medications on File Prior to Visit  Medication Sig  . aspirin EC 81 MG tablet Take 81 mg by mouth daily.  Marland Kitchen buPROPion (WELLBUTRIN XL) 150 MG 24 hr tablet Take 1 tablet every morning for Mood  . Cholecalciferol (VITAMIN D3) 5000 units CAPS Takes 2 caps (10,000 Units) daily  . EPINEPHrine (EPIPEN) 0.3 mg/0.3 mL SOAJ injection Inject 0.3 mg into the muscle once.  . Flaxseed,  Linseed, (FLAXSEED OIL) 1000 MG CAPS Take 2 capsules by mouth daily.  . meloxicam (MOBIC) 15 MG tablet Take 1/2 to 1 tablet daily with food for Pain & Inflammation - try limit to 5 days /week to avoid kidney damage  . Omega-3 Fatty Acids (FISH OIL) 1000 MG CAPS Takes 1 capsule daily  . rosuvastatin (CRESTOR) 40 MG tablet TAKE 1/2 TO 1 TABLET BY MOUTH DAILY OR AS DIRECTED FOR CHOLESTEROL  . terbinafine (LAMISIL) 250 MG tablet TAKE 1 TABLET(250 MG) BY MOUTH DAILY  . sertraline (ZOLOFT) 100 MG tablet Take 1 tablet daily for Mood   No current facility-administered medications on file prior to visit.    Allergies  Allergen Reactions  . Other     Bee stings   Past Medical History:  Diagnosis Date  . GERD (gastroesophageal reflux disease)   . Hypercholesterolemia   . Hypertension    labile   . Vitamin D deficiency    Health Maintenance  Topic Date Due  . PNA vac Low Risk Adult (1 of 2 - PCV13) 06/03/2015  . Hepatitis C Screening  07/10/2018 (Originally 10/28/1950)  . COLONOSCOPY  11/15/2025  . TETANUS/TDAP  01/04/2027  . INFLUENZA VACCINE  Discontinued   Immunization History  Administered Date(s) Administered  . Td 01/03/2017   Last Colon -  Past Surgical History:  Procedure Laterality Date  .  COLONOSCOPY N/A 2002,2007,2012  . ROTATOR CUFF REPAIR Right 2015   Social History   Socioeconomic History  . Marital status: Married    Spouse name: Neoma Laming  . Number of children:   Occupational History  . Retired [employer]  Tobacco Use  . Smoking status: Never Smoker  . Smokeless tobacco: Never Used  Substance and Sexual Activity  . Alcohol use: Yes    Comment: drinks 2-3 beers a week  . Drug use: No  . Sexual activity: Not on file    ROS Constitutional: Denies fever, chills, weight loss/gain, headaches, insomnia,  night sweats or change in appetite. Does c/o fatigue. Eyes: Denies redness, blurred vision, diplopia, discharge, itchy or watery eyes.  ENT: Denies discharge,  congestion, post nasal drip, epistaxis, sore throat, earache, hearing loss, dental pain, Tinnitus, Vertigo, Sinus pain or snoring.  Cardio: Denies chest pain, palpitations, irregular heartbeat, syncope, dyspnea, diaphoresis, orthopnea, PND, claudication or edema Respiratory: denies cough, dyspnea, DOE, pleurisy, hoarseness, laryngitis or wheezing.  Gastrointestinal: Denies dysphagia, heartburn, reflux, water brash, pain, cramps, nausea, vomiting, bloating, diarrhea, constipation, hematemesis, melena, hematochezia, jaundice or hemorrhoids Genitourinary: Denies dysuria, frequency, discharge, hematuria or flank pain. Has urgency, nocturia x 2-3 & occasional hesitancy. Musculoskeletal: Denies arthralgia, myalgia, stiffness, Jt. Swelling, pain, limp or strain/sprain. Denies Falls. Skin: Denies puritis, rash, hives, warts, acne, eczema or change in skin lesion Neuro: No weakness, tremor, incoordination, spasms, paresthesia or pain Psychiatric: Denies confusion, memory loss or sensory loss. Denies Depression. Endocrine: Denies change in weight, skin, hair change, nocturia, and paresthesia, diabetic polys, visual blurring or hyper / hypo glycemic episodes.  Heme/Lymph: No excessive bleeding, bruising or enlarged lymph nodes.  Physical Exam  BP 122/84   Pulse 60   Temp 97.8 F (36.6 C)   Resp 16   Ht 5\' 7"  (1.702 m)   Wt 164 lb 12.8 oz (74.8 kg)   BMI 25.81 kg/m   General Appearance: Well nourished and well groomed and in no apparent distress.  Eyes: PERRLA, EOMs, conjunctiva no swelling or erythema, normal fundi and vessels. Sinuses: No frontal/maxillary tenderness ENT/Mouth: EACs patent / TMs  nl. Nares clear without erythema, swelling, mucoid exudates. Oral hygiene is good. No erythema, swelling, or exudate. Tongue normal, non-obstructing. Tonsils not swollen or erythematous. Hearing normal.  Neck: Supple, thyroid not palpable. No bruits, nodes or JVD. Respiratory: Respiratory effort normal.   BS equal and clear bilateral without rales, rhonci, wheezing or stridor. Cardio: Heart sounds are normal with regular rate and rhythm and no murmurs, rubs or gallops. Peripheral pulses are normal and equal bilaterally without edema. No aortic or femoral bruits. Chest: symmetric with normal excursions and percussion.  Abdomen: Soft, with Nl bowel sounds. Nontender, no guarding, rebound, hernias, masses, or organomegaly.  Lymphatics: Non tender without lymphadenopathy.  Musculoskeletal: Full ROM all peripheral extremities, joint stability, 5/5 strength, and normal gait. Skin: Warm and dry without rashes, lesions, cyanosis, clubbing or  ecchymosis.  Neuro: Cranial nerves intact, reflexes equal bilaterally. Normal muscle tone, no cerebellar symptoms. Sensation intact.  Pysch: Alert and oriented X 3 with normal affect, insight and judgment appropriate.   Assessment and Plan  1. Labile hypertension  - EKG 12-Lead - [place], RETROPERITNL ABD,  LTD - Urinalysis, Routine w reflex microscopic - Microalbumin / creatinine urine ratio - CBC with Differential/Platelet - COMPLETE METABOLIC PANEL WITH GFR - Magnesium - TSH  2. Hyperlipidemia, mixed  - EKG 12-Lead - [place], RETROPERITNL ABD,  LTD - Lipid panel - TSH  3. Abnormal  glucose  - EKG 12-Lead - [place], RETROPERITNL ABD,  LTD - Hemoglobin A1c - Insulin, random  4. Vitamin D deficiency  - VITAMIN D 25 Hydroxyl  5. Screening for colorectal cancer  - POC Hemoccult Bld/Stl l  6. BPH with obstruction/lower urinary tract symptoms  - PSA  7. Prostate cancer screening  - PSA  8. Screening for ischemic heart disease  - EKG 12-Lead  9. Screening for AAA (aortic abdominal aneurysm)  - [place], RETROPERITNL ABD,  LTD  10. Medication management  - Urinalysis, Routine w reflex microscopic - Microalbumin / creatinine urine ratio - CBC with Differential/Platelet - COMPLETE METABOLIC PANEL WITH GFR - Magnesium - Lipid panel - TSH -  Hemoglobin A1c - Insulin, random - VITAMIN D 25 Hydroxyl  11. Need for prophylactic vaccination against Streptococcus pneumoniae (pneumococcus)  - Pneumococcal conjugate vaccine 13-valent          Patient was counseled in prudent diet, weight control to achieve/maintain BMI less than 25, BP monitoring, regular exercise and medications as discussed.  Discussed med effects and SE's. Routine screening labs and tests as requested with regular follow-up as recommended. Over 40 minutes of exam, counseling, chart review and high complex critical decision making was performed

## 2018-04-29 NOTE — Patient Instructions (Addendum)

## 2018-04-30 LAB — CBC WITH DIFFERENTIAL/PLATELET
Absolute Monocytes: 500 cells/uL (ref 200–950)
Basophils Absolute: 49 cells/uL (ref 0–200)
Basophils Relative: 1 %
Eosinophils Absolute: 162 cells/uL (ref 15–500)
Eosinophils Relative: 3.3 %
HCT: 42.8 % (ref 38.5–50.0)
HEMOGLOBIN: 14.3 g/dL (ref 13.2–17.1)
Lymphs Abs: 1362 cells/uL (ref 850–3900)
MCH: 29.7 pg (ref 27.0–33.0)
MCHC: 33.4 g/dL (ref 32.0–36.0)
MCV: 88.8 fL (ref 80.0–100.0)
MPV: 9.7 fL (ref 7.5–12.5)
Monocytes Relative: 10.2 %
Neutro Abs: 2827 cells/uL (ref 1500–7800)
Neutrophils Relative %: 57.7 %
Platelets: 289 10*3/uL (ref 140–400)
RBC: 4.82 10*6/uL (ref 4.20–5.80)
RDW: 12.7 % (ref 11.0–15.0)
Total Lymphocyte: 27.8 %
WBC: 4.9 10*3/uL (ref 3.8–10.8)

## 2018-04-30 LAB — MAGNESIUM: Magnesium: 2.1 mg/dL (ref 1.5–2.5)

## 2018-04-30 LAB — COMPLETE METABOLIC PANEL WITH GFR
AG Ratio: 1.5 (calc) (ref 1.0–2.5)
ALT: 21 U/L (ref 9–46)
AST: 20 U/L (ref 10–35)
Albumin: 4.4 g/dL (ref 3.6–5.1)
Alkaline phosphatase (APISO): 47 U/L (ref 40–115)
BUN: 20 mg/dL (ref 7–25)
CALCIUM: 10 mg/dL (ref 8.6–10.3)
CO2: 30 mmol/L (ref 20–32)
Chloride: 105 mmol/L (ref 98–110)
Creat: 1.08 mg/dL (ref 0.70–1.25)
GFR, EST NON AFRICAN AMERICAN: 71 mL/min/{1.73_m2} (ref >=60)
GFR, Est African American: 82 mL/min/{1.73_m2} (ref >=60)
Globulin: 2.9 g/dL (calc) (ref 1.9–3.7)
Glucose, Bld: 82 mg/dL (ref 65–99)
Potassium: 4.8 mmol/L (ref 3.5–5.3)
Sodium: 141 mmol/L (ref 135–146)
Total Bilirubin: 0.5 mg/dL (ref 0.2–1.2)
Total Protein: 7.3 g/dL (ref 6.1–8.1)

## 2018-04-30 LAB — LIPID PANEL
Cholesterol: 176 mg/dL (ref <200)
HDL: 51 mg/dL (ref >40)
LDL Cholesterol (Calc): 105 mg/dL (calc) — ABNORMAL HIGH
Non-HDL Cholesterol (Calc): 125 mg/dL (calc) (ref <130)
Total CHOL/HDL Ratio: 3.5 (calc) (ref <5.0)
Triglycerides: 104 mg/dL (ref <150)

## 2018-04-30 LAB — HEMOGLOBIN A1C
Hgb A1c MFr Bld: 5.9 % of total Hgb — ABNORMAL HIGH (ref <5.7)
Mean Plasma Glucose: 123 (calc)
eAG (mmol/L): 6.8 (calc)

## 2018-04-30 LAB — MICROALBUMIN / CREATININE URINE RATIO
Creatinine, Urine: 48 mg/dL (ref 20–320)
Microalb, Ur: <0.2 mg/dL

## 2018-04-30 LAB — VITAMIN D 25 HYDROXY (VIT D DEFICIENCY, FRACTURES): Vit D, 25-Hydroxy: 89 ng/mL (ref 30–100)

## 2018-04-30 LAB — URINALYSIS, ROUTINE W REFLEX MICROSCOPIC
Bilirubin Urine: NEGATIVE
Glucose, UA: NEGATIVE
Hgb urine dipstick: NEGATIVE
Ketones, ur: NEGATIVE
Leukocytes, UA: NEGATIVE
Nitrite: NEGATIVE
Protein, ur: NEGATIVE
Specific Gravity, Urine: 1.012 (ref 1.001–1.03)
pH: 7 (ref 5.0–8.0)

## 2018-04-30 LAB — PSA: PSA: 0.4 ng/mL (ref <=4.0)

## 2018-04-30 LAB — TSH: TSH: 2.68 mIU/L (ref 0.40–4.50)

## 2018-04-30 LAB — INSULIN, RANDOM: Insulin: 2.4 u[IU]/mL (ref 2.0–19.6)

## 2018-06-04 ENCOUNTER — Other Ambulatory Visit: Payer: Self-pay | Admitting: Physician Assistant

## 2018-06-04 ENCOUNTER — Other Ambulatory Visit: Payer: Self-pay | Admitting: Internal Medicine

## 2018-06-04 ENCOUNTER — Other Ambulatory Visit: Payer: Self-pay

## 2018-06-04 DIAGNOSIS — Z1211 Encounter for screening for malignant neoplasm of colon: Secondary | ICD-10-CM

## 2018-06-04 DIAGNOSIS — E782 Mixed hyperlipidemia: Secondary | ICD-10-CM

## 2018-06-04 DIAGNOSIS — Z1212 Encounter for screening for malignant neoplasm of rectum: Principal | ICD-10-CM

## 2018-06-04 LAB — POC HEMOCCULT BLD/STL (HOME/3-CARD/SCREEN)
Card #2 Fecal Occult Blod, POC: NEGATIVE
Card #3 Fecal Occult Blood, POC: NEGATIVE
Fecal Occult Blood, POC: NEGATIVE

## 2018-06-04 MED ORDER — ROSUVASTATIN CALCIUM 40 MG PO TABS: ORAL_TABLET | ORAL | 3 refills | Status: DC

## 2018-06-05 DIAGNOSIS — Z1211 Encounter for screening for malignant neoplasm of colon: Secondary | ICD-10-CM | POA: Diagnosis not present

## 2018-06-17 DIAGNOSIS — R51 Headache: Secondary | ICD-10-CM | POA: Diagnosis not present

## 2018-06-30 ENCOUNTER — Encounter: Payer: Self-pay | Admitting: Internal Medicine

## 2018-06-30 NOTE — Progress Notes (Signed)
  Subjective:    Patient ID: Francisco Dixon, male    DOB: November 08, 1950, 68 y.o.   MRN: [id number]  HPI   This very nice 68 yo [name] with hx/o labile HTN, HLD, PreDM, Vit D deficiency  presents with c/o 3-4 week hx/o of mild equilibrium or imbalance sensation usually associated with bending over or squat to standing position. No definite spinning vertigo or associated N/V. No CP, palpitations or respiratory sx's.   Outpatient Medications Prior to Visit  Medication Sig Dispense Refill  . aspirin EC 81 MG tablet Take 81 mg by mouth daily.    Marland Kitchen buPROPion (WELLBUTRIN XL) 150 MG 24 hr tablet Take 1 tablet every morning for Mood 90 tablet 1  . Cholecalciferol (VITAMIN D3) 5000 units CAPS Takes 2 caps (10,000 Units) daily    . EPINEPHrine (EPIPEN) 0.3 mg/0.3 mL SOAJ injection Inject 0.3 mg into the muscle once.    . Flaxseed, Linseed, (FLAXSEED OIL) 1000 MG CAPS Take 2 capsules by mouth daily.    . meloxicam (MOBIC) 15 MG tablet Take 1/2 to 1 tablet daily with food for Pain & Inflammation - try limit to 5 days /week to avoid kidney damage 90 tablet 3  . Omega-3 Fatty Acids (FISH OIL) 1000 MG CAPS Takes 1 capsule daily  0  . rosuvastatin (CRESTOR) 40 MG tablet Take 1 tablet daily for Cholesterol 90 tablet 3  . terbinafine (LAMISIL) 250 MG tablet TAKE 1 TABLET(250 MG) BY MOUTH DAILY 90 tablet 0  . sertraline (ZOLOFT) 100 MG tablet Take 1 tablet daily for Mood 90 tablet 3   No facility-administered medications prior to visit.    Allergies  Allergen Reactions  . Other     Bee stings   Past Medical History:  Diagnosis Date  . GERD (gastroesophageal reflux disease)   . Hypercholesterolemia   . Hypertension    labile   . Vitamin D deficiency    Past Surgical History:  Procedure Laterality Date  . COLONOSCOPY N/A 2002,2007,2012  . ROTATOR CUFF REPAIR Right 2015   Review of Systems    10 point systems review negative except as above.    Objective:   Physical Exam  BP 122/72   P64   T 97.5 F   R 16   Ht 5\' 7"     Wt 164 lb 9.6 oz    BMI 25.78   Postural      Sitting BP 141/79    P  66          &          Standing BP 129/89     P67  HEENT - WNL. EAc's Clear& TM's Nl Neck - supple.  Chest - Clear equal BS. Cor - Nl HS. RRR w/o sig MGR. PP 1(+). No edema. MS- FROM w/o deformities.  Gait Nl. Neuro -  Nl w/o focal abnormalities.    Assessment & Plan:   1. Dizziness  - CBC with Differential/Platelet - COMPLETE METABOLIC PANEL WITH GFR  - meclizine  25 MG tablet; Take 1/2 to 1 tablet 2 to 3 x /day as needed for Dizziness / Vertigo  Disp: 90 tab; Rf: 11  2. Labile hypertension  - CBC with Differential/Platelet - COMPLETE METABOLIC PANEL WITH GFR  3. Medication management  - CBC with Differential/Platelet - COMPLETE METABOLIC PANEL WITH GFR  - Discussed meds/SE's & return or ER precautions.

## 2018-06-30 NOTE — Patient Instructions (Signed)
Dizziness Dizziness is a common problem. It is a feeling of unsteadiness or light-headedness. You may feel like you are about to faint. Dizziness can lead to injury if you stumble or fall. Anyone can become dizzy, but dizziness is more common in older adults. This condition can be caused by a number of things, including medicines, dehydration, or illness. Follow these instructions at home: Eating and drinking  Drink enough fluid to keep your urine clear or pale yellow. This helps to keep you from becoming dehydrated. Try to drink more clear fluids, such as water.  Do not drink alcohol.  Limit your caffeine intake if told to do so by your health care provider. Check ingredients and nutrition facts to see if a food or beverage contains caffeine.  Limit your salt (sodium) intake if told to do so by your health care provider. Check ingredients and nutrition facts to see if a food or beverage contains sodium. Activity  Avoid making quick movements. ? Rise slowly from chairs and steady yourself until you feel okay. ? In the morning, first sit up on the side of the bed. When you feel okay, stand slowly while you hold onto something until you know that your balance is fine.  If you need to stand in one place for a long time, move your legs often. Tighten and relax the muscles in your legs while you are standing.  Do not drive or use heavy machinery if you feel dizzy.  Avoid bending down if you feel dizzy. Place items in your home so that they are easy for you to reach without leaning over. Lifestyle  Do not use any products that contain nicotine or tobacco, such as cigarettes and e-cigarettes. If you need help quitting, ask your health care provider.  Try to reduce your stress level by using methods such as yoga or meditation. Talk with your health care provider if you need help to manage your stress. General instructions  Watch your dizziness for any changes.  Take over-the-counter and  prescription medicines only as told by your health care provider. Talk with your health care provider if you think that your dizziness is caused by a medicine that you are taking.  Tell a friend or a family member that you are feeling dizzy. If he or she notices any changes in your behavior, have this person call your health care provider.  Keep all follow-up visits as told by your health care provider. This is important. Contact a health care provider if:  Your dizziness does not go away.  Your dizziness or light-headedness gets worse.  You feel nauseous.  You have reduced hearing.  You have new symptoms.  You are unsteady on your feet or you feel like the room is spinning. Get help right away if:  You vomit or have diarrhea and are unable to eat or drink anything.  You have problems talking, walking, swallowing, or using your arms, hands, or legs.  You feel generally weak.  You are not thinking clearly or you have trouble forming sentences. It may take a friend or family member to notice this.  You have chest pain, abdominal pain, shortness of breath, or sweating.  Your vision changes.  You have any bleeding.  You have a severe headache.  You have neck pain or a stiff neck.  You have a fever. These symptoms may represent a serious problem that is an emergency. Do not wait to see if the symptoms will go away. Get medical help   right away. Call your local emergency services (911 in the [place] Do not drive yourself to the hospital. Summary  Dizziness is a feeling of unsteadiness or light-headedness. This condition can be caused by a number of things, including medicines, dehydration, or illness.  Anyone can become dizzy, but dizziness is more common in older adults.  Drink enough fluid to keep your urine clear or pale yellow. Do not drink alcohol.  Avoid making quick movements if you feel dizzy. Monitor your dizziness for any changes. ++++++++++++++++++++++++++++++

## 2018-07-01 ENCOUNTER — Other Ambulatory Visit: Payer: Self-pay

## 2018-07-01 ENCOUNTER — Ambulatory Visit (INDEPENDENT_AMBULATORY_CARE_PROVIDER_SITE_OTHER): Payer: Medicare Other | Admitting: Internal Medicine

## 2018-07-01 VITALS — BP 122/72 | HR 64 | Temp 97.5°F | Resp 16 | Ht 67.0 in | Wt 164.6 lb

## 2018-07-01 DIAGNOSIS — Z79899 Other long term (current) drug therapy: Secondary | ICD-10-CM

## 2018-07-01 DIAGNOSIS — R42 Dizziness and giddiness: Secondary | ICD-10-CM

## 2018-07-01 DIAGNOSIS — R0989 Other specified symptoms and signs involving the circulatory and respiratory systems: Secondary | ICD-10-CM

## 2018-07-01 LAB — CBC WITH DIFFERENTIAL/PLATELET
Absolute Monocytes: 387 cells/uL (ref 200–950)
Basophils Absolute: 29 cells/uL (ref 0–200)
Basophils Relative: 0.6 %
Eosinophils Absolute: 221 cells/uL (ref 15–500)
Eosinophils Relative: 4.5 %
HCT: 42.7 % (ref 38.5–50.0)
Hemoglobin: 14.9 g/dL (ref 13.2–17.1)
Lymphs Abs: 1063 cells/uL (ref 850–3900)
MCH: 31.2 pg (ref 27.0–33.0)
MCHC: 34.9 g/dL (ref 32.0–36.0)
MCV: 89.5 fL (ref 80.0–100.0)
MONOS PCT: 7.9 %
MPV: 10.3 fL (ref 7.5–12.5)
Neutro Abs: 3200 cells/uL (ref 1500–7800)
Neutrophils Relative %: 65.3 %
PLATELETS: 265 10*3/uL (ref 140–400)
RBC: 4.77 10*6/uL (ref 4.20–5.80)
RDW: 12.7 % (ref 11.0–15.0)
Total Lymphocyte: 21.7 %
WBC: 4.9 10*3/uL (ref 3.8–10.8)

## 2018-07-01 LAB — COMPLETE METABOLIC PANEL WITH GFR
AG Ratio: 1.7 (calc) (ref 1.0–2.5)
ALT: 20 U/L (ref 9–46)
AST: 19 U/L (ref 10–35)
Albumin: 4.4 g/dL (ref 3.6–5.1)
Alkaline phosphatase (APISO): 49 U/L (ref 35–144)
BUN: 23 mg/dL (ref 7–25)
CO2: 31 mmol/L (ref 20–32)
Calcium: 9.8 mg/dL (ref 8.6–10.3)
Chloride: 104 mmol/L (ref 98–110)
Creat: 1.12 mg/dL (ref 0.70–1.25)
GFR, Est African American: 78 mL/min/{1.73_m2} (ref >=60)
GFR, Est Non African American: 67 mL/min/{1.73_m2} (ref >=60)
Globulin: 2.6 g/dL (calc) (ref 1.9–3.7)
Glucose, Bld: 104 mg/dL — ABNORMAL HIGH (ref 65–99)
Potassium: 3.9 mmol/L (ref 3.5–5.3)
Sodium: 141 mmol/L (ref 135–146)
Total Bilirubin: 0.5 mg/dL (ref 0.2–1.2)
Total Protein: 7 g/dL (ref 6.1–8.1)

## 2018-07-01 MED ORDER — MECLIZINE HCL 25 MG PO TABS: ORAL_TABLET | ORAL | 11 refills | Status: DC

## 2018-07-15 ENCOUNTER — Other Ambulatory Visit: Payer: Self-pay | Admitting: Internal Medicine

## 2018-07-15 DIAGNOSIS — B351 Tinea unguium: Secondary | ICD-10-CM

## 2018-07-16 ENCOUNTER — Other Ambulatory Visit: Payer: Self-pay | Admitting: Adult Health

## 2018-07-16 MED ORDER — EPINEPHRINE 0.3 MG/0.3ML IJ SOAJ: 0.3000 mg | Freq: Once | INTRAMUSCULAR | 0 refills | Status: DC

## 2018-07-29 ENCOUNTER — Other Ambulatory Visit: Payer: Self-pay | Admitting: Internal Medicine

## 2018-07-29 DIAGNOSIS — F3341 Major depressive disorder, recurrent, in partial remission: Secondary | ICD-10-CM

## 2018-08-01 NOTE — Progress Notes (Signed)
MEDICARE ANNUAL WELLNESS VISIT AND FOLLOW UP Assessment:   Diagnoses and all orders for this visit:  Encounter for Medicare annual wellness exam  Labile hypertension Currently at goal without medications Monitor blood pressure at home; call if consistently over 130/80 Continue DASH diet.   Reminder to go to the ER if any CP, SOB, nausea, dizziness, severe HA, changes vision/speech, left arm numbness and tingling and jaw pain.  Vitamin D deficiency At goal at recent check; continue to recommend supplementation for goal of 70-100 Defer vitamin D level  Mixed hyperlipidemia Continue medications Continue low cholesterol diet and exercise.  Check lipid panel.   Other abnormal glucose (prediabetes) Discussed disease and risks Discussed diet/exercise, weight management  Monitor A1C  Allergic state, sequela Has epi pen for bee stings; reminded to follow up at ER   BMI 25, adult Continue to recommend diet heavy in fruits and veggies and low in animal meats, cheeses, and dairy products, appropriate calorie intake Discuss exercise recommendations routinely Continue to monitor weight at each visit  Medication management Check CBC, CMP/GFR  Onychomycosis Finishing up second round of lamisil with improved symptoms; check CMP  Numbness/tingling bilateral lower extremities Mild; patient declines meds or further workup  Over 30 minutes of exam, counseling, chart review, and critical decision making was performed  Future Appointments  Date Time Provider [place]  11/08/2018  9:30 AM Unk Pinto, MD [facility] None  05/15/2019  9:00 AM Unk Pinto, MD [facility] None     Plan:   During the course of the visit the patient was educated and counseled about appropriate screening and preventive services including:    Pneumococcal vaccine   Influenza vaccine  Prevnar 13  Td vaccine  Screening electrocardiogram  Colorectal cancer screening  Diabetes  screening  Glaucoma screening  Nutrition counseling    Subjective:  Francisco Dixon is a 68 y.o. male who presents for Medicare Annual Wellness Visit and 3 month follow up for HTN, hyperlipidemia, prediabetes, and vitamin D Def.   he has a diagnosis of depression and is currently on wellbutrin 150 mg daily, reports symptoms are well controlled on current regimen.   He is currently on lamisil 1 month on 1 month off for recurrent toenail fungus which is improved.  He has persistent numbness/tingling of bilateral feet, not significantly limiting him. Questionably has been attributed to DDD seen on xray but he declines interventions or work up.  BMI is Body mass index is 25.53 kg/m., he has been working on diet and exercise, very active building deck, installing kitchen cabinets, etc.  Wt Readings from Last 3 Encounters:  08/05/18 163 lb (73.9 kg)  07/01/18 164 lb 9.6 oz (74.7 kg)  04/29/18 164 lb 12.8 oz (74.8 kg)   His blood pressure has been controlled at home, today their BP is BP: 120/82 He does workout. He denies chest pain, shortness of breath, dizziness.   He is on cholesterol medication (crestor 20 mg every other day) and denies myalgias. His cholesterol is not at goal. The cholesterol last visit was:   Lab Results  Component Value Date   CHOL 176 04/29/2018   HDL 51 04/29/2018   LDLCALC 105 (H) 04/29/2018   TRIG 104 04/29/2018   CHOLHDL 3.5 04/29/2018   He has been working on diet and exercise for prediabetes, and denies foot ulcerations, increased appetite, nausea, paresthesia of the feet, polydipsia, polyuria, visual disturbances, vomiting and weight loss. Last A1C in the office was:  Lab Results  Component Value  Date   HGBA1C 5.9 (H) 04/29/2018   Last GFR Lab Results  Component Value Date   GFRNONAA 67 07/01/2018    Patient is on Vitamin D supplement and at goal at recent check:   Lab Results  Component Value Date   VD25OH 89 04/29/2018      Medication  Review: Current Outpatient Medications on File Prior to Visit  Medication Sig Dispense Refill  . aspirin EC 81 MG tablet Take 81 mg by mouth daily.    Marland Kitchen buPROPion (WELLBUTRIN XL) 150 MG 24 hr tablet Take 1 tablet Daily for Mood 90 tablet 3  . Cholecalciferol (VITAMIN D3) 5000 units CAPS Takes 2 caps (10,000 Units) daily    . Flaxseed, Linseed, (FLAXSEED OIL) 1000 MG CAPS Take 2 capsules by mouth daily.    . meclizine (ANTIVERT) 25 MG tablet Take 1/2 to 1 tablet 2 to 3 x /day as needed for Dizziness / Vertigo 90 tablet 11  . meloxicam (MOBIC) 15 MG tablet Take 1/2 to 1 tablet daily with food for Pain & Inflammation - try limit to 5 days /week to avoid kidney damage 90 tablet 3  . Omega-3 Fatty Acids (FISH OIL) 1000 MG CAPS Takes 1 capsule daily  0  . rosuvastatin (CRESTOR) 40 MG tablet Take 1 tablet daily for Cholesterol 90 tablet 3  . terbinafine (LAMISIL) 250 MG tablet TAKE 1 TABLET(250 MG) BY MOUTH DAILY 90 tablet 0   No current facility-administered medications on file prior to visit.     Allergies: Allergies  Allergen Reactions  . Other     Bee stings    Current Problems (verified) has Labile hypertension; Hyperlipidemia, mixed; Vitamin D deficiency; Medication management; Abnormal glucose (prediabetes); Allergy; Major depression in full remission ([place]); and Numbness and tingling of both feet on their problem list.  Screening Tests Immunization History  Administered Date(s) Administered  . Pneumococcal Conjugate-13 04/29/2018  . Td 01/03/2017   Preventative care: Last colonoscopy:  August 2017  Prior vaccinations: TD or Tdap: 01/2017  Influenza: declines Pneumococcal: due 04/2019 Prevnar13: 04/2018 Shingles/Zostavax: declines  Names of Other Physician/Practitioners you currently use: 1. [facility] here for primary care 2. Dr. ?, eye doctor, last visit 2020 3.  Dr. Marland Kitchen dentist, last visit 2019, goes q37m  Patient Care  Team: Unk Pinto, MD as PCP - General (Internal Medicine)  Surgical: He  has a past surgical history that includes Colonoscopy (N/A, 2002,2007,2012) and Rotator cuff repair (Right, 2015). Family His family history is not on file. Social history  He reports that he has never smoked. He has never used smokeless tobacco. He reports current alcohol use. He reports that he does not use drugs.  MEDICARE WELLNESS OBJECTIVES: Physical activity: Current Exercise Habits: The patient has a physically strenuous job, but has no regular exercise apart from work.(very active around house, building deck, heavy yardwork), Exercise limited by: None identified Cardiac risk factors: Cardiac Risk Factors include: advanced age (>39men, >89 women);male gender;dyslipidemia;hypertension Depression/mood screen:   Depression screen [facility] 2/9 08/05/2018  Decreased Interest 0  Down, Depressed, Hopeless 0  PHQ - 2 Score 0    ADLs:  In your present state of health, do you have any difficulty performing the following activities: 08/05/2018 06/30/2018  Hearing? N N  Comment has bilateral hearing aids -  Vision? N Y  Comment - Bilat hearing aids  Difficulty concentrating or making decisions? N N  Walking or climbing stairs? N N  Dressing or bathing? N  N  Doing errands, shopping? N N  Some recent data might be hidden     Cognitive Testing  Alert? Yes  Normal Appearance?Yes  Oriented to person? Yes  Place? Yes   Time? Yes  Recall of three objects?  Yes  Can perform simple calculations? Yes  Displays appropriate judgment?Yes  Can read the correct time from a watch face?Yes  EOL planning: Does Patient Have a Medical Advance Directive?: No Would patient like information on creating a medical advance directive?: No - Patient declined(has paperwork, needs to be notarized )   Objective:   Today's Vitals   08/05/18 0858  BP: 120/82  Pulse: 71  Temp: (!) 97.3 F (36.3 C)  SpO2: 98%  Weight: 163 lb (73.9  kg)  Height: 5\' 7"  (1.702 m)   Body mass index is 25.53 kg/m.  General appearance: alert, no distress, WD/WN, male HEENT: normocephalic, sclerae anicteric, TMs pearly, nares patent, no discharge or erythema, pharynx normal. HOH with bilateral hearing aids.  Oral cavity: MMM, no lesions Neck: supple, no lymphadenopathy, no thyromegaly, no masses Heart: RRR, normal S1, S2, no murmurs Lungs: CTA bilaterally, no wheezes, rhonchi, or rales Abdomen: +bs, soft, non tender, non distended, no masses, no hepatomegaly, no splenomegaly Musculoskeletal: nontender, no swelling, no obvious deformity Extremities: no edema, no cyanosis, no clubbing Pulses: 2+ symmetric, upper and lower extremities, normal cap refill Neurological: alert, oriented x 3, CN2-12 intact, strength normal upper extremities and lower extremities, sensation normal throughout, DTRs 2+ throughout, no cerebellar signs, gait normal Psychiatric: normal affect, behavior normal, pleasant   Medicare Attestation I have personally reviewed: The patient's medical and social history Their use of alcohol, tobacco or illicit drugs Their current medications and supplements The patient's functional ability including ADLs,fall risks, home safety risks, cognitive, and hearing and visual impairment Diet and physical activities Evidence for depression or mood disorders  The patient's weight, height, BMI, and visual acuity have been recorded in the chart.  I have made referrals, counseling, and provided education to the patient based on review of the above and I have provided the patient with a written personalized care plan for preventive services.     Izora Ribas, NP   08/05/2018

## 2018-08-05 ENCOUNTER — Ambulatory Visit (INDEPENDENT_AMBULATORY_CARE_PROVIDER_SITE_OTHER): Payer: Medicare Other | Admitting: Adult Health

## 2018-08-05 ENCOUNTER — Other Ambulatory Visit: Payer: Self-pay

## 2018-08-05 ENCOUNTER — Encounter: Payer: Self-pay | Admitting: Adult Health

## 2018-08-05 VITALS — BP 120/82 | HR 71 | Temp 97.3°F | Ht 67.0 in | Wt 163.0 lb

## 2018-08-05 DIAGNOSIS — R6889 Other general symptoms and signs: Secondary | ICD-10-CM

## 2018-08-05 DIAGNOSIS — Z0001 Encounter for general adult medical examination with abnormal findings: Secondary | ICD-10-CM

## 2018-08-05 DIAGNOSIS — E559 Vitamin D deficiency, unspecified: Secondary | ICD-10-CM

## 2018-08-05 DIAGNOSIS — T7840XS Allergy, unspecified, sequela: Secondary | ICD-10-CM

## 2018-08-05 DIAGNOSIS — B351 Tinea unguium: Secondary | ICD-10-CM

## 2018-08-05 DIAGNOSIS — R7309 Other abnormal glucose: Secondary | ICD-10-CM

## 2018-08-05 DIAGNOSIS — M722 Plantar fascial fibromatosis: Secondary | ICD-10-CM

## 2018-08-05 DIAGNOSIS — Z0000 Encounter for general adult medical examination without abnormal findings: Secondary | ICD-10-CM

## 2018-08-05 DIAGNOSIS — E782 Mixed hyperlipidemia: Secondary | ICD-10-CM

## 2018-08-05 DIAGNOSIS — R202 Paresthesia of skin: Secondary | ICD-10-CM

## 2018-08-05 DIAGNOSIS — F3342 Major depressive disorder, recurrent, in full remission: Secondary | ICD-10-CM | POA: Diagnosis not present

## 2018-08-05 DIAGNOSIS — Z6825 Body mass index (BMI) 25.0-25.9, adult: Secondary | ICD-10-CM

## 2018-08-05 DIAGNOSIS — R200 Anesthesia of skin: Secondary | ICD-10-CM | POA: Diagnosis not present

## 2018-08-05 DIAGNOSIS — Z79899 Other long term (current) drug therapy: Secondary | ICD-10-CM

## 2018-08-05 DIAGNOSIS — R0989 Other specified symptoms and signs involving the circulatory and respiratory systems: Secondary | ICD-10-CM

## 2018-08-05 NOTE — Patient Instructions (Addendum)
  Francisco Dixon , Thank you for taking time to come for your Medicare Wellness Visit. I appreciate your ongoing commitment to your health goals. Please review the following plan we discussed and let me know if I can assist you in the future.   These are the goals we discussed: Goals    . Exercise 150 min/wk Moderate Activity    . HEMOGLOBIN A1C < 5.7       This is a list of the screening recommended for you and due dates:  Health Maintenance  Topic Date Due  .  Hepatitis C: One time screening is recommended by Center for Disease Control  (CDC) for  adults born from [birth year] through 1965.   05/29/1950  . Pneumonia vaccines (2 of 2 - PPSV23) 04/30/2019  . Colon Cancer Screening  11/15/2025  . Tetanus Vaccine  01/04/2027  . Flu Shot  Discontinued

## 2018-08-06 LAB — COMPLETE METABOLIC PANEL WITH GFR
AG Ratio: 1.6 (calc) (ref 1.0–2.5)
ALT: 16 U/L (ref 9–46)
AST: 17 U/L (ref 10–35)
Albumin: 4.3 g/dL (ref 3.6–5.1)
Alkaline phosphatase (APISO): 50 U/L (ref 35–144)
BUN: 19 mg/dL (ref 7–25)
CO2: 28 mmol/L (ref 20–32)
Calcium: 10 mg/dL (ref 8.6–10.3)
Chloride: 107 mmol/L (ref 98–110)
Creat: 1.09 mg/dL (ref 0.70–1.25)
GFR, Est African American: 80 mL/min/{1.73_m2} (ref >=60)
GFR, Est Non African American: 69 mL/min/{1.73_m2} (ref >=60)
Globulin: 2.7 g/dL (calc) (ref 1.9–3.7)
Glucose, Bld: 83 mg/dL (ref 65–99)
Potassium: 4.1 mmol/L (ref 3.5–5.3)
Sodium: 142 mmol/L (ref 135–146)
Total Bilirubin: 0.5 mg/dL (ref 0.2–1.2)
Total Protein: 7 g/dL (ref 6.1–8.1)

## 2018-08-06 LAB — HEMOGLOBIN A1C
Hgb A1c MFr Bld: 5.6 % of total Hgb (ref <5.7)
Mean Plasma Glucose: 114 (calc)
eAG (mmol/L): 6.3 (calc)

## 2018-08-06 LAB — CBC WITH DIFFERENTIAL/PLATELET
Absolute Monocytes: 418 cells/uL (ref 200–950)
Basophils Absolute: 41 cells/uL (ref 0–200)
Basophils Relative: 1 %
Eosinophils Absolute: 279 cells/uL (ref 15–500)
Eosinophils Relative: 6.8 %
HCT: 40.3 % (ref 38.5–50.0)
Hemoglobin: 13.8 g/dL (ref 13.2–17.1)
Lymphs Abs: 1115 cells/uL (ref 850–3900)
MCH: 30.7 pg (ref 27.0–33.0)
MCHC: 34.2 g/dL (ref 32.0–36.0)
MCV: 89.6 fL (ref 80.0–100.0)
MPV: 9.9 fL (ref 7.5–12.5)
Monocytes Relative: 10.2 %
Neutro Abs: 2247 cells/uL (ref 1500–7800)
Neutrophils Relative %: 54.8 %
Platelets: 247 10*3/uL (ref 140–400)
RBC: 4.5 10*6/uL (ref 4.20–5.80)
RDW: 12.4 % (ref 11.0–15.0)
Total Lymphocyte: 27.2 %
WBC: 4.1 10*3/uL (ref 3.8–10.8)

## 2018-08-06 LAB — LIPID PANEL
Cholesterol: 149 mg/dL (ref <200)
HDL: 41 mg/dL (ref >=40)
LDL Cholesterol (Calc): 85 mg/dL (calc)
Non-HDL Cholesterol (Calc): 108 mg/dL (calc) (ref <130)
Total CHOL/HDL Ratio: 3.6 (calc) (ref <5.0)
Triglycerides: 131 mg/dL (ref <150)

## 2018-08-06 LAB — TSH: TSH: 3.01 mIU/L (ref 0.40–4.50)

## 2018-10-08 NOTE — Progress Notes (Signed)
[facility]   Unk Pinto, M.D.    Uvaldo Bristle. Silverio Lay, P.A.-C      Liane Comber, [place]                [address] [place] [address] Telephone [phone] Telefax [phone] _________________________________  History of Present Illness:      Patient I a very nice 68 yo [name] with HTN, HLD, Pre-Diabetes and Vitamin D Deficiency  presenting with several day hx/o a "lump of his Left testicle that has been aching.   Medications  Current Outpatient Medications (Cardiovascular):  .  rosuvastatin (CRESTOR) 40 MG tablet, Take 1 tablet daily for Cholesterol .  aspirin EC 81 MG tablet, Take 81 mg by mouth daily. .  meloxicam (MOBIC) 15 MG tablet, Take 1/2 to 1 tablet daily with food for Pain & Inflammation - try limit to 5 days /week to avoid kidney damage  .  buPROPion (WELLBUTRIN XL) 150 MG 24 hr tablet, Take 1 tablet Daily for Mood .  Cholecalciferol (VITAMIN D3) 5000 units CAPS, Takes 2 caps (10,000 Units) daily .  Flaxseed, Linseed, (FLAXSEED OIL) 1000 MG CAPS, Take 2 capsules by mouth daily. .  meclizine (ANTIVERT) 25 MG tablet, Take 1/2 to 1 tablet 2 to 3 x /day as needed for Dizziness / Vertigo .  Omega-3 Fatty Acids (FISH OIL) 1000 MG CAPS, Takes 1 capsule daily .  terbinafine (LAMISIL) 250 MG tablet, TAKE 1 TABLET(250 MG) BY MOUTH DAILY  Problem list He has Labile hypertension; Hyperlipidemia, mixed; Vitamin D deficiency; Medication management; Abnormal glucose (prediabetes); Allergy; Major depression in full remission ([place]); Numbness and tingling of both feet; and Onychomycosis on their problem list.   Observations/Objective:  BP 140/90   Pulse 72   Temp (!) 97 F (36.1 C)   Resp 16   Ht 5\' 7"  (1.702 m)   Wt 159 lb 3.2 oz (72.2 kg)   BMI 24.93 kg/m   HEENT - WNL. Neck - supple.  Chest - Clear equal BS. Cor - Nl HS. RRR w/o sig MGR. PP 1(+). No edema. GU Rt  testes /cord unremarkable. Left testes & cords normal with epididymis seeming swollen, cystic & tender MS- FROM w/o deformities.  Gait Nl. Neuro -  Nl w/o focal abnormalities.  Skin - there is a raised 10 mg crusty brown excoriated lesion about the Left elbow.   Procedure (CPT - 17000)   After informed consent and aseptic prep with alcohol, the lesion was anesthetized with 1 ml of Marcaine 0.5%. then with a # 10 scalpel, the lesion was deeply excised by excisional shave technique and then the wound base was hyfrecated for for hemostasis and electro- destruction of any residual lesion. Lesion appearing to be an inflamed seborrheic keratosis was discarded.   Assessment and Plan:  1. Left epididymitis  - doxycycline (VIBRAMYCIN) 100 MG capsule; Take 1 capsule 2 x /day for 2 weeks, then 1 x day for 2 weeks - Take with food  Dispense: 42 capsule; Refill: 0  2. Neoplasm of uncertain behavior of skin  Follow Up Instructions: wound care advised       I discussed the assessment and treatment plan with the patient. The patient was provided an opportunity to ask questions and all were answered. The patient agreed with the plan and demonstrated an understanding of the instructions.  Kirtland Bouchard, MD

## 2018-10-09 ENCOUNTER — Ambulatory Visit (INDEPENDENT_AMBULATORY_CARE_PROVIDER_SITE_OTHER): Payer: Medicare Other | Admitting: Internal Medicine

## 2018-10-09 ENCOUNTER — Other Ambulatory Visit: Payer: Self-pay

## 2018-10-09 VITALS — BP 140/90 | HR 72 | Temp 97.0°F | Resp 16 | Ht 67.0 in | Wt 159.2 lb

## 2018-10-09 DIAGNOSIS — N451 Epididymitis: Secondary | ICD-10-CM

## 2018-10-09 DIAGNOSIS — D485 Neoplasm of uncertain behavior of skin: Secondary | ICD-10-CM | POA: Diagnosis not present

## 2018-10-09 MED ORDER — DOXYCYCLINE HYCLATE 100 MG PO CAPS: ORAL_CAPSULE | ORAL | 0 refills | Status: DC

## 2018-10-12 ENCOUNTER — Encounter: Payer: Self-pay | Admitting: Internal Medicine

## 2018-11-08 ENCOUNTER — Ambulatory Visit: Payer: Self-pay | Admitting: Internal Medicine

## 2018-11-08 NOTE — Progress Notes (Signed)
FOLLOW UP Assessment and Plan    Labile hypertension - continue medications, DASH diet, exercise and monitor at home. Call if greater than 130/80.  -     CBC with Differential/Platelet -     COMPLETE METABOLIC PANEL WITH GFR -     TSH  Hyperlipidemia, mixed check lipids decrease fatty foods increase activity.  -     Lipid panel  Medication management -     Magnesium  Abnormal glucose (prediabetes) -     Hemoglobin A1c  Recurrent major depressive disorder, in full remission ([place]) Continue meds  B12 deficiency -     Vitamin B12   Over 30 minutes of exam, counseling, chart review, and critical decision making was performed  Future Appointments  Date Time Provider [place]  05/15/2019  9:00 AM Unk Pinto, MD [facility] None  08/18/2019  9:00 AM Liane Comber, NP [facility] None     Subjective:  Francisco Dixon is a 68 y.o. male who presents for 3 month follow up for HTN, hyperlipidemia, prediabetes, and vitamin D Def.   His blood pressure has been controlled at home, today their BP is BP: 122/80 He does not workout. He denies chest pain, shortness of breath, dizziness.  Doing well on wellbutrin.  Continues to have bilateral feet numbness.  No results found for: VITAMINB12  He is on cholesterol medication and denies myalgias. His cholesterol is at goal. The cholesterol last visit was:   Lab Results  Component Value Date   CHOL 149 08/05/2018   HDL 41 08/05/2018   LDLCALC 85 08/05/2018   TRIG 131 08/05/2018   CHOLHDL 3.6 08/05/2018   :  Lab Results  Component Value Date   HGBA1C 5.6 08/05/2018   Last GFR Lab Results  Component Value Date   GFRNONAA 69 08/05/2018   Patient is on Vitamin D supplement.   Lab Results  Component Value Date   VD25OH 89 04/29/2018     BMI is Body mass index is 24.97 kg/m., he is working on diet and exercise. Wt Readings from Last 3 Encounters:  11/11/18 159 lb 6.4 oz (72.3 kg)  10/09/18 159 lb 3.2 oz (72.2  kg)  08/05/18 163 lb (73.9 kg)    Medication Review: Current Outpatient Medications on File Prior to Visit  Medication Sig Dispense Refill  . aspirin EC 81 MG tablet Take 81 mg by mouth daily.    Marland Kitchen buPROPion (WELLBUTRIN XL) 150 MG 24 hr tablet Take 1 tablet Daily for Mood 90 tablet 3  . Cholecalciferol (VITAMIN D3) 5000 units CAPS Takes 2 caps (10,000 Units) daily    . Flaxseed, Linseed, (FLAXSEED OIL) 1000 MG CAPS Take 2 capsules by mouth daily.    . meclizine (ANTIVERT) 25 MG tablet Take 1/2 to 1 tablet 2 to 3 x /day as needed for Dizziness / Vertigo 90 tablet 11  . meloxicam (MOBIC) 15 MG tablet Take 1/2 to 1 tablet daily with food for Pain & Inflammation - try limit to 5 days /week to avoid kidney damage 90 tablet 3  . Omega-3 Fatty Acids (FISH OIL) 1000 MG CAPS Takes 1 capsule daily  0  . rosuvastatin (CRESTOR) 40 MG tablet Take 1 tablet daily for Cholesterol 90 tablet 3  . terbinafine (LAMISIL) 250 MG tablet TAKE 1 TABLET(250 MG) BY MOUTH DAILY 90 tablet 0   No current facility-administered medications on file prior to visit.     Allergies: Allergies  Allergen Reactions  . Other  Bee stings    Current Problems (verified) has Labile hypertension; Hyperlipidemia, mixed; Vitamin D deficiency; Medication management; Abnormal glucose (prediabetes); Allergy; Major depression in full remission ([place]); Numbness and tingling of both feet; and Onychomycosis on their problem list.  Surgical History: reviewed and unchanged Family History: reviewed and unchanged Social History: reviewed and unchanged   Objective:   Today's Vitals   11/11/18 1107  BP: 122/80  Pulse: 63  Temp: (!) 97 F (36.1 C)  SpO2: 98%  Weight: 159 lb 6.4 oz (72.3 kg)  Height: 5\' 7"  (1.702 m)   Body mass index is 24.97 kg/m.  General appearance: alert, no distress, WD/WN, male HEENT: normocephalic, sclerae anicteric, TMs pearly, nares patent, no discharge or erythema, pharynx normal Oral cavity: MMM,  no lesions Neck: supple, no lymphadenopathy, no thyromegaly, no masses Heart: RRR, normal S1, S2, no murmurs Lungs: CTA bilaterally, no wheezes, rhonchi, or rales Abdomen: +bs, soft, non tender, non distended, no masses, no hepatomegaly, no splenomegaly Musculoskeletal: nontender, no swelling, no obvious deformity Extremities: no edema, no cyanosis, no clubbing Pulses: 2+ symmetric, upper and lower extremities, normal cap refill Neurological: alert, oriented x 3, CN2-12 intact, strength normal upper extremities and lower extremities, sensation normal throughout, DTRs 2+ throughout, no cerebellar signs, gait normal Psychiatric: normal affect, behavior normal, pleasant     Vicie Mutters, PA-C   11/11/2018

## 2018-11-11 ENCOUNTER — Encounter: Payer: Self-pay | Admitting: Physician Assistant

## 2018-11-11 ENCOUNTER — Other Ambulatory Visit: Payer: Self-pay

## 2018-11-11 ENCOUNTER — Ambulatory Visit (INDEPENDENT_AMBULATORY_CARE_PROVIDER_SITE_OTHER): Payer: Medicare Other | Admitting: Physician Assistant

## 2018-11-11 VITALS — BP 122/80 | HR 63 | Temp 97.0°F | Ht 67.0 in | Wt 159.4 lb

## 2018-11-11 DIAGNOSIS — R0989 Other specified symptoms and signs involving the circulatory and respiratory systems: Secondary | ICD-10-CM

## 2018-11-11 DIAGNOSIS — F3342 Major depressive disorder, recurrent, in full remission: Secondary | ICD-10-CM | POA: Diagnosis not present

## 2018-11-11 DIAGNOSIS — Z79899 Other long term (current) drug therapy: Secondary | ICD-10-CM | POA: Diagnosis not present

## 2018-11-11 DIAGNOSIS — R7309 Other abnormal glucose: Secondary | ICD-10-CM | POA: Diagnosis not present

## 2018-11-11 DIAGNOSIS — E782 Mixed hyperlipidemia: Secondary | ICD-10-CM | POA: Diagnosis not present

## 2018-11-11 DIAGNOSIS — E538 Deficiency of other specified B group vitamins: Secondary | ICD-10-CM

## 2018-11-11 NOTE — Patient Instructions (Signed)
Will start you on wellbutrin to try to help with mood/weight loss If you get nausea and HA after the first week please stop If you get anxious or snappy with people than stop the medication But this medication can help with energy, weight loss and mood It kicks in about 1-2 weeks And can be stopped quickly   Peripheral Neuropathy Peripheral neuropathy is a type of nerve damage. It affects nerves that carry signals between the spinal cord and the arms, legs, and the rest of the body (peripheral nerves). It does not affect nerves in the spinal cord or brain. In peripheral neuropathy, one nerve or a group of nerves may be damaged. Peripheral neuropathy is a broad category that includes many specific nerve disorders, like diabetic neuropathy, hereditary neuropathy, and carpal tunnel syndrome. What are the causes? This condition may be caused by:  Diabetes. This is the most common cause of peripheral neuropathy.  Nerve injury.  Pressure or stress on a nerve that lasts a long time.  Lack (deficiency) of B vitamins. This can result from alcoholism, poor diet, or a restricted diet.  Infections.  Autoimmune diseases, such as rheumatoid arthritis and systemic lupus erythematosus.  Nerve diseases that are passed from parent to child (inherited).  Some medicines, such as cancer medicines (chemotherapy).  Poisonous (toxic) substances, such as lead and mercury.  Too little blood flowing to the legs.  Kidney disease.  Thyroid disease. In some cases, the cause of this condition is not known. What are the signs or symptoms? Symptoms of this condition depend on which of your nerves is damaged. Common symptoms include:  Loss of feeling (numbness) in the feet, hands, or both.  Tingling in the feet, hands, or both.  Burning pain.  Very sensitive skin.  Weakness.  Not being able to move a part of the body (paralysis).  Muscle twitching.  Clumsiness or poor coordination.  Loss of  balance.  Not being able to control your bladder.  Feeling dizzy.  Sexual problems. How is this diagnosed? Diagnosing and finding the cause of peripheral neuropathy can be difficult. Your health care provider will take your medical history and do a physical exam. A neurological exam will also be done. This involves checking things that are affected by your brain, spinal cord, and nerves (nervous system). For example, your health care provider will check your reflexes, how you move, and what you can feel. You may have other tests, such as:  Blood tests.  Electromyogram (EMG) and nerve conduction tests. These tests check nerve function and how well the nerves are controlling the muscles.  Imaging tests, such as CT scans or MRI to rule out other causes of your symptoms.  Removing a small piece of nerve to be examined in a lab (nerve biopsy). This is rare.  Removing and examining a small amount of the fluid that surrounds the brain and spinal cord (lumbar puncture). This is rare. How is this treated? Treatment for this condition may involve:  Treating the underlying cause of the neuropathy, such as diabetes, kidney disease, or vitamin deficiencies.  Stopping medicines that can cause neuropathy, such as chemotherapy.  Medicine to relieve pain. Medicines may include: ? Prescription or over-the-counter pain medicine. ? Antiseizure medicine. ? Antidepressants. ? Pain-relieving patches that are applied to painful areas of skin.  Surgery to relieve pressure on a nerve or to destroy a nerve that is causing pain.  Physical therapy to help improve movement and balance.  Devices to help you move  around (assistive devices). Follow these instructions at home: Medicines  Take over-the-counter and prescription medicines only as told by your health care provider. Do not take any other medicines without first asking your health care provider.  Do not drive or use heavy machinery while taking  prescription pain medicine. Lifestyle   Do not use any products that contain nicotine or tobacco, such as cigarettes and e-cigarettes. Smoking keeps blood from reaching damaged nerves. If you need help quitting, ask your health care provider.  Avoid or limit alcohol. Too much alcohol can cause a vitamin B deficiency, and vitamin B is needed for healthy nerves.  Eat a healthy diet. This includes: ? Eating foods that are high in fiber, such as fresh fruits and vegetables, whole grains, and beans. ? Limiting foods that are high in fat and processed sugars, such as fried or sweet foods. General instructions   If you have diabetes, work closely with your health care provider to keep your blood sugar under control.  If you have numbness in your feet: ? Check every day for signs of injury or infection. Watch for redness, warmth, and swelling. ? Wear padded socks and comfortable shoes. These help protect your feet.  Develop a good support system. Living with peripheral neuropathy can be stressful. Consider talking with a mental health specialist or joining a support group.  Use assistive devices and attend physical therapy as told by your health care provider. This may include using a walker or a cane.  Keep all follow-up visits as told by your health care provider. This is important. Contact a health care provider if:  You have new signs or symptoms of peripheral neuropathy.  You are struggling emotionally from dealing with peripheral neuropathy.  Your pain is not well-controlled. Get help right away if:  You have an injury or infection that is not healing normally.  You develop new weakness in an arm or leg.  You fall frequently. Summary  Peripheral neuropathy is when the nerves in the arms, or legs are damaged, resulting in numbness, weakness, or pain.  There are many causes of peripheral neuropathy, including diabetes, pinched nerves, vitamin deficiencies, autoimmune disease,  and hereditary conditions.  Diagnosing and finding the cause of peripheral neuropathy can be difficult. Your health care provider will take your medical history, do a physical exam, and do tests, including blood tests and nerve function tests.  Treatment involves treating the underlying cause of the neuropathy and taking medicines to help control pain. Physical therapy and assistive devices may also help. This information is not intended to replace advice given to you by your health care provider. Make sure you discuss any questions you have with your health care provider. Document Released: 03/24/2002 Document Revised: 03/16/2017 Document Reviewed: 06/12/2016 Elsevier Patient Education  2020 [employer].

## 2018-11-12 ENCOUNTER — Encounter: Payer: Self-pay | Admitting: Physician Assistant

## 2018-11-12 DIAGNOSIS — E538 Deficiency of other specified B group vitamins: Secondary | ICD-10-CM | POA: Insufficient documentation

## 2018-11-12 LAB — COMPLETE METABOLIC PANEL WITH GFR
AG Ratio: 1.5 (calc) (ref 1.0–2.5)
ALT: 19 U/L (ref 9–46)
AST: 20 U/L (ref 10–35)
Albumin: 4.4 g/dL (ref 3.6–5.1)
Alkaline phosphatase (APISO): 46 U/L (ref 35–144)
BUN: 18 mg/dL (ref 7–25)
CO2: 25 mmol/L (ref 20–32)
Calcium: 9.7 mg/dL (ref 8.6–10.3)
Chloride: 107 mmol/L (ref 98–110)
Creat: 1.09 mg/dL (ref 0.70–1.25)
GFR, Est African American: 80 mL/min/{1.73_m2} (ref >=60)
GFR, Est Non African American: 69 mL/min/{1.73_m2} (ref >=60)
Globulin: 3 g/dL (calc) (ref 1.9–3.7)
Glucose, Bld: 85 mg/dL (ref 65–99)
Potassium: 4.3 mmol/L (ref 3.5–5.3)
Sodium: 141 mmol/L (ref 135–146)
Total Bilirubin: 0.5 mg/dL (ref 0.2–1.2)
Total Protein: 7.4 g/dL (ref 6.1–8.1)

## 2018-11-12 LAB — CBC WITH DIFFERENTIAL/PLATELET
Absolute Monocytes: 413 cells/uL (ref 200–950)
Basophils Absolute: 38 cells/uL (ref 0–200)
Basophils Relative: 0.8 %
Eosinophils Absolute: 221 cells/uL (ref 15–500)
Eosinophils Relative: 4.6 %
HCT: 42.2 % (ref 38.5–50.0)
Hemoglobin: 14 g/dL (ref 13.2–17.1)
Lymphs Abs: 1378 cells/uL (ref 850–3900)
MCH: 29.8 pg (ref 27.0–33.0)
MCHC: 33.2 g/dL (ref 32.0–36.0)
MCV: 89.8 fL (ref 80.0–100.0)
MPV: 10.2 fL (ref 7.5–12.5)
Monocytes Relative: 8.6 %
Neutro Abs: 2750 cells/uL (ref 1500–7800)
Neutrophils Relative %: 57.3 %
Platelets: 246 10*3/uL (ref 140–400)
RBC: 4.7 10*6/uL (ref 4.20–5.80)
RDW: 12.8 % (ref 11.0–15.0)
Total Lymphocyte: 28.7 %
WBC: 4.8 10*3/uL (ref 3.8–10.8)

## 2018-11-12 LAB — MAGNESIUM: Magnesium: 2.1 mg/dL (ref 1.5–2.5)

## 2018-11-12 LAB — HEMOGLOBIN A1C
Hgb A1c MFr Bld: 5.7 % of total Hgb — ABNORMAL HIGH (ref <5.7)
Mean Plasma Glucose: 117 (calc)
eAG (mmol/L): 6.5 (calc)

## 2018-11-12 LAB — TSH: TSH: 2.82 mIU/L (ref 0.40–4.50)

## 2018-11-12 LAB — VITAMIN B12: Vitamin B-12: 183 pg/mL — ABNORMAL LOW (ref 200–1100)

## 2018-11-12 LAB — LIPID PANEL
Cholesterol: 199 mg/dL (ref <200)
HDL: 47 mg/dL (ref >=40)
LDL Cholesterol (Calc): 125 mg/dL (calc) — ABNORMAL HIGH
Non-HDL Cholesterol (Calc): 152 mg/dL (calc) — ABNORMAL HIGH (ref <130)
Total CHOL/HDL Ratio: 4.2 (calc) (ref <5.0)
Triglycerides: 152 mg/dL — ABNORMAL HIGH (ref <150)

## 2018-11-13 MED ORDER — CYANOCOBALAMIN 1000 MCG/ML IJ SOLN: INTRAMUSCULAR | 2 refills | Status: DC

## 2018-11-13 MED ORDER — "BD SYRINGE/NEEDLE 25G X 5/8"" 1 ML MISC": 1 refills | Status: DC

## 2018-11-27 ENCOUNTER — Other Ambulatory Visit: Payer: Self-pay

## 2018-11-27 ENCOUNTER — Ambulatory Visit: Payer: Medicare Other

## 2018-11-27 NOTE — Progress Notes (Signed)
Will reschedule for another day due to not having picked up medication.

## 2018-11-28 ENCOUNTER — Ambulatory Visit (INDEPENDENT_AMBULATORY_CARE_PROVIDER_SITE_OTHER): Payer: Medicare Other

## 2018-11-28 ENCOUNTER — Other Ambulatory Visit: Payer: Self-pay

## 2018-11-28 DIAGNOSIS — E538 Deficiency of other specified B group vitamins: Secondary | ICD-10-CM | POA: Diagnosis not present

## 2018-11-28 MED ORDER — CYANOCOBALAMIN 1000 MCG/ML IJ SOLN
1000.0000 ug | Freq: Once | INTRAMUSCULAR | Status: AC
  Administered 2018-11-28: 11:00:00 1000 ug via INTRAMUSCULAR

## 2018-11-28 MED ORDER — "BD SAFETYGLIDE SHIELDED NEEDLE 25G X 1"" MISC": 0 refills | Status: DC

## 2018-11-28 MED ORDER — "BD SAFETYGLIDE SYRINGE/NEEDLE 25G X 1"" 3 ML MISC": 0 refills | Status: DC

## 2018-11-28 NOTE — Progress Notes (Signed)
Patient presents to the office with his wife for instruction on administration of B12 injections. Instructions and injection given to patient in left deltoid. Vitals taken and recorded.

## 2018-12-12 ENCOUNTER — Other Ambulatory Visit: Payer: Medicare Other

## 2018-12-26 ENCOUNTER — Other Ambulatory Visit: Payer: Self-pay

## 2018-12-26 ENCOUNTER — Other Ambulatory Visit: Payer: Medicare Other

## 2018-12-26 DIAGNOSIS — E538 Deficiency of other specified B group vitamins: Secondary | ICD-10-CM

## 2018-12-27 LAB — VITAMIN B12: Vitamin B-12: 620 pg/mL (ref 200–1100)

## 2019-01-26 ENCOUNTER — Other Ambulatory Visit: Payer: Self-pay | Admitting: Internal Medicine

## 2019-01-26 DIAGNOSIS — F3341 Major depressive disorder, recurrent, in partial remission: Secondary | ICD-10-CM

## 2019-02-12 NOTE — Progress Notes (Signed)
FOLLOW UP Assessment and Plan   Medication management -     Magnesium  Labile hypertension -     CBC with Differential/Platelet -     COMPLETE METABOLIC PANEL WITH GFR -     TSH - continue medications, DASH diet, exercise and monitor at home. Call if greater than 130/80.   Hyperlipidemia, mixed -     Lipid panel check lipids decrease fatty foods increase activity.   Abnormal glucose (prediabetes) -     Hemoglobin A1c Discussed disease progression and risks Discussed diet/exercise, weight management and risk modification  Vitamin D deficiency -     VITAMIN D 25 Hydroxy (Vit-D Deficiency, Fractures)  Recurrent major depressive disorder, in full remission (HCC) Increase exercise, can switch wellbutrin to effexor if he wants.   B12 deficiency Continue shots  Piriformis syndrome of right side Negative straight leg Information given, will call if any worsening symptoms or if not better Go to the ER if you have any new weakness in your legs, have trouble controlling your urine or bowels, or have worsening pain.   Over 30 minutes of exam, counseling, chart review, and critical decision making was performed  Future Appointments  Date Time Provider [place]  05/15/2019  9:00 AM Unk Pinto, MD [facility] None  08/18/2019  9:00 AM Liane Comber, NP [facility] None     Subjective:  Francisco Dixon is a 68 y.o. male who presents for 3 month follow up for HTN, hyperlipidemia, prediabetes, and vitamin D Def.   States for at least the last year after he is exercising/hiking, if he drives at all he gets a right buttocks pain that radiates down posterior thigh into his calf some, never reaches his foot. He has no pain with activity, no lower back pain. Better after he walks for a while.  No drop foot, no urinary issues.   His blood pressure has been controlled at home, today their BP is BP: 116/70 He does not workout. He denies chest pain, shortness of breath,  dizziness.   Doing well on wellbutrin but has been snappy, he may want to try effexor.    He has bilateral peripheral neuropathy, started on B12 shots for B12 deficiency, has been 2-3 months and has some improvement Lab Results  Component Value Date   VITAMINB12 620 12/26/2018   He is on cholesterol medication and denies myalgias. His cholesterol is at goal. The cholesterol last visit was:   Lab Results  Component Value Date   CHOL 169 02/13/2019   HDL 42 02/13/2019   LDLCALC 97 02/13/2019   TRIG 200 (H) 02/13/2019   CHOLHDL 4.0 02/13/2019   :  Lab Results  Component Value Date   HGBA1C 5.5 02/13/2019   Last GFR Lab Results  Component Value Date   GFRNONAA 69 02/13/2019   Patient is on Vitamin D supplement.   Lab Results  Component Value Date   VD25OH 99 02/13/2019     BMI is Body mass index is 24.56 kg/m., he is working on diet and exercise. Wt Readings from Last 3 Encounters:  02/13/19 156 lb 12.8 oz (71.1 kg)  11/11/18 159 lb 6.4 oz (72.3 kg)  10/09/18 159 lb 3.2 oz (72.2 kg)    Medication Review: Current Outpatient Medications on File Prior to Visit  Medication Sig Dispense Refill  . aspirin EC 81 MG tablet Take 81 mg by mouth daily.    Marland Kitchen buPROPion (WELLBUTRIN XL) 150 MG 24 hr tablet Take 1 tablet every  Morning for Mood, Focus & Concentration 90 tablet 3  . Cholecalciferol (VITAMIN D3) 5000 units CAPS Takes 2 caps (10,000 Units) daily    . cyanocobalamin (,VITAMIN B-12,) 1000 MCG/ML injection Inject 1029mcg once weekly for 8 weeks, then once a month 30 mL 2  . Flaxseed, Linseed, (FLAXSEED OIL) 1000 MG CAPS Take 2 capsules by mouth daily.    . meclizine (ANTIVERT) 25 MG tablet Take 1/2 to 1 tablet 2 to 3 x /day as needed for Dizziness / Vertigo 90 tablet 11  . meloxicam (MOBIC) 15 MG tablet Take 1/2 to 1 tablet daily with food for Pain & Inflammation - try limit to 5 days /week to avoid kidney damage 90 tablet 3  . NEEDLE, DISP, 25 G (BD SAFETYGLIDE SHIELDED  NEEDLE) 25G X 1" MISC Use for B12 injections 50 each 0  . Omega-3 Fatty Acids (FISH OIL) 1000 MG CAPS Takes 1 capsule daily  0  . rosuvastatin (CRESTOR) 40 MG tablet Take 1 tablet daily for Cholesterol 90 tablet 3  . SYRINGE-NEEDLE, DISP, 3 ML (BD SAFETYGLIDE SYRINGE/NEEDLE) 25G X 1" 3 ML MISC Use for B12 injections. 50 each 0  . SYRINGE/NEEDLE, DISP, 1 ML (B-D SYRINGE/NEEDLE 1CC/25GX5/8) 25G X 5/8" 1 ML MISC 1 SQ injection once weekly 100 each 1  . terbinafine (LAMISIL) 250 MG tablet TAKE 1 TABLET(250 MG) BY MOUTH DAILY 90 tablet 0   No current facility-administered medications on file prior to visit.     Allergies: Allergies  Allergen Reactions  . Other     Bee stings    Current Problems (verified) has Labile hypertension; Hyperlipidemia, mixed; Vitamin D deficiency; Medication management; Abnormal glucose (prediabetes); Allergy; Major depression in full remission ([place]); Numbness and tingling of both feet; Onychomycosis; and B12 deficiency on their problem list.  Surgical History: reviewed and unchanged Family History: reviewed and unchanged Social History: reviewed and unchanged   Objective:   Today's Vitals   02/13/19 1438  BP: 116/70  Pulse: 66  Temp: 97.7 F (36.5 C)  SpO2: 98%  Weight: 156 lb 12.8 oz (71.1 kg)  Height: 5\' 7"  (1.702 m)   Body mass index is 24.56 kg/m.  General appearance: alert, no distress, WD/WN, male HEENT: normocephalic, sclerae anicteric, TMs pearly, nares patent, no discharge or erythema, pharynx normal Oral cavity: MMM, no lesions Neck: supple, no lymphadenopathy, no thyromegaly, no masses Heart: RRR, normal S1, S2, no murmurs Lungs: CTA bilaterally, no wheezes, rhonchi, or rales Abdomen: +bs, soft, non tender, non distended, no masses, no hepatomegaly, no splenomegaly Musculoskeletal: nontender, no swelling, no obvious deformity, negative straight leg, slight SI tenderness, + pirformis tightness and IT pain.  Extremities: no edema, no  cyanosis, no clubbing Pulses: 2+ symmetric, upper and lower extremities, normal cap refill Neurological: alert, oriented x 3, CN2-12 intact, strength normal upper extremities and lower extremities, sensation normal throughout, DTRs 2+ throughout, no cerebellar signs, gait normal Psychiatric: normal affect, behavior normal, pleasant  Skin: left lateral foot with plantar wart   Vicie Mutters, PA-C   02/14/2019

## 2019-02-13 ENCOUNTER — Other Ambulatory Visit: Payer: Self-pay

## 2019-02-13 ENCOUNTER — Ambulatory Visit (INDEPENDENT_AMBULATORY_CARE_PROVIDER_SITE_OTHER): Payer: Medicare Other | Admitting: Physician Assistant

## 2019-02-13 ENCOUNTER — Encounter: Payer: Self-pay | Admitting: Physician Assistant

## 2019-02-13 VITALS — BP 116/70 | HR 66 | Temp 97.7°F | Ht 67.0 in | Wt 156.8 lb

## 2019-02-13 DIAGNOSIS — F3342 Major depressive disorder, recurrent, in full remission: Secondary | ICD-10-CM | POA: Diagnosis not present

## 2019-02-13 DIAGNOSIS — E559 Vitamin D deficiency, unspecified: Secondary | ICD-10-CM | POA: Diagnosis not present

## 2019-02-13 DIAGNOSIS — G5701 Lesion of sciatic nerve, right lower limb: Secondary | ICD-10-CM | POA: Diagnosis not present

## 2019-02-13 DIAGNOSIS — R7309 Other abnormal glucose: Secondary | ICD-10-CM | POA: Diagnosis not present

## 2019-02-13 DIAGNOSIS — E538 Deficiency of other specified B group vitamins: Secondary | ICD-10-CM | POA: Diagnosis not present

## 2019-02-13 DIAGNOSIS — R0989 Other specified symptoms and signs involving the circulatory and respiratory systems: Secondary | ICD-10-CM | POA: Diagnosis not present

## 2019-02-13 DIAGNOSIS — E782 Mixed hyperlipidemia: Secondary | ICD-10-CM | POA: Diagnosis not present

## 2019-02-13 DIAGNOSIS — Z79899 Other long term (current) drug therapy: Secondary | ICD-10-CM

## 2019-02-13 NOTE — Patient Instructions (Addendum)
Plantar Warts Warts are small growths on the skin. When they occur on the underside (sole) of the foot, they are called plantar warts. Plantar warts often occur in groups, with several small warts around a larger wart. They tend to develop on the heel or the ball of the foot. They may grow into the deeper layers of skin or rise above the surface of the skin. Most warts are not painful, and they usually do not cause problems. However, plantar warts may cause pain when you walk because pressure is applied to them. Plantar warts may spread to other areas of the sole. They can also spread to other areas of the body through direct and indirect contact. Warts often go away on their own in time. Various treatments may be done if needed or desired. What are the causes? Plantar warts are caused by a type of virus that is called human papillomavirus (HPV).  Walking barefoot can cause exposure to the virus, especially if your feet are wet.  HPV attacks a break in the skin of the foot. What increases the risk? You are more likely to develop this condition if you:  Are between 22-42 years of age.  Use public showers or locker rooms.  Have a weakened body defense system (immune system). What are the signs or symptoms? Common symptoms of this condition include:  Flat or slightly raised growths that have a rough surface and look similar to a callus.  Pain when you use your foot to support your body weight. How is this diagnosed? A plantar wart can usually be diagnosed from its appearance. In some cases, a tissue sample may be removed (biopsy) to be looked at under a microscope. How is this treated? In many cases, warts do not need treatment. Without treatment, they often go away with time. If treatment is needed or desired, options may include:  Applying medicated solutions, creams, or patches to the wart. These may be over-the-counter or prescription medicines that make the skin soft so that layers will  gradually shed away. In many cases, the medicine is applied one or two times a day and covered with a bandage.  Freezing the wart with liquid nitrogen (cryotherapy).  Burning the wart with: ? Laser treatment. ? An electrified probe (electrocautery).  Injecting a medicine (Candida antigen) into the wart to help the body's immune system fight off the wart.  Having surgery to remove the wart.  Putting duct tape over the top of the wart (occlusion). You will leave the tape in place for as long as told by your health care provider, and then you will replace it with a new strip of tape. This is done until the wart goes away. Repeat treatment may be needed if you choose to remove warts. Warts sometimes go away and come back again. Follow these instructions at home:  Apply medicated creams or solutions only as told by your health care provider. This may involve: ? Soaking the affected area in warm water. ? Removing the top layer of softened skin before you apply the medicine. A pumice stone works well for removing the skin. ? Applying a bandage over the affected area after you apply the medicine. ? Repeating the process daily or as told by your health care provider.  Do not scratch or pick at a wart.  Wash your hands after you touch a wart.  If a wart is painful, try covering it with a bandage that has a hole in the middle. This helps  to take pressure off the wart.  Keep all follow-up visits as told by your health care provider. This is important. How is this prevented? Take these actions to help prevent warts:  Wear shoes and socks. Change your socks daily.  Keep your feet clean and dry.  Do not walk barefoot in shared locker rooms, shower areas, or swimming pools.  Check your feet regularly.  Avoid direct contact with warts on other people. Contact a health care provider if:  Your warts do not improve after treatment.  You have redness, swelling, or pain at the site of a  wart.  You have bleeding from a wart that does not stop with light pressure.  You have diabetes and you develop a wart. Summary  Warts are small growths on the skin. When they occur on the underside (sole) of the foot, they are called plantar warts.  In many cases, warts do not need treatment. Without treatment, they often go away with time.  Apply medicated creams or solutions only as told by your health care provider.  Do not scratch or pick at a wart. Wash your hands after you touch a wart.  Keep all follow-up visits as told by your health care provider. This is important. This information is not intended to replace advice given to you by your health care provider. Make sure you discuss any questions you have with your health care provider. Document Released: 06/24/2003 Document Revised: 10/30/2017 Document Reviewed: 10/30/2017 Elsevier Patient Education  2020 [employer].   Piriformis Syndrome  Piriformis syndrome is a condition that can cause pain and numbness in your buttocks and down the back of your leg. Piriformis syndrome happens when the small muscle that connects the base of your spine to your hip (piriformis muscle) presses on the nerve that runs down the back of your leg (sciatic nerve). The piriformis muscle helps your hip rotate and helps to bring your leg back and out. It also helps shift your weight to keep you stable while you are walking. The sciatic nerve runs under or through the piriformis muscle. Damage to the piriformis muscle can cause spasms that put pressure on the nerve below. This causes pain and discomfort while sitting and moving. The pain may feel as if it begins in the buttock and spreads (radiates) down your hip and thigh. What are the causes? This condition is caused by pressure on the sciatic nerve from the piriformis muscle. The piriformis muscle can get irritated with overuse, especially if other hip muscles are weak and the piriformis muscle has to  do extra work. Piriformis syndrome can also occur after an injury, like a fall onto your buttocks. What increases the risk? You are more likely to develop this condition if you:  Are a woman.  Sit for long periods of time.  Are a cyclist.  Have weak buttocks muscles (gluteal muscles). What are the signs or symptoms? Symptoms of this condition include:  Pain, tingling, or numbness that starts in the buttock and runs down the back of your leg (sciatica).  Pain in the groin or thigh area. Your symptoms may get worse:  The longer you sit.  When you walk, run, or climb stairs.  When straining to have a bowel movement. How is this diagnosed? This condition is diagnosed based on your symptoms, medical history, and physical exam.  During the exam, your health care provider may: ? Move your leg into different positions to check for pain. ? Press on the muscles  of your hip and buttock to see if that increases your symptoms.  You may also have tests, including: ? Imaging tests such as X-rays, MRI, or ultrasound. ? Electromyogram (EMG). This test measures electrical signals sent by your nerves into the muscles. ? Nerve conduction study. This test measures how well electrical signals pass through your nerves. How is this treated? This condition may be treated by:  Stopping all activities that cause pain or make your condition worse.  Applying ice or using heat therapy.  Taking medicines to reduce pain and swelling.  Taking a muscle relaxer (muscle relaxant) to stop muscle spasms.  Doing range-of-motion and strengthening exercises (physical therapy) as told by your health care provider.  Massaging the area.  Having acupuncture.  Getting an injection of medicine in the piriformis muscle. Your health care provider will choose the medicine based on your condition. He or she may inject: ? An anti-inflammatory medicine (steroid) to reduce swelling. ? A numbing medicine (local  anesthetic) to block the pain. ? Botulinum toxin. The toxin blocks nerve impulses to specific muscles to reduce muscle tension. In rare cases, you may need surgery to cut the muscle and release pressure on the nerve if other treatments do not work. Follow these instructions at home: Activity  Do not sit for long periods. Get up and walk around every 20 minutes or as often as told by your health care provider. ? When driving long distances, make sure to take frequent stops to get up and stretch.  Use a cushion when you sit on hard surfaces.  Do exercises as told by your health care provider.  Return to your normal activities as told by your health care provider. Ask your health care provider what activities are safe for you. Managing pain, stiffness, and swelling      If directed, apply heat to the affected area as often as told by your health care provider. Use the heat source that your health care provider recommends, such as a moist heat pack or a heating pad. ? Place a towel between your skin and the heat source. ? Leave the heat on for 20-30 minutes. ? Remove the heat if your skin turns bright red. This is especially important if you are unable to feel pain, heat, or cold. You may have a greater risk of getting burned.  If directed, put ice on the injured area. ? Put ice in a plastic bag. ? Place a towel between your skin and the bag. ? Leave the ice on for 20 minutes, 2-3 times a day. General instructions  Take over-the-counter and prescription medicines only as told by your health care provider.  Ask your health care provider if the medicine prescribed to you requires you to avoid driving or using heavy machinery.  You may need to take actions to prevent or treat constipation, such as: ? Drink enough fluid to keep your urine pale yellow. ? Take over-the-counter or prescription medicines. ? Eat foods that are high in fiber, such as beans, whole grains, and fresh fruits and  vegetables. ? Limit foods that are high in fat and processed sugars, such as fried or sweet foods.  Keep all follow-up visits as told by your health care provider. This is important. How is this prevented?  Do not sit for longer than 20 minutes at a time. When you sit, choose padded surfaces.  Warm up and stretch before being active.  Cool down and stretch after being active.  Give your body  time to rest between periods of activity.  Make sure to use equipment that fits you.  Maintain physical fitness, including: ? Strength. ? Flexibility. Contact a health care provider if:  Your pain and stiffness continue or get worse.  Your leg or hip becomes weak.  You have changes in your bowel function or bladder function. Summary  Piriformis syndrome is a condition that can cause pain, tingling, and numbness in your buttocks and down the back of your leg.  You may try applying heat or ice to relieve the pain.  Do not sit for long periods. Get up and walk around every 20 minutes or as often as told by your health care provider. This information is not intended to replace advice given to you by your health care provider. Make sure you discuss any questions you have with your health care provider. Document Released: 04/03/2005 Document Revised: 07/25/2018 Document Reviewed: 11/28/2017 Elsevier Patient Education  2020 [employer].   Piriformis Syndrome Rehab Ask your health care provider which exercises are safe for you. Do exercises exactly as told by your health care provider and adjust them as directed. It is normal to feel mild stretching, pulling, tightness, or discomfort as you do these exercises. Stop right away if you feel sudden pain or your pain gets worse. Do not begin these exercises until told by your health care provider. Stretching and range-of-motion exercises These exercises warm up your muscles and joints and improve the movement and flexibility of your hip and pelvis.  The exercises also help to relieve pain, numbness, and tingling. Hip rotation This is an exercise in which you lie on your back and stretch the muscles that rotate your hip (hip rotators) to stretch your buttocks. 1. Lie on your back on a firm surface. 2. Pull your left / right knee toward your same shoulder with your left / right hand until your knee is pointing toward the ceiling. Hold your left / right ankle with your other hand. 3. Keeping your knee steady, gently pull your left / right ankle toward your other shoulder until you feel a stretch in your buttocks. 4. Hold this position for __________ seconds. Repeat __________ times. Complete this exercise __________ times a day. Hip extensor This is an exercise in which you lie on your back and pull your knee to your chest. 1. Lie on your back on a firm surface. Both of your legs should be straight. 2. Pull your left / right knee to your chest. Hold your leg in this position by holding onto the back of your thigh or the front of your knee. 3. Hold this position for __________ seconds. 4. Slowly return to the starting position. Repeat __________ times. Complete this exercise __________ times a day. Strengthening exercises These exercises build strength and endurance in your hip and thigh muscles. Endurance is the ability to use your muscles for a long time, even after they get tired. Straight leg raises, side-lying This exercise strengthens the muscles that rotate the leg at the hip and move it away from your body (hip abductors). 1. Lie on your side with your left / right leg in the top position. Lie so your head, shoulder, knee, and hip line up. Bend your bottom knee to help you balance. 2. Lift your top leg 4-6 inches (10-15 cm) while keeping your toes pointed straight ahead. 3. Hold this position for __________ seconds. 4. Slowly lower your leg to the starting position. 5. Let your muscles relax completely after  each repetition. Repeat  __________ times. Complete this exercise __________ times a day. Hip abduction and rotation This is sometimes called quadruped (on hands and knees) exercises. 1. Get on your hands and knees on a firm, lightly padded surface. Your hands should be directly below your shoulders, and your knees should be directly below your hips. 2. Lift your left / right knee out to the side. Keep your knee bent. Do not twist your body. 3. Hold this position for __________ seconds. 4. Slowly lower your leg. Repeat __________ times. Complete this exercise __________ times a day. Straight leg raises, face-down This exercise stretches the muscles that move your hips away from the front of the pelvis (hip extensors). 1. Lie on your abdomen on a bed or a firm surface with a pillow under your hips. 2. Squeeze your buttocks muscles and lift your left / right leg about 4-6 inches (10-15 cm) off the bed. Do not let your back arch. 3. Hold this position for __________ seconds. 4. Slowly lower your leg to the starting position. 5. Let your muscles relax completely after each repetition. Repeat __________ times. Complete this exercise __________ times a day. This information is not intended to replace advice given to you by your health care provider. Make sure you discuss any questions you have with your health care provider. Document Released: 04/03/2005 Document Revised: 07/25/2018 Document Reviewed: 01/24/2018 Elsevier Patient Education  2020 [employer].

## 2019-02-14 LAB — CBC WITH DIFFERENTIAL/PLATELET
Absolute Monocytes: 476 cells/uL (ref 200–950)
Basophils Absolute: 39 cells/uL (ref 0–200)
Basophils Relative: 0.7 %
Eosinophils Absolute: 112 cells/uL (ref 15–500)
Eosinophils Relative: 2 %
HCT: 41.4 % (ref 38.5–50.0)
Hemoglobin: 13.9 g/dL (ref 13.2–17.1)
Lymphs Abs: 1372 cells/uL (ref 850–3900)
MCH: 30.8 pg (ref 27.0–33.0)
MCHC: 33.6 g/dL (ref 32.0–36.0)
MCV: 91.6 fL (ref 80.0–100.0)
MPV: 10.3 fL (ref 7.5–12.5)
Monocytes Relative: 8.5 %
Neutro Abs: 3601 cells/uL (ref 1500–7800)
Neutrophils Relative %: 64.3 %
Platelets: 273 10*3/uL (ref 140–400)
RBC: 4.52 10*6/uL (ref 4.20–5.80)
RDW: 12.5 % (ref 11.0–15.0)
Total Lymphocyte: 24.5 %
WBC: 5.6 10*3/uL (ref 3.8–10.8)

## 2019-02-14 LAB — COMPLETE METABOLIC PANEL WITH GFR
AG Ratio: 1.7 (calc) (ref 1.0–2.5)
ALT: 18 U/L (ref 9–46)
AST: 19 U/L (ref 10–35)
Albumin: 4.4 g/dL (ref 3.6–5.1)
Alkaline phosphatase (APISO): 49 U/L (ref 35–144)
BUN: 17 mg/dL (ref 7–25)
CO2: 31 mmol/L (ref 20–32)
Calcium: 9.9 mg/dL (ref 8.6–10.3)
Chloride: 105 mmol/L (ref 98–110)
Creat: 1.09 mg/dL (ref 0.70–1.25)
GFR, Est African American: 80 mL/min/{1.73_m2} (ref >=60)
GFR, Est Non African American: 69 mL/min/{1.73_m2} (ref >=60)
Globulin: 2.6 g/dL (calc) (ref 1.9–3.7)
Glucose, Bld: 97 mg/dL (ref 65–99)
Potassium: 4.3 mmol/L (ref 3.5–5.3)
Sodium: 142 mmol/L (ref 135–146)
Total Bilirubin: 0.5 mg/dL (ref 0.2–1.2)
Total Protein: 7 g/dL (ref 6.1–8.1)

## 2019-02-14 LAB — LIPID PANEL
Cholesterol: 169 mg/dL (ref <200)
HDL: 42 mg/dL (ref >=40)
LDL Cholesterol (Calc): 97 mg/dL (calc)
Non-HDL Cholesterol (Calc): 127 mg/dL (calc) (ref <130)
Total CHOL/HDL Ratio: 4 (calc) (ref <5.0)
Triglycerides: 200 mg/dL — ABNORMAL HIGH (ref <150)

## 2019-02-14 LAB — MAGNESIUM: Magnesium: 2.1 mg/dL (ref 1.5–2.5)

## 2019-02-14 LAB — HEMOGLOBIN A1C
Hgb A1c MFr Bld: 5.5 % of total Hgb (ref <5.7)
Mean Plasma Glucose: 111 (calc)
eAG (mmol/L): 6.2 (calc)

## 2019-02-14 LAB — VITAMIN D 25 HYDROXY (VIT D DEFICIENCY, FRACTURES): Vit D, 25-Hydroxy: 99 ng/mL (ref 30–100)

## 2019-02-14 LAB — TSH: TSH: 1.75 mIU/L (ref 0.40–4.50)

## 2019-03-05 ENCOUNTER — Other Ambulatory Visit: Payer: Self-pay | Admitting: Physician Assistant

## 2019-03-05 MED ORDER — VENLAFAXINE HCL ER 37.5 MG PO CP24: 37.5000 mg | ORAL_CAPSULE | Freq: Every day | ORAL | 2 refills | Status: DC

## 2019-03-27 DIAGNOSIS — H11441 Conjunctival cysts, right eye: Secondary | ICD-10-CM | POA: Diagnosis not present

## 2019-05-14 ENCOUNTER — Encounter: Payer: Self-pay | Admitting: Internal Medicine

## 2019-05-14 NOTE — Progress Notes (Signed)
Annual  Screening/Preventative Visit  & Comprehensive Evaluation & Examination     This very nice 69 y.o. [name] presents for a  comprehensive evaluation and management of multiple medical co-morbidities.  Patient has been followed for HTN, HLD, Prediabetes and Vitamin D Deficiency.     Patient is followed expectantly for labile HTN. Patient's BP has been controlled at home.  Today's BP is at goal - 110/70. Patient denies any cardiac symptoms as chest pain, palpitations, shortness of breath, dizziness or ankle swelling.     Patient's hyperlipidemia is controlled with diet and medications. Patient denies myalgias or other medication SE's. Last lipids were at goal except elevated Trig's:  Lab Results  Component Value Date   CHOL 169 02/13/2019   HDL 42 02/13/2019   LDLCALC 97 02/13/2019   TRIG 200 (H) 02/13/2019   CHOLHDL 4.0 02/13/2019       Patient is monitored for abnormal glucose / glucose intolerance and patient denies reactive hypoglycemic symptoms, visual blurring, diabetic polys or paresthesias. Last A1c was Normal & at goal:  Lab Results  Component Value Date   HGBA1C 5.5 02/13/2019        Finally, patient has history of Vitamin D Deficiency ("29" / 2017) and last vitamin D was at goal:  Lab Results  Component Value Date   VD25OH 99 02/13/2019    Current Outpatient Medications on File Prior to Visit  Medication Sig  . aspirin EC 81 MG tablet Take 81 mg by mouth daily.  . Cholecalciferol (VITAMIN D3) 5000 units CAPS Takes 2 caps (10,000 Units) daily  . cyanocobalamin (,VITAMIN B-12,) 1000 MCG/ML injection Inject 1024mcg once weekly for 8 weeks, then once a month  . Flaxseed, Linseed, (FLAXSEED OIL) 1000 MG CAPS Take 2 capsules by mouth daily.  . meclizine (ANTIVERT) 25 MG tablet Take 1/2 to 1 tablet 2 to 3 x /day as needed for Dizziness / Vertigo  . meloxicam (MOBIC) 15 MG tablet Take 1/2 to 1 tablet daily with food for Pain & Inflammation - try limit to 5 days /week to  avoid kidney damage  . NEEDLE, DISP, 25 G (BD SAFETYGLIDE SHIELDED NEEDLE) 25G X 1" MISC Use for B12 injections  . Omega-3 Fatty Acids (FISH OIL) 1000 MG CAPS Takes 1 capsule daily  . rosuvastatin (CRESTOR) 40 MG tablet Take 1 tablet daily for Cholesterol  . SYRINGE-NEEDLE, DISP, 3 ML (BD SAFETYGLIDE SYRINGE/NEEDLE) 25G X 1" 3 ML MISC Use for B12 injections.  . SYRINGE/NEEDLE, DISP, 1 ML (B-D SYRINGE/NEEDLE 1CC/25GX5/8) 25G X 5/8" 1 ML MISC 1 SQ injection once weekly  . terbinafine (LAMISIL) 250 MG tablet TAKE 1 TABLET(250 MG) BY MOUTH DAILY  . venlafaxine XR (EFFEXOR XR) 37.5 MG 24 hr capsule Take 1 capsule (37.5 mg total) by mouth daily.   No current facility-administered medications on file prior to visit.   Allergies  Allergen Reactions  . Other     Bee stings   Past Medical History:  Diagnosis Date  . GERD (gastroesophageal reflux disease)   . Hypercholesterolemia   . Hypertension    labile   . Plantar fasciitis, right 12/07/2016  . Vitamin D deficiency    Health Maintenance  Topic Date Due  . PNA vac Low Risk Adult (2 of 2 - PPSV23) 04/30/2019  . Hepatitis C Screening  08/05/2019 (Originally Jul 09, 1950)  . COLONOSCOPY  11/15/2025  . TETANUS/TDAP  01/04/2027  . INFLUENZA VACCINE  Discontinued   Immunization History  Administered Date(s) Administered  . Pneumococcal  Conjugate-13 04/29/2018  . Td 01/03/2017   Last Colon - 11/16/2015 in [place] & was recommended 10 yr f/u due Aug 2027  Past Surgical History:  Procedure Laterality Date  . COLONOSCOPY N/A 2002,2007,2012  . ROTATOR CUFF REPAIR Right 2015    Social History   Socioeconomic History  . Marital status: Married    Spouse name: Neoma Laming  . Number of children: 2 children  Occupational History  . Retired [employer]  Tobacco Use  . Smoking status: Never Smoker  . Smokeless tobacco: Never Used  Substance and Sexual Activity  . Alcohol use: Yes    Comment: drinks 2-3 beers a week  . Drug use: No    . Sexual activity: Not on file    ROS Constitutional: Denies fever, chills, weight loss/gain, headaches, insomnia,  night sweats or change in appetite. Does c/o fatigue. Eyes: Denies redness, blurred vision, diplopia, discharge, itchy or watery eyes.  ENT: Denies discharge, congestion, post nasal drip, epistaxis, sore throat, earache, hearing loss, dental pain, Tinnitus, Vertigo, Sinus pain or snoring.  Cardio: Denies chest pain, palpitations, irregular heartbeat, syncope, dyspnea, diaphoresis, orthopnea, PND, claudication or edema Respiratory: denies cough, dyspnea, DOE, pleurisy, hoarseness, laryngitis or wheezing.  Gastrointestinal: Denies dysphagia, heartburn, reflux, water brash, pain, cramps, nausea, vomiting, bloating, diarrhea, constipation, hematemesis, melena, hematochezia, jaundice or hemorrhoids Genitourinary: Denies dysuria, frequency, urgency, nocturia, hesitancy, discharge, hematuria or flank pain Musculoskeletal: Denies arthralgia, myalgia, stiffness, Jt. Swelling, pain, limp or strain/sprain. Denies Falls. Skin: Denies puritis, rash, hives, warts, acne, eczema or change in skin lesion Neuro: No weakness, tremor, incoordination, spasms, paresthesia or pain Psychiatric: Denies confusion, memory loss or sensory loss. Denies Depression. Endocrine: Denies change in weight, skin, hair change, nocturia, and paresthesia, diabetic polys, visual blurring or hyper / hypo glycemic episodes.  Heme/Lymph: No excessive bleeding, bruising or enlarged lymph nodes.  Physical Exam  BP 110/70   Pulse 64   Temp (!) 97 F (36.1 C)   Resp 16   Ht 5\' 7"  (1.702 m)   Wt 159 lb 12.8 oz (72.5 kg)   BMI 25.03 kg/m   General Appearance: Well nourished and well groomed and in no apparent distress.  Eyes: PERRLA, EOMs, conjunctiva no swelling or erythema, normal fundi and vessels. Sinuses: No frontal/maxillary tenderness ENT/Mouth: EACs patent / TMs  nl. Nares clear without erythema, swelling,  mucoid exudates. Oral hygiene is good. No erythema, swelling, or exudate. Tongue normal, non-obstructing. Tonsils not swollen or erythematous. Hearing normal.  Neck: Supple, thyroid not palpable. No bruits, nodes or JVD. Respiratory: Respiratory effort normal.  BS equal and clear bilateral without rales, rhonci, wheezing or stridor. Cardio: Heart sounds are normal with regular rate and rhythm and no murmurs, rubs or gallops. Peripheral pulses are normal and equal bilaterally without edema. No aortic or femoral bruits. Chest: symmetric with normal excursions and percussion.  Abdomen: Soft, with Nl bowel sounds. Nontender, no guarding, rebound, hernias, masses, or organomegaly.  Lymphatics: Non tender without lymphadenopathy.  Musculoskeletal: Full ROM all peripheral extremities, joint stability, 5/5 strength, and normal gait. Skin: Warm and dry without rashes, lesions, cyanosis, clubbing or  ecchymosis.  Neuro: Cranial nerves intact, reflexes equal bilaterally. Normal muscle tone, no cerebellar symptoms. Sensation intact.  Pysch: Alert and oriented X 3 with normal affect, insight and judgment appropriate.   Assessment and Plan  1. Labile hypertension  - EKG 12-Lead - [place], RETROPERITNL ABD,  LTD - Urinalysis, Routine w reflex microscopic - Microalbumin / creatinine urine ratio - CBC  with Differential/Platelet - COMPLETE METABOLIC PANEL WITH GFR - Magnesium - TSH  2. Hyperlipidemia, mixed  - EKG 12-Lead - [place], RETROPERITNL ABD,  LTD - Lipid panel - TSH  3. Abnormal glucose  - EKG 12-Lead - [place], RETROPERITNL ABD,  LTD - Hemoglobin A1c - Insulin, random  4. Vitamin D deficiency  - VITAMIN D 25 Hydroxy  5. Screening for colorectal cancer  - POC Hemoccult Bld/Stl  6. BPH with obstruction/lower urinary tract symptoms  - PSA  7. Prostate cancer screening  - PSA  8. Screening for ischemic heart disease  - EKG 12-Lead  9. FH: hypertension  - EKG 12-Lead - [place],  RETROPERITNL ABD,  LTD  10. Screening for AAA (aortic abdominal aneurysm)  - [place], RETROPERITNL ABD,  LTD  11. Medication management - Urinalysis, Routine w reflex microscopic - Microalbumin / creatinine urine ratio - CBC with Differential/Platelet - COMPLETE METABOLIC PANEL WITH GFR - Magnesium - Lipid panel - TSH - Hemoglobin A1c - Insulin, random - VITAMIN D 25 Hydroxy          Patient was counseled in prudent diet, weight control to achieve/maintain BMI less than 25, BP monitoring, regular exercise and medications as discussed.  Discussed med effects and SE's. Routine screening labs and tests as requested with regular follow-up as recommended. Over 40 minutes of exam, counseling, chart review and high complex critical decision making was performed   Kirtland Bouchard, MD

## 2019-05-14 NOTE — Patient Instructions (Signed)

## 2019-05-15 ENCOUNTER — Ambulatory Visit (INDEPENDENT_AMBULATORY_CARE_PROVIDER_SITE_OTHER): Payer: Medicare Other | Admitting: Internal Medicine

## 2019-05-15 ENCOUNTER — Other Ambulatory Visit: Payer: Self-pay

## 2019-05-15 VITALS — BP 110/70 | HR 64 | Temp 97.0°F | Resp 16 | Ht 67.0 in | Wt 159.8 lb

## 2019-05-15 DIAGNOSIS — R0989 Other specified symptoms and signs involving the circulatory and respiratory systems: Secondary | ICD-10-CM

## 2019-05-15 DIAGNOSIS — N138 Other obstructive and reflux uropathy: Secondary | ICD-10-CM | POA: Diagnosis not present

## 2019-05-15 DIAGNOSIS — Z8249 Family history of ischemic heart disease and other diseases of the circulatory system: Secondary | ICD-10-CM | POA: Diagnosis not present

## 2019-05-15 DIAGNOSIS — Z136 Encounter for screening for cardiovascular disorders: Secondary | ICD-10-CM | POA: Diagnosis not present

## 2019-05-15 DIAGNOSIS — Z1212 Encounter for screening for malignant neoplasm of rectum: Secondary | ICD-10-CM

## 2019-05-15 DIAGNOSIS — E782 Mixed hyperlipidemia: Secondary | ICD-10-CM | POA: Diagnosis not present

## 2019-05-15 DIAGNOSIS — Z79899 Other long term (current) drug therapy: Secondary | ICD-10-CM | POA: Diagnosis not present

## 2019-05-15 DIAGNOSIS — Z125 Encounter for screening for malignant neoplasm of prostate: Secondary | ICD-10-CM | POA: Diagnosis not present

## 2019-05-15 DIAGNOSIS — Z1211 Encounter for screening for malignant neoplasm of colon: Secondary | ICD-10-CM

## 2019-05-15 DIAGNOSIS — R7309 Other abnormal glucose: Secondary | ICD-10-CM

## 2019-05-15 DIAGNOSIS — F332 Major depressive disorder, recurrent severe without psychotic features: Secondary | ICD-10-CM

## 2019-05-15 DIAGNOSIS — E559 Vitamin D deficiency, unspecified: Secondary | ICD-10-CM | POA: Diagnosis not present

## 2019-05-15 DIAGNOSIS — N401 Enlarged prostate with lower urinary tract symptoms: Secondary | ICD-10-CM | POA: Diagnosis not present

## 2019-05-15 MED ORDER — VENLAFAXINE HCL ER 75 MG PO CP24: ORAL_CAPSULE | ORAL | 3 refills | Status: DC

## 2019-05-16 ENCOUNTER — Ambulatory Visit: Payer: Medicare Other

## 2019-05-16 LAB — COMPLETE METABOLIC PANEL WITH GFR
AG Ratio: 1.8 (calc) (ref 1.0–2.5)
ALT: 19 U/L (ref 9–46)
AST: 17 U/L (ref 10–35)
Albumin: 4.6 g/dL (ref 3.6–5.1)
Alkaline phosphatase (APISO): 46 U/L (ref 35–144)
BUN: 19 mg/dL (ref 7–25)
CO2: 31 mmol/L (ref 20–32)
Calcium: 10.1 mg/dL (ref 8.6–10.3)
Chloride: 103 mmol/L (ref 98–110)
Creat: 0.97 mg/dL (ref 0.70–1.25)
GFR, Est African American: 93 mL/min/{1.73_m2} (ref >=60)
GFR, Est Non African American: 80 mL/min/{1.73_m2} (ref >=60)
Globulin: 2.5 g/dL (calc) (ref 1.9–3.7)
Glucose, Bld: 68 mg/dL (ref 65–99)
Potassium: 4.5 mmol/L (ref 3.5–5.3)
Sodium: 141 mmol/L (ref 135–146)
Total Bilirubin: 0.6 mg/dL (ref 0.2–1.2)
Total Protein: 7.1 g/dL (ref 6.1–8.1)

## 2019-05-16 LAB — CBC WITH DIFFERENTIAL/PLATELET
Absolute Monocytes: 500 cells/uL (ref 200–950)
Basophils Absolute: 50 cells/uL (ref 0–200)
Basophils Relative: 1 %
Eosinophils Absolute: 100 cells/uL (ref 15–500)
Eosinophils Relative: 2 %
HCT: 43 % (ref 38.5–50.0)
Hemoglobin: 14.4 g/dL (ref 13.2–17.1)
Lymphs Abs: 1220 cells/uL (ref 850–3900)
MCH: 29.8 pg (ref 27.0–33.0)
MCHC: 33.5 g/dL (ref 32.0–36.0)
MCV: 88.8 fL (ref 80.0–100.0)
MPV: 9.9 fL (ref 7.5–12.5)
Monocytes Relative: 10 %
Neutro Abs: 3130 cells/uL (ref 1500–7800)
Neutrophils Relative %: 62.6 %
Platelets: 289 10*3/uL (ref 140–400)
RBC: 4.84 10*6/uL (ref 4.20–5.80)
RDW: 12.3 % (ref 11.0–15.0)
Total Lymphocyte: 24.4 %
WBC: 5 10*3/uL (ref 3.8–10.8)

## 2019-05-16 LAB — PSA: PSA: 0.3 ng/mL

## 2019-05-16 LAB — URINALYSIS, ROUTINE W REFLEX MICROSCOPIC
Bilirubin Urine: NEGATIVE
Glucose, UA: NEGATIVE
Hgb urine dipstick: NEGATIVE
Ketones, ur: NEGATIVE
Leukocytes,Ua: NEGATIVE
Nitrite: NEGATIVE
Protein, ur: NEGATIVE
Specific Gravity, Urine: 1.013 (ref 1.001–1.03)
pH: 6.5 (ref 5.0–8.0)

## 2019-05-16 LAB — MICROALBUMIN / CREATININE URINE RATIO
Creatinine, Urine: 70 mg/dL (ref 20–320)
Microalb Creat Ratio: 3 mcg/mg creat (ref <30)
Microalb, Ur: 0.2 mg/dL

## 2019-05-16 LAB — HEMOGLOBIN A1C
Hgb A1c MFr Bld: 5.7 % of total Hgb — ABNORMAL HIGH (ref <5.7)
Mean Plasma Glucose: 117 (calc)
eAG (mmol/L): 6.5 (calc)

## 2019-05-16 LAB — LIPID PANEL
Cholesterol: 184 mg/dL
HDL: 49 mg/dL
LDL Cholesterol (Calc): 105 mg/dL — ABNORMAL HIGH
Non-HDL Cholesterol (Calc): 135 mg/dL — ABNORMAL HIGH
Total CHOL/HDL Ratio: 3.8 (calc)
Triglycerides: 178 mg/dL — ABNORMAL HIGH

## 2019-05-16 LAB — TSH: TSH: 2.41 mIU/L (ref 0.40–4.50)

## 2019-05-16 LAB — VITAMIN D 25 HYDROXY (VIT D DEFICIENCY, FRACTURES): Vit D, 25-Hydroxy: 89 ng/mL (ref 30–100)

## 2019-05-16 LAB — MAGNESIUM: Magnesium: 2.2 mg/dL (ref 1.5–2.5)

## 2019-05-16 LAB — INSULIN, RANDOM: Insulin: 5.8 u[IU]/mL

## 2019-05-17 ENCOUNTER — Encounter: Payer: Self-pay | Admitting: Internal Medicine

## 2019-05-17 MED ORDER — ROSUVASTATIN CALCIUM 20 MG PO TABS: ORAL_TABLET | ORAL | 3 refills | Status: DC

## 2019-05-24 ENCOUNTER — Ambulatory Visit: Payer: Medicare Other | Attending: Internal Medicine

## 2019-05-24 DIAGNOSIS — Z23 Encounter for immunization: Secondary | ICD-10-CM | POA: Insufficient documentation

## 2019-05-24 NOTE — Progress Notes (Signed)
   Covid-19 Vaccination Clinic  Name:  GOLAN SEGO    MRN: [id number] DOB: 1950/08/25  05/24/2019  Mr. Nitcher was observed post Covid-19 immunization for 15 minutes without incidence. He was provided with Vaccine Information Sheet and instruction to access the V-Safe system.   Mr. Kokoszka was instructed to call 911 with any severe reactions post vaccine: Marland Kitchen Difficulty breathing  . Swelling of your face and throat  . A fast heartbeat  . A bad rash all over your body  . Dizziness and weakness    Immunizations Administered    Name Date Dose VIS Date Route   Pfizer COVID-19 Vaccine 05/24/2019  4:20 PM 0.3 mL 03/28/2019 Intramuscular   Manufacturer: [place]   Lot: [id number]   [place]: [id number]

## 2019-06-19 ENCOUNTER — Ambulatory Visit: Payer: Medicare Other | Attending: Internal Medicine

## 2019-06-19 DIAGNOSIS — Z23 Encounter for immunization: Secondary | ICD-10-CM

## 2019-06-19 NOTE — Progress Notes (Signed)
   Covid-19 Vaccination Clinic  Name:  HENG GOLDNER    MRN: [id number] DOB: Nov 13, 1950  06/19/2019  Mr. Schmeichel was observed post Covid-19 immunization for 15 minutes without incident. He was provided with Vaccine Information Sheet and instruction to access the V-Safe system.   Mr. Alsept was instructed to call 911 with any severe reactions post vaccine: Marland Kitchen Difficulty breathing  . Swelling of face and throat  . A fast heartbeat  . A bad rash all over body  . Dizziness and weakness   Immunizations Administered    Name Date Dose VIS Date Route   Pfizer COVID-19 Vaccine 06/19/2019 12:25 PM 0.3 mL 03/28/2019 Intramuscular   Manufacturer: [place]   Lot: [id number]   [place]: [id number]

## 2019-06-23 ENCOUNTER — Other Ambulatory Visit: Payer: Self-pay

## 2019-06-23 DIAGNOSIS — Z1211 Encounter for screening for malignant neoplasm of colon: Secondary | ICD-10-CM

## 2019-06-23 LAB — POC HEMOCCULT BLD/STL (HOME/3-CARD/SCREEN)
Card #2 Fecal Occult Blod, POC: NEGATIVE
Card #3 Fecal Occult Blood, POC: NEGATIVE
Fecal Occult Blood, POC: NEGATIVE

## 2019-06-24 DIAGNOSIS — Z1212 Encounter for screening for malignant neoplasm of rectum: Secondary | ICD-10-CM | POA: Diagnosis not present

## 2019-06-24 DIAGNOSIS — Z1211 Encounter for screening for malignant neoplasm of colon: Secondary | ICD-10-CM | POA: Diagnosis not present

## 2019-07-04 ENCOUNTER — Other Ambulatory Visit: Payer: Self-pay | Admitting: *Deleted

## 2019-07-04 DIAGNOSIS — E782 Mixed hyperlipidemia: Secondary | ICD-10-CM

## 2019-07-04 MED ORDER — ROSUVASTATIN CALCIUM 20 MG PO TABS: ORAL_TABLET | ORAL | 3 refills | Status: DC

## 2019-07-16 ENCOUNTER — Other Ambulatory Visit: Payer: Self-pay | Admitting: Internal Medicine

## 2019-07-16 DIAGNOSIS — E782 Mixed hyperlipidemia: Secondary | ICD-10-CM

## 2019-07-16 MED ORDER — ROSUVASTATIN CALCIUM 20 MG PO TABS: ORAL_TABLET | ORAL | 3 refills | Status: DC

## 2019-08-14 NOTE — Progress Notes (Signed)
MEDICARE ANNUAL WELLNESS VISIT AND FOLLOW UP Assessment:   Diagnoses and all orders for this visit:  Encounter for Medicare annual wellness exam  Labile hypertension Currently at goal without medications Monitor blood pressure at home; call if consistently over 130/80 Continue DASH diet.   Reminder to go to the ER if any CP, SOB, nausea, dizziness, severe HA, changes vision/speech, left arm numbness and tingling and jaw pain.  Vitamin D deficiency At goal at recent check; continue to recommend supplementation for goal of 70-100 Defer vitamin D level  Mixed hyperlipidemia Continue medications Continue low cholesterol diet and exercise.  Check lipid panel.   Other abnormal glucose (prediabetes) Discussed disease and risks Discussed diet/exercise, weight management  Monitor A1C q8m  Allergic state, sequela Has epi pen for bee stings; reminded to follow up at ER   BMI 24, adult Continue to recommend diet heavy in fruits and veggies and low in animal meats, cheeses, and dairy products, appropriate calorie intake Discuss exercise recommendations routinely Continue to monitor weight at each visit  Medication management Check CBC, CMP/GFR  B12 deficiency Continue monthly injections   Numbness/tingling bilateral lower extremities Mild; patient declines meds or further workup  Major depression in remission ([name]) In remission on medication  Lifestyle discussed: diet/exerise, sleep hygiene, stress management, hydration  Need for hepatitis C screening, born prior to 1965 never screened - Hep C antigen  Need for 23-polyvalent pneumococcal vaccination - 23 valent administered without complication today   Over 30 minutes of exam, counseling, chart review, and critical decision making was performed  Future Appointments  Date Time Provider [place]  12/02/2019  9:30 AM Unk Pinto, MD [facility] None  05/31/2020  9:00 AM Unk Pinto, MD [facility] None      Plan:   During the course of the visit the patient was educated and counseled about appropriate screening and preventive services including:    Pneumococcal vaccine   Influenza vaccine  Prevnar 13  Td vaccine  Screening electrocardiogram  Colorectal cancer screening  Diabetes screening  Glaucoma screening  Nutrition counseling    Subjective:  Francisco Dixon is a 69 y.o. male who presents for Medicare Annual Wellness Visit and 3 month follow up for HTN, hyperlipidemia, prediabetes, and vitamin D Def.   he has a diagnosis of depression and is currently on effexor 75 mg daily, reports symptoms are well controlled on current regimen.   He has persistent numbness/tingling of bilateral feet, not significantly limiting him. Questionably has been attributed to DDD seen on xray but he declines interventions or work up, managing with exercise and stretching, occasional NSAID.   BMI is Body mass index is 24.9 kg/m., he has been working on diet and exercise, very active building deck, installing kitchen cabinets, landscaping, etc, minimal sitting during the day.  Wt Readings from Last 3 Encounters:  08/18/19 159 lb (72.1 kg)  05/15/19 159 lb 12.8 oz (72.5 kg)  02/13/19 156 lb 12.8 oz (71.1 kg)   His blood pressure has been controlled at home, today their BP is BP: 132/72 He does workout. He denies chest pain, shortness of breath, dizziness.   He is on cholesterol medication (crestor 20 mg every other day) and denies myalgias. His cholesterol is not at goal. The cholesterol last visit was:   Lab Results  Component Value Date   CHOL 184 05/15/2019   HDL 49 05/15/2019   LDLCALC 105 (H) 05/15/2019   TRIG 178 (H) 05/15/2019   CHOLHDL 3.8 05/15/2019   He  has been working on diet and exercise for prediabetes, and denies foot ulcerations, increased appetite, nausea, paresthesia of the feet, polydipsia, polyuria, visual disturbances, vomiting and weight loss. Last A1C in the  office was:  Lab Results  Component Value Date   HGBA1C 5.7 (H) 05/15/2019   Last GFR Lab Results  Component Value Date   GFRNONAA 80 05/15/2019    Patient is on Vitamin D supplement and at goal at recent check:   Lab Results  Component Value Date   VD25OH 89 05/15/2019     He is on monthly B12 injections due to history of deficiency.  Lab Results  Component Value Date   [id number] 12/26/2018     Medication Review: Current Outpatient Medications on File Prior to Visit  Medication Sig Dispense Refill  . aspirin EC 81 MG tablet Take 81 mg by mouth daily.    . Cholecalciferol (VITAMIN D3) 5000 units CAPS Takes 2 caps (10,000 Units) daily    . cyanocobalamin (,VITAMIN B-12,) 1000 MCG/ML injection Inject 1043mcg once weekly for 8 weeks, then once a month 30 mL 2  . Flaxseed, Linseed, (FLAXSEED OIL) 1000 MG CAPS Take 2 capsules by mouth daily.    . meclizine (ANTIVERT) 25 MG tablet Take 1/2 to 1 tablet 2 to 3 x /day as needed for Dizziness / Vertigo 90 tablet 11  . meloxicam (MOBIC) 15 MG tablet Take 1/2 to 1 tablet daily with food for Pain & Inflammation - try limit to 5 days /week to avoid kidney damage 90 tablet 3  . NEEDLE, DISP, 25 G (BD SAFETYGLIDE SHIELDED NEEDLE) 25G X 1" MISC Use for B12 injections 50 each 0  . Omega-3 Fatty Acids (FISH OIL) 1000 MG CAPS Takes 1 capsule daily  0  . rosuvastatin (CRESTOR) 20 MG tablet Take 1 tablet every other day for Cholesterol 45 tablet 3  . SYRINGE-NEEDLE, DISP, 3 ML (BD SAFETYGLIDE SYRINGE/NEEDLE) 25G X 1" 3 ML MISC Use for B12 injections. 50 each 0  . SYRINGE/NEEDLE, DISP, 1 ML (B-D SYRINGE/NEEDLE 1CC/25GX5/8) 25G X 5/8" 1 ML MISC 1 SQ injection once weekly 100 each 1  . venlafaxine XR (EFFEXOR XR) 75 MG 24 hr capsule Take 1 capsule Daily for Mood 90 capsule 3   No current facility-administered medications on file prior to visit.    Allergies: Allergies  Allergen Reactions  . Other     Bee stings    Current Problems  (verified) has Labile hypertension; Hyperlipidemia, mixed; Vitamin D deficiency; Medication management; Abnormal glucose; Allergy; Major depression in full remission ([name]); Numbness and tingling of both feet; Onychomycosis; and B12 deficiency on their problem list.  Screening Tests Immunization History  Administered Date(s) Administered  . PFIZER SARS-COV-2 Vaccination 05/24/2019, 06/19/2019  . Pneumococcal Conjugate-13 04/29/2018  . Td 01/03/2017   Preventative care: Last colonoscopy:  August 2017, 10 year  Prior vaccinations: TD or Tdap: 01/2017  Influenza: declines Pneumococcal: DUE today  Prevnar13: 04/2018 Shingles/Zostavax: declines Covid 19: 2/2, 2021  Names of Other Physician/Practitioners you currently use: 1. [facility] here for primary care 2. Dr. ?, eye doctor, last visit 2020, wears glasses 3.  Dr. Marland Kitchen dentist, last visit 2021, goes q52m  Patient Care Team: Unk Pinto, MD as PCP - General (Internal Medicine)  Surgical: He  has a past surgical history that includes Colonoscopy (N/A, 2002,2007,2012) and Rotator cuff repair (Right, 2015). Family His family history is not on file. Social history  He reports that he has  never smoked. He has never used smokeless tobacco. He reports current alcohol use. He reports that he does not use drugs.  MEDICARE WELLNESS OBJECTIVES: Physical activity: Current Exercise Habits: The patient has a physically strenuous job, but has no regular exercise apart from work., Exercise limited by: None identified Cardiac risk factors: Cardiac Risk Factors include: advanced age (>62men, >27 women);male gender;hypertension;dyslipidemia Depression/mood screen:   Depression screen [facility] 2/9 08/18/2019  Decreased Interest 0  Down, Depressed, Hopeless 0  PHQ - 2 Score 0  Altered sleeping 0  Tired, decreased energy 0  Change in appetite 0  Feeling bad or failure about yourself  0  Trouble concentrating 0   Moving slowly or fidgety/restless 0  Suicidal thoughts 0  PHQ-9 Score 0  Difficult doing work/chores Not difficult at all    ADLs:  In your present state of health, do you have any difficulty performing the following activities: 08/18/2019 05/14/2019  Hearing? N N  Comment - -  Vision? N N  Difficulty concentrating or making decisions? N N  Walking or climbing stairs? N N  Dressing or bathing? N N  Doing errands, shopping? N N  Some recent data might be hidden     Cognitive Testing  Alert? Yes  Normal Appearance?Yes  Oriented to person? Yes  Place? Yes   Time? Yes  Recall of three objects?  Yes  Can perform simple calculations? Yes  Displays appropriate judgment?Yes  Can read the correct time from a watch face?Yes  EOL planning: Does Patient Have a Medical Advance Directive?: Yes Type of Advance Directive: Healthcare Power of Attorney, Living will Does patient want to make changes to medical advance directive?: No - Patient declined Copy of [place] in Chart?: No - copy requested   Objective:   Today's Vitals   08/18/19 0839  BP: 132/72  Pulse: 68  Resp: 16  Temp: 97.6 F (36.4 C)  Weight: 159 lb (72.1 kg)  Height: 5\' 7"  (1.702 m)   Body mass index is 24.9 kg/m.  General appearance: alert, no distress, WD/WN, male HEENT: normocephalic, sclerae anicteric, TMs pearly, nares patent, no discharge or erythema, pharynx normal. HOH with bilateral hearing aids.  Oral cavity: MMM, no lesions Neck: supple, no lymphadenopathy, no thyromegaly, no masses Heart: RRR, normal S1, S2, no murmurs Lungs: CTA bilaterally, no wheezes, rhonchi, or rales Abdomen: +bs, soft, non tender, non distended, no masses, no hepatomegaly, no splenomegaly Musculoskeletal: nontender, no swelling, no obvious deformity Extremities: no edema, no cyanosis, no clubbing Pulses: 2+ symmetric, upper and lower extremities, normal cap refill Neurological: alert, oriented x 3, CN2-12  intact, strength normal upper extremities and lower extremities, sensation normal throughout, DTRs 2+ throughout, no cerebellar signs, gait normal Psychiatric: normal affect, behavior normal, pleasant   Medicare Attestation I have personally reviewed: The patient's medical and social history Their use of alcohol, tobacco or illicit drugs Their current medications and supplements The patient's functional ability including ADLs,fall risks, home safety risks, cognitive, and hearing and visual impairment Diet and physical activities Evidence for depression or mood disorders  The patient's weight, height, BMI, and visual acuity have been recorded in the chart.  I have made referrals, counseling, and provided education to the patient based on review of the above and I have provided the patient with a written personalized care plan for preventive services.     Izora Ribas, NP   08/18/2019

## 2019-08-18 ENCOUNTER — Encounter: Payer: Self-pay | Admitting: Adult Health

## 2019-08-18 ENCOUNTER — Other Ambulatory Visit: Payer: Self-pay

## 2019-08-18 ENCOUNTER — Ambulatory Visit (INDEPENDENT_AMBULATORY_CARE_PROVIDER_SITE_OTHER): Payer: Medicare Other | Admitting: Adult Health

## 2019-08-18 VITALS — BP 132/72 | HR 68 | Temp 97.6°F | Resp 16 | Ht 67.0 in | Wt 159.0 lb

## 2019-08-18 DIAGNOSIS — E538 Deficiency of other specified B group vitamins: Secondary | ICD-10-CM | POA: Diagnosis not present

## 2019-08-18 DIAGNOSIS — Z0000 Encounter for general adult medical examination without abnormal findings: Secondary | ICD-10-CM

## 2019-08-18 DIAGNOSIS — R6889 Other general symptoms and signs: Secondary | ICD-10-CM

## 2019-08-18 DIAGNOSIS — B351 Tinea unguium: Secondary | ICD-10-CM | POA: Diagnosis not present

## 2019-08-18 DIAGNOSIS — Z79899 Other long term (current) drug therapy: Secondary | ICD-10-CM

## 2019-08-18 DIAGNOSIS — Z6824 Body mass index (BMI) 24.0-24.9, adult: Secondary | ICD-10-CM

## 2019-08-18 DIAGNOSIS — R0989 Other specified symptoms and signs involving the circulatory and respiratory systems: Secondary | ICD-10-CM | POA: Diagnosis not present

## 2019-08-18 DIAGNOSIS — E559 Vitamin D deficiency, unspecified: Secondary | ICD-10-CM | POA: Diagnosis not present

## 2019-08-18 DIAGNOSIS — T7840XS Allergy, unspecified, sequela: Secondary | ICD-10-CM

## 2019-08-18 DIAGNOSIS — R200 Anesthesia of skin: Secondary | ICD-10-CM

## 2019-08-18 DIAGNOSIS — E782 Mixed hyperlipidemia: Secondary | ICD-10-CM | POA: Diagnosis not present

## 2019-08-18 DIAGNOSIS — Z0001 Encounter for general adult medical examination with abnormal findings: Secondary | ICD-10-CM

## 2019-08-18 DIAGNOSIS — Z23 Encounter for immunization: Secondary | ICD-10-CM

## 2019-08-18 DIAGNOSIS — R7309 Other abnormal glucose: Secondary | ICD-10-CM | POA: Diagnosis not present

## 2019-08-18 DIAGNOSIS — F3342 Major depressive disorder, recurrent, in full remission: Secondary | ICD-10-CM

## 2019-08-18 DIAGNOSIS — Z1159 Encounter for screening for other viral diseases: Secondary | ICD-10-CM | POA: Diagnosis not present

## 2019-08-18 DIAGNOSIS — R202 Paresthesia of skin: Secondary | ICD-10-CM

## 2019-08-18 NOTE — Patient Instructions (Addendum)
Francisco Dixon , Thank you for taking time to come for your Medicare Wellness Visit. I appreciate your ongoing commitment to your health goals. Please review the following plan we discussed and let me know if I can assist you in the future.   These are the goals we discussed: Goals    . Exercise 150 min/wk Moderate Activity    . HEMOGLOBIN A1C < 5.7    . LDL CALC < 100       This is a list of the screening recommended for you and due dates:  Health Maintenance  Topic Date Due  .  Hepatitis C: One time screening is recommended by Center for Disease Control  (CDC) for  adults born from [birth year] through 1965.   Never done  . Pneumonia vaccines (2 of 2 - PPSV23) 04/30/2019  . Colon Cancer Screening  11/15/2025  . Tetanus Vaccine  01/04/2027  . COVID-19 Vaccine  Completed  . Flu Shot  Discontinued     Try ear drops/oil in R ear, soak for 15-20 min then irrigate in shower with a baby bulb syringe  Recommend allowing nails to grow past the edge of soft tissue, soaking thick toe nails then using file on edges     High-Fiber Diet Fiber, also called dietary fiber, is a type of carbohydrate that is found in fruits, vegetables, whole grains, and beans. A high-fiber diet can have many health benefits. Your health care provider may recommend a high-fiber diet to help:  Prevent constipation. Fiber can make your bowel movements more regular.  Lower your cholesterol.  Relieve the following conditions: ? Swelling of veins in the anus (hemorrhoids). ? Swelling and irritation (inflammation) of specific areas of the digestive tract (uncomplicated diverticulosis). ? A problem of the large intestine (colon) that sometimes causes pain and diarrhea (irritable bowel syndrome, IBS).  Prevent overeating as part of a weight-loss plan.  Prevent heart disease, type 2 diabetes, and certain cancers. What is my plan? The recommended daily fiber intake in grams (g) includes:  38 g for men age 89 or  younger.  30 g for men over age 44.  56 g for women age 27 or younger.  21 g for women over age 46. You can get the recommended daily intake of dietary fiber by:  Eating a variety of fruits, vegetables, grains, and beans.  Taking a fiber supplement, if it is not possible to get enough fiber through your diet. What do I need to know about a high-fiber diet?  It is better to get fiber through food sources rather than from fiber supplements. There is not a lot of research about how effective supplements are.  Always check the fiber content on the nutrition facts label of any prepackaged food. Look for foods that contain 5 g of fiber or more per serving.  Talk with a diet and nutrition specialist (dietitian) if you have questions about specific foods that are recommended or not recommended for your medical condition, especially if those foods are not listed below.  Gradually increase how much fiber you consume. If you increase your intake of dietary fiber too quickly, you may have bloating, cramping, or gas.  Drink plenty of water. Water helps you to digest fiber. What are tips for following this plan?  Eat a wide variety of high-fiber foods.  Make sure that half of the grains that you eat each day are whole grains.  Eat breads and cereals that are made with whole-grain flour instead of  refined flour or white flour.  Eat brown rice, bulgur wheat, or millet instead of white rice.  Start the day with a breakfast that is high in fiber, such as a cereal that contains 5 g of fiber or more per serving.  Use beans in place of meat in soups, salads, and pasta dishes.  Eat high-fiber snacks, such as berries, raw vegetables, nuts, and popcorn.  Choose whole fruits and vegetables instead of processed forms like juice or sauce. What foods can I eat?  Fruits Berries. Pears. Apples. Oranges. Avocado. Prunes and raisins. Dried figs. Vegetables Sweet potatoes. Spinach. Kale. Artichokes.  Cabbage. Broccoli. Cauliflower. Green peas. Carrots. Squash. Grains Whole-grain breads. Multigrain cereal. Oats and oatmeal. Brown rice. Barley. Bulgur wheat. [place]. Quinoa. Bran muffins. Popcorn. Rye wafer crackers. Meats and other proteins Navy, kidney, and pinto beans. Soybeans. Split peas. Lentils. Nuts and seeds. Dairy Fiber-fortified yogurt. Beverages Fiber-fortified soy milk. Fiber-fortified orange juice. Other foods Fiber bars. The items listed above may not be a complete list of recommended foods and beverages. Contact a dietitian for more options. What foods are not recommended? Fruits Fruit juice. Cooked, strained fruit. Vegetables Fried potatoes. Canned vegetables. Well-cooked vegetables. Grains White bread. Pasta made with refined flour. White rice. Meats and other proteins Fatty cuts of meat. Fried chicken or fried fish. Dairy Milk. Yogurt. Cream cheese. Sour cream. Fats and oils Butters. Beverages Soft drinks. Other foods Cakes and pastries. The items listed above may not be a complete list of foods and beverages to avoid. Contact a dietitian for more information. Summary  Fiber is a type of carbohydrate. It is found in fruits, vegetables, whole grains, and beans.  There are many health benefits of eating a high-fiber diet, such as preventing constipation, lowering blood cholesterol, helping with weight loss, and reducing your risk of heart disease, diabetes, and certain cancers.  Gradually increase your intake of fiber. Increasing too fast can result in cramping, bloating, and gas. Drink plenty of water while you increase your fiber.  The best sources of fiber include whole fruits and vegetables, whole grains, nuts, seeds, and beans. This information is not intended to replace advice given to you by your health care provider. Make sure you discuss any questions you have with your health care provider. Document Revised: 02/05/2017 Document Reviewed:  02/05/2017 Elsevier Patient Education  2020 [employer].

## 2019-08-19 ENCOUNTER — Other Ambulatory Visit: Payer: Self-pay | Admitting: Adult Health

## 2019-08-19 DIAGNOSIS — E782 Mixed hyperlipidemia: Secondary | ICD-10-CM

## 2019-08-19 LAB — CBC WITH DIFFERENTIAL/PLATELET
Absolute Monocytes: 540 cells/uL (ref 200–950)
Basophils Absolute: 70 cells/uL (ref 0–200)
Basophils Relative: 1.4 %
Eosinophils Absolute: 320 cells/uL (ref 15–500)
Eosinophils Relative: 6.4 %
HCT: 42.9 % (ref 38.5–50.0)
Hemoglobin: 14 g/dL (ref 13.2–17.1)
Lymphs Abs: 1505 cells/uL (ref 850–3900)
MCH: 29.6 pg (ref 27.0–33.0)
MCHC: 32.6 g/dL (ref 32.0–36.0)
MCV: 90.7 fL (ref 80.0–100.0)
MPV: 10.2 fL (ref 7.5–12.5)
Monocytes Relative: 10.8 %
Neutro Abs: 2565 cells/uL (ref 1500–7800)
Neutrophils Relative %: 51.3 %
Platelets: 237 10*3/uL (ref 140–400)
RBC: 4.73 10*6/uL (ref 4.20–5.80)
RDW: 12.7 % (ref 11.0–15.0)
Total Lymphocyte: 30.1 %
WBC: 5 10*3/uL (ref 3.8–10.8)

## 2019-08-19 LAB — COMPLETE METABOLIC PANEL WITH GFR
AG Ratio: 1.8 (calc) (ref 1.0–2.5)
ALT: 16 U/L (ref 9–46)
AST: 18 U/L (ref 10–35)
Albumin: 4.4 g/dL (ref 3.6–5.1)
Alkaline phosphatase (APISO): 50 U/L (ref 35–144)
BUN: 19 mg/dL (ref 7–25)
CO2: 29 mmol/L (ref 20–32)
Calcium: 9.9 mg/dL (ref 8.6–10.3)
Chloride: 106 mmol/L (ref 98–110)
Creat: 1.06 mg/dL (ref 0.70–1.25)
GFR, Est African American: 83 mL/min/{1.73_m2} (ref >=60)
GFR, Est Non African American: 71 mL/min/{1.73_m2} (ref >=60)
Globulin: 2.4 g/dL (calc) (ref 1.9–3.7)
Glucose, Bld: 78 mg/dL (ref 65–99)
Potassium: 4.5 mmol/L (ref 3.5–5.3)
Sodium: 141 mmol/L (ref 135–146)
Total Bilirubin: 0.7 mg/dL (ref 0.2–1.2)
Total Protein: 6.8 g/dL (ref 6.1–8.1)

## 2019-08-19 LAB — TSH: TSH: 1.68 mIU/L (ref 0.40–4.50)

## 2019-08-19 LAB — LIPID PANEL
Cholesterol: 176 mg/dL (ref <200)
HDL: 44 mg/dL (ref >=40)
LDL Cholesterol (Calc): 110 mg/dL (calc) — ABNORMAL HIGH
Non-HDL Cholesterol (Calc): 132 mg/dL (calc) — ABNORMAL HIGH (ref <130)
Total CHOL/HDL Ratio: 4 (calc) (ref <5.0)
Triglycerides: 113 mg/dL (ref <150)

## 2019-08-19 LAB — HEPATITIS C ANTIBODY
Hepatitis C Ab: NONREACTIVE
SIGNAL TO CUT-OFF: 0.01 (ref <1.00)

## 2019-08-19 MED ORDER — ROSUVASTATIN CALCIUM 20 MG PO TABS: ORAL_TABLET | ORAL | 3 refills | Status: DC

## 2019-11-21 IMAGING — DX DG LUMBAR SPINE COMPLETE 4+V
5 series · 5 of 5 positions shown · non-contrast
Comparison: None.

CLINICAL DATA: Numbness and tingling in both feet

EXAM:
LUMBAR SPINE - COMPLETE 4+ VIEW

[l-spine ap]
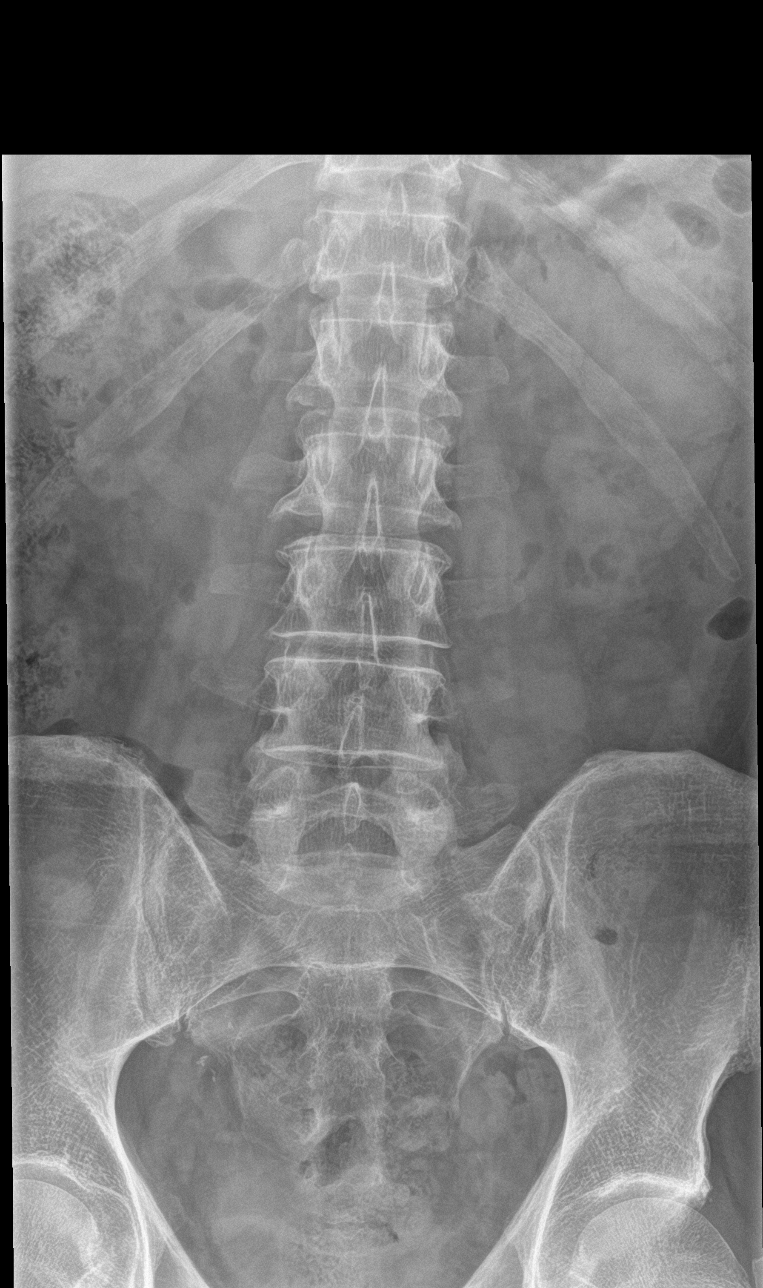

[l-spine obl (1 of 2)]
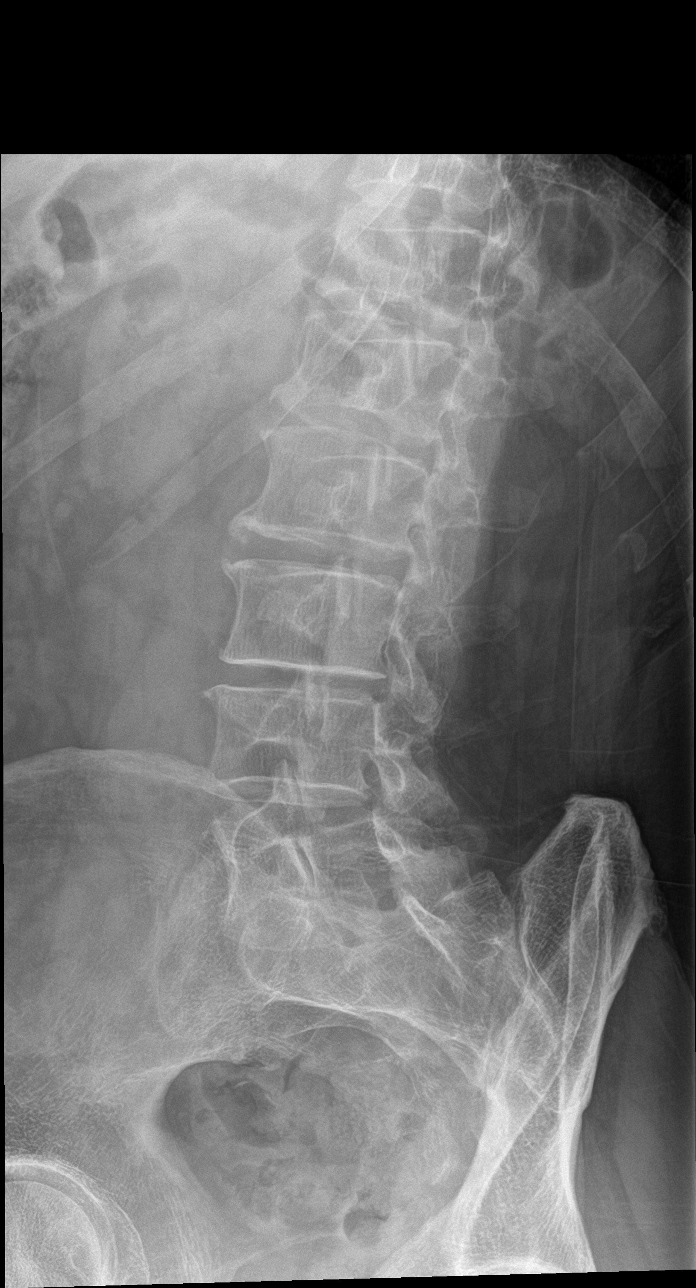

[l-spine obl (2 of 2)]
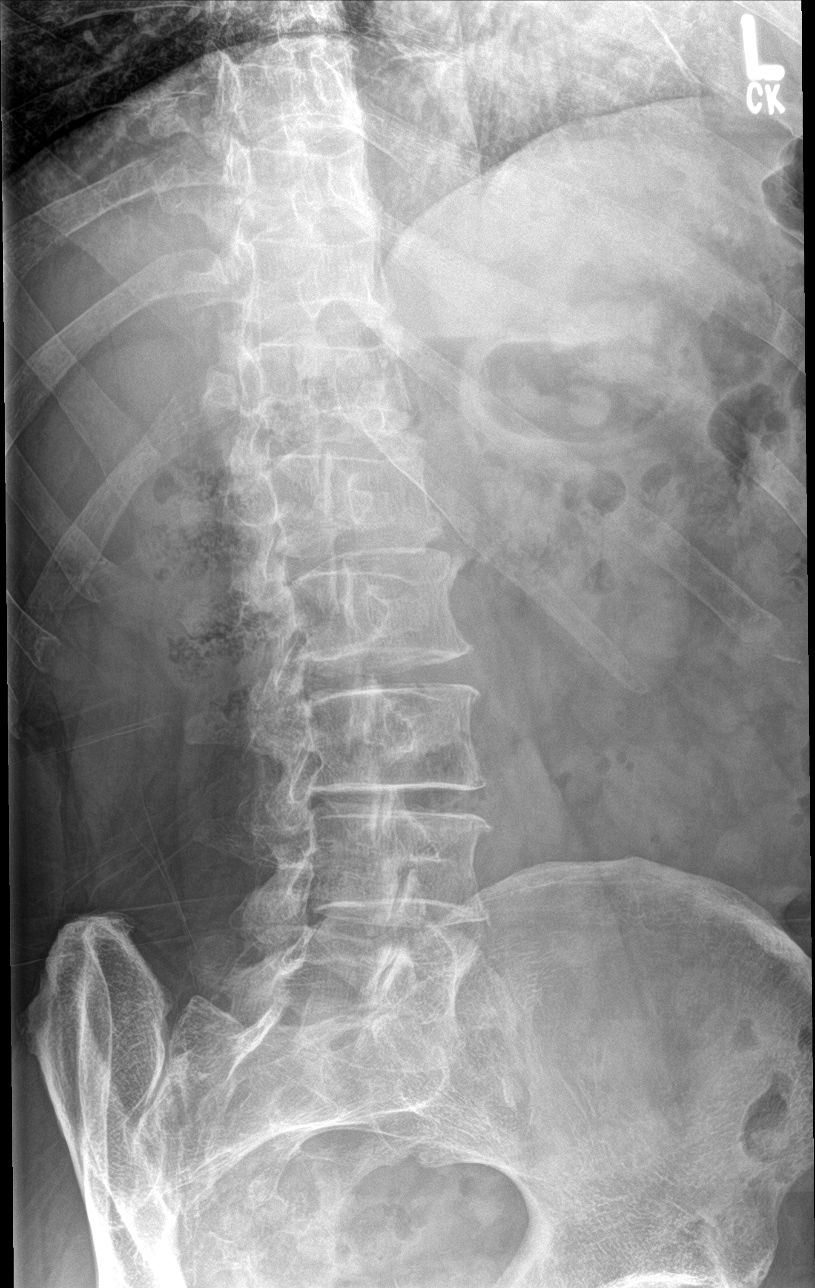

[l-spine lat]
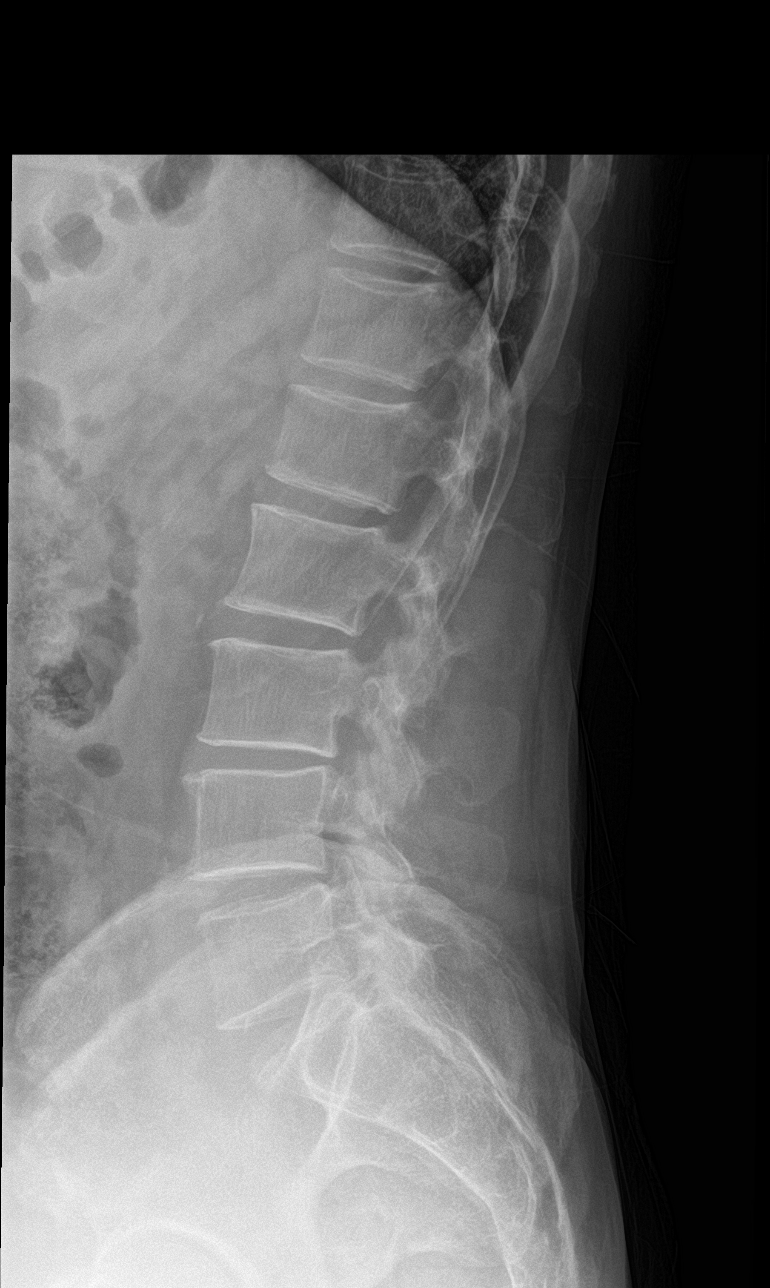

[l-spine spot]
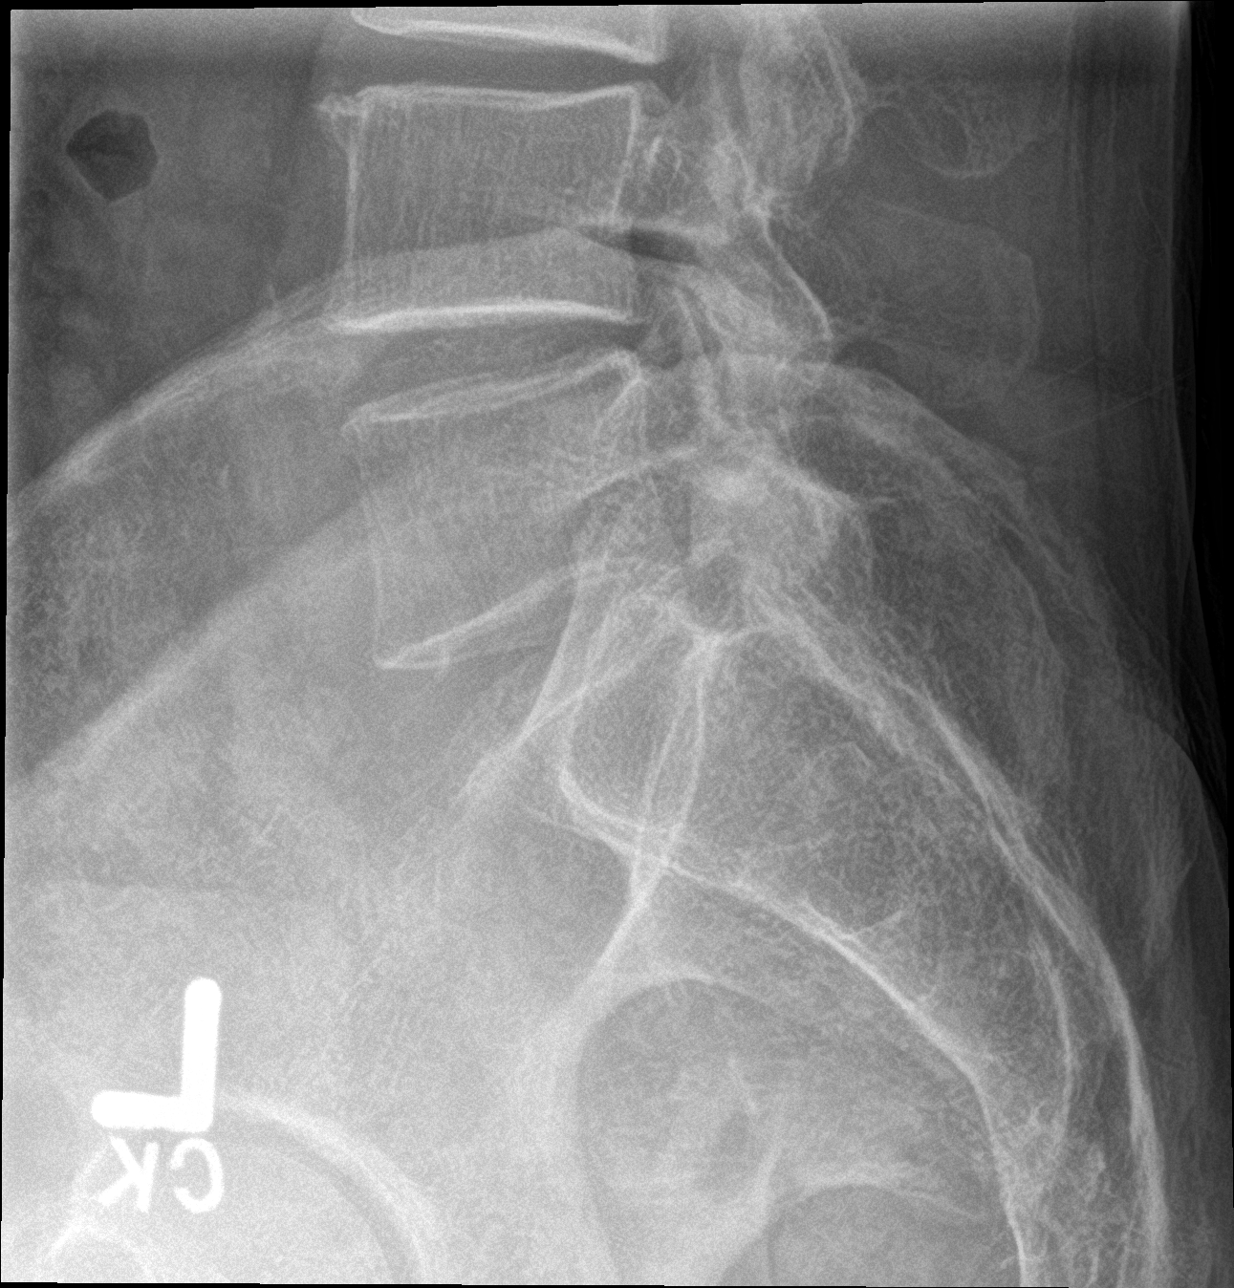

[5 of 5 positions shown; findings below may reference images not displayed]

FINDINGS: Normal alignment.  Negative for fracture or pars defect.

Mild lumbar disc degeneration L1-2, L2-3, L3-4, L4-5 with mild disc
space narrowing and early spurring. SI joints intact.
IMPRESSION: Mild lumbar disc degeneration

## 2019-12-02 ENCOUNTER — Encounter: Payer: Self-pay | Admitting: Internal Medicine

## 2019-12-02 ENCOUNTER — Ambulatory Visit (INDEPENDENT_AMBULATORY_CARE_PROVIDER_SITE_OTHER): Payer: Medicare Other | Admitting: Internal Medicine

## 2019-12-02 ENCOUNTER — Other Ambulatory Visit: Payer: Self-pay

## 2019-12-02 VITALS — BP 122/80 | HR 60 | Temp 97.2°F | Resp 16 | Ht 67.0 in | Wt 161.0 lb

## 2019-12-02 DIAGNOSIS — E782 Mixed hyperlipidemia: Secondary | ICD-10-CM | POA: Diagnosis not present

## 2019-12-02 DIAGNOSIS — R7309 Other abnormal glucose: Secondary | ICD-10-CM | POA: Diagnosis not present

## 2019-12-02 DIAGNOSIS — Z79899 Other long term (current) drug therapy: Secondary | ICD-10-CM

## 2019-12-02 DIAGNOSIS — R0989 Other specified symptoms and signs involving the circulatory and respiratory systems: Secondary | ICD-10-CM | POA: Diagnosis not present

## 2019-12-02 DIAGNOSIS — E559 Vitamin D deficiency, unspecified: Secondary | ICD-10-CM | POA: Diagnosis not present

## 2019-12-02 DIAGNOSIS — E538 Deficiency of other specified B group vitamins: Secondary | ICD-10-CM | POA: Diagnosis not present

## 2019-12-02 MED ORDER — DEXAMETHASONE 4 MG PO TABS: ORAL_TABLET | ORAL | 1 refills | Status: DC

## 2019-12-02 NOTE — Patient Instructions (Signed)

## 2019-12-02 NOTE — Progress Notes (Signed)
History of Present Illness:       This very nice 69 y.o.  [name] presents for 6 month follow up with HTN, HLD, Pre-Diabetes and Vitamin D Deficiency. Patient also is on parenteral B12 replacement. Today he does c/o 2 month hx/o  Rt hip pains with ambulation.       Patient is followed expectantly for Labile HTN & BP has been controlled and today's BP is at goal -  122/80. Patient has had no complaints of any cardiac type chest pain, palpitations, dyspnea / orthopnea / PND, dizziness, claudication, or dependent edema.       Hyperlipidemia is controlled with diet & Rosuvastatin. Patient denies myalgias or other med SE's. Last Lipids were not at goal:  Lab Results  Component Value Date   CHOL 176 08/18/2019   HDL 44 08/18/2019   LDLCALC 110 (H) 08/18/2019   TRIG 113 08/18/2019   CHOLHDL 4.0 08/18/2019     Also, the patient is also monitored expectantly for glucose intolerance. and has had no symptoms of reactive hypoglycemia, diabetic polys, paresthesias or visual blurring.  Last A1c was near goal:  Lab Results  Component Value Date   HGBA1C 5.7 (H) 05/15/2019   Wt Readings from Last 3 Encounters:  12/02/19 161 lb (73 kg)  08/18/19 159 lb (72.1 kg)  05/15/19 159 lb 12.8 oz (72.5 kg)           Further, the patient also has history of Vitamin D Deficiency ("29"/2017) and supplements vitamin D without any suspected side-effects. Last vitamin D was at goal:   Lab Results  Component Value Date   VD25OH 89 05/15/2019    Current Outpatient Medications on File Prior to Visit  Medication Sig  . aspirin EC 81 MG tablet Take 81 mg by mouth daily.  . Cholecalciferol (VITAMIN D3) 5000 units CAPS Takes 2 caps (10,000 Units) daily  . cyanocobalamin (,VITAMIN B-12,) 1000 MCG/ML injection Inject 1026mcg once weekly for 8 weeks, then once a month  . Flaxseed, Linseed, (FLAXSEED OIL) 1000 MG CAPS Take 2 capsules by mouth daily.  . meclizine (ANTIVERT) 25 MG tablet Take 1/2 to 1 tablet 2  to 3 x /day as needed for Dizziness / Vertigo  . meloxicam (MOBIC) 15 MG tablet Take 1/2 to 1 tablet daily with food for Pain & Inflammation - try limit to 5 days /week to avoid kidney damage  . NEEDLE, DISP, 25 G (BD SAFETYGLIDE SHIELDED NEEDLE) 25G X 1" MISC Use for B12 injections  . Omega-3 Fatty Acids (FISH OIL) 1000 MG CAPS Takes 1 capsule daily  . OVER THE COUNTER MEDICATION OTC allergy tablet 1 tablet daily.  . rosuvastatin (CRESTOR) 20 MG tablet Take 1 tablet 5 days a week for Cholesterol  . SYRINGE-NEEDLE, DISP, 3 ML (BD SAFETYGLIDE SYRINGE/NEEDLE) 25G X 1" 3 ML MISC Use for B12 injections.  . SYRINGE/NEEDLE, DISP, 1 ML (B-D SYRINGE/NEEDLE 1CC/25GX5/8) 25G X 5/8" 1 ML MISC 1 SQ injection once weekly  . venlafaxine XR (EFFEXOR XR) 75 MG 24 hr capsule Take 1 capsule Daily for Mood   No current facility-administered medications on file prior to visit.    Allergies  Allergen Reactions  . Other     Bee stings    PMHx:   Past Medical History:  Diagnosis Date  . GERD (gastroesophageal reflux disease)   . Hypercholesterolemia   . Hypertension    labile   . Plantar fasciitis, right 12/07/2016  . Vitamin D  deficiency     Immunization History  Administered Date(s) Administered  . PFIZER SARS-COV-2 Vaccination 05/24/2019, 06/19/2019  . Pneumococcal Conjugate-13 04/29/2018  . Pneumococcal Polysaccharide-23 08/18/2019  . Td 01/03/2017    Past Surgical History:  Procedure Laterality Date  . COLONOSCOPY N/A 2002,2007,2012  . ROTATOR CUFF REPAIR Right 2015    FHx:    Reviewed / unchanged  SHx:    Reviewed / unchanged   Systems Review:  Constitutional: Denies fever, chills, wt changes, headaches, insomnia, fatigue, night sweats, change in appetite. Eyes: Denies redness, blurred vision, diplopia, discharge, itchy, watery eyes.  ENT: Denies discharge, congestion, post nasal drip, epistaxis, sore throat, earache, hearing loss, dental pain, tinnitus, vertigo, sinus pain,  snoring.  CV: Denies chest pain, palpitations, irregular heartbeat, syncope, dyspnea, diaphoresis, orthopnea, PND, claudication or edema. Respiratory: denies cough, dyspnea, DOE, pleurisy, hoarseness, laryngitis, wheezing.  Gastrointestinal: Denies dysphagia, odynophagia, heartburn, reflux, water brash, abdominal pain or cramps, nausea, vomiting, bloating, diarrhea, constipation, hematemesis, melena, hematochezia  or hemorrhoids. Genitourinary: Denies dysuria, frequency, urgency, nocturia, hesitancy, discharge, hematuria or flank pain. Musculoskeletal: Denies arthralgias, myalgias, stiffness, jt. swelling, pain, limping or strain/sprain.  Skin: Denies pruritus, rash, hives, warts, acne, eczema or change in skin lesion(s). Neuro: No weakness, tremor, incoordination, spasms, paresthesia or pain. Psychiatric: Denies confusion, memory loss or sensory loss. Endo: Denies change in weight, skin or hair change.  Heme/Lymph: No excessive bleeding, bruising or enlarged lymph nodes.  Physical Exam  BP 122/80   Pulse 60   Temp (!) 97.2 F (36.2 C)   Resp 16   Ht 5\' 7"  (1.702 m)   Wt 161 lb (73 kg)   BMI 25.22 kg/m   Appears  well nourished, well groomed  and in no distress.  Eyes: PERRLA, EOMs, conjunctiva no swelling or erythema. Sinuses: No frontal/maxillary tenderness ENT/Mouth: EAC's clear, TM's nl w/o erythema, bulging. Nares clear w/o erythema, swelling, exudates. Oropharynx clear without erythema or exudates. Oral hygiene is good. Tongue normal, non obstructing. Hearing intact.  Neck: Supple. Thyroid not palpable. Car 2+/2+ without bruits, nodes or JVD. Chest: Respirations nl with BS clear & equal w/o rales, rhonchi, wheezing or stridor.  Cor: Heart sounds normal w/ regular rate and rhythm without sig. murmurs, gallops, clicks or rubs. Peripheral pulses normal and equal  without edema.  Abdomen: Soft & bowel sounds normal. Non-tender w/o guarding, rebound, hernias, masses or organomegaly.    Lymphatics: Unremarkable.  Musculoskeletal: Full ROM all peripheral extremities, joint stability, 5/5 strength and normal gait.  Skin: Warm, dry without exposed rashes, lesions or ecchymosis apparent.  Neuro: Cranial nerves intact, reflexes equal bilaterally. Sensory-motor testing grossly intact. Tendon reflexes grossly intact.  Pysch: Alert & oriented x 3.  Insight and judgement nl & appropriate. No ideations.  Assessment and Plan:  1. Labile hypertension  - Continue medication, monitor blood pressure at home.  - Continue DASH diet.  Reminder to go to the ER if any CP,  SOB, nausea, dizziness, severe HA, changes vision/speech.  - CBC with Differential/Platelet - COMPLETE METABOLIC PANEL WITH GFR - Magnesium - TSH  2. Hyperlipidemia, mixed  - Continue diet/meds, exercise,& lifestyle modifications.  - Continue monitor periodic cholesterol/liver & renal functions   - Lipid panel - TSH  3. Abnormal glucose  - Continue diet, exercise  - Lifestyle modifications.  - Monitor appropriate labs.  - Hemoglobin A1c - Insulin, random  4. Vitamin D deficiency  - Continue supplementation.  - VITAMIN D 25 Hydroxy  5. B12 deficiency  - Vitamin  B12  6. Medication management  - CBC with Differential/Platelet - COMPLETE METABOLIC PANEL WITH GFR - Magnesium - Lipid panel - TSH - Hemoglobin A1c - Insulin, random - VITAMIN D 25 Hydroxy - Vitamin B12       Discussed  regular exercise, BP monitoring, weight control to achieve/maintain BMI less than 25 and discussed med and SE's. Recommended labs to assess and monitor clinical status with further disposition pending results of labs. Discussed trial of short course of Decadron pulse / taper for Lt hip pains & if not resolve or worsen, then refer to orthopedist.   The patient was provided an opportunity to ask questions and all were answered. The patient agreed with the plan and demonstrated an understanding of the instructions.  I  provided over 30 minutes of exam, counseling, chart review and  complex critical decision making.   Kirtland Bouchard, MD

## 2019-12-03 ENCOUNTER — Other Ambulatory Visit: Payer: Self-pay | Admitting: Internal Medicine

## 2019-12-03 DIAGNOSIS — E782 Mixed hyperlipidemia: Secondary | ICD-10-CM

## 2019-12-03 LAB — CBC WITH DIFFERENTIAL/PLATELET
Absolute Monocytes: 353 cells/uL (ref 200–950)
Basophils Absolute: 49 cells/uL (ref 0–200)
Basophils Relative: 1.2 %
Eosinophils Absolute: 180 cells/uL (ref 15–500)
Eosinophils Relative: 4.4 %
HCT: 41.8 % (ref 38.5–50.0)
Hemoglobin: 13.9 g/dL (ref 13.2–17.1)
Lymphs Abs: 1132 cells/uL (ref 850–3900)
MCH: 30 pg (ref 27.0–33.0)
MCHC: 33.3 g/dL (ref 32.0–36.0)
MCV: 90.1 fL (ref 80.0–100.0)
MPV: 10.5 fL (ref 7.5–12.5)
Monocytes Relative: 8.6 %
Neutro Abs: 2386 cells/uL (ref 1500–7800)
Neutrophils Relative %: 58.2 %
Platelets: 239 10*3/uL (ref 140–400)
RBC: 4.64 10*6/uL (ref 4.20–5.80)
RDW: 12.7 % (ref 11.0–15.0)
Total Lymphocyte: 27.6 %
WBC: 4.1 10*3/uL (ref 3.8–10.8)

## 2019-12-03 LAB — COMPLETE METABOLIC PANEL WITH GFR
AG Ratio: 1.6 (calc) (ref 1.0–2.5)
ALT: 19 U/L (ref 9–46)
AST: 19 U/L (ref 10–35)
Albumin: 4.4 g/dL (ref 3.6–5.1)
Alkaline phosphatase (APISO): 51 U/L (ref 35–144)
BUN: 18 mg/dL (ref 7–25)
CO2: 28 mmol/L (ref 20–32)
Calcium: 10.1 mg/dL (ref 8.6–10.3)
Chloride: 105 mmol/L (ref 98–110)
Creat: 1.04 mg/dL (ref 0.70–1.25)
GFR, Est African American: 85 mL/min/{1.73_m2} (ref >=60)
GFR, Est Non African American: 73 mL/min/{1.73_m2} (ref >=60)
Globulin: 2.8 g/dL (calc) (ref 1.9–3.7)
Glucose, Bld: 61 mg/dL — ABNORMAL LOW (ref 65–99)
Potassium: 4.6 mmol/L (ref 3.5–5.3)
Sodium: 142 mmol/L (ref 135–146)
Total Bilirubin: 0.5 mg/dL (ref 0.2–1.2)
Total Protein: 7.2 g/dL (ref 6.1–8.1)

## 2019-12-03 LAB — LIPID PANEL
Cholesterol: 195 mg/dL (ref <200)
HDL: 47 mg/dL (ref >=40)
LDL Cholesterol (Calc): 120 mg/dL (calc) — ABNORMAL HIGH
Non-HDL Cholesterol (Calc): 148 mg/dL (calc) — ABNORMAL HIGH (ref <130)
Total CHOL/HDL Ratio: 4.1 (calc) (ref <5.0)
Triglycerides: 166 mg/dL — ABNORMAL HIGH (ref <150)

## 2019-12-03 LAB — HEMOGLOBIN A1C
Hgb A1c MFr Bld: 5.6 % of total Hgb (ref <5.7)
Mean Plasma Glucose: 114 (calc)
eAG (mmol/L): 6.3 (calc)

## 2019-12-03 LAB — TSH: TSH: 3.24 mIU/L (ref 0.40–4.50)

## 2019-12-03 LAB — MAGNESIUM: Magnesium: 2.1 mg/dL (ref 1.5–2.5)

## 2019-12-03 LAB — VITAMIN B12: Vitamin B-12: 516 pg/mL (ref 200–1100)

## 2019-12-03 LAB — INSULIN, RANDOM: Insulin: 14.9 u[IU]/mL

## 2019-12-03 LAB — VITAMIN D 25 HYDROXY (VIT D DEFICIENCY, FRACTURES): Vit D, 25-Hydroxy: 87 ng/mL (ref 30–100)

## 2019-12-03 MED ORDER — EZETIMIBE 10 MG PO TABS: ORAL_TABLET | ORAL | 1 refills | Status: DC

## 2019-12-03 NOTE — Progress Notes (Signed)
============================================================  -   Test results slightly outside the reference range are not unusual. If there is anything important, I will review this with you,  otherwise it is considered normal test values.  If you have further questions,  please do not hesitate to contact me at the office or via My Chart.  ============================================================  -  Total Chol = 195  -  Excellent   - Very low risk for Heart Attack  / Stroke ===========================================================  - BUT..............  - Bad / dangerous LDL Chol = 120 - is too high  (Ideal or Goal is less than 70  !  )  - So........................ continue Crestor Same - every other day &   - New Rx for Zetia (Ezetimibe) [facility] to [facility]  ===========================================================  - Diet is still important  - Cholesterol only comes from animal sources  - ie. meat, dairy, egg yolks  - Eat all the vegetables you want.  - Avoid meat, especially red meat - Beef AND Pork .  - Avoid cheese & dairy - milk & ice cream.     - Cheese is the most concentrated form of trans-fats which  is the worst thing to clog up our arteries.   - Veggie cheese is OK which can be found in the fresh  produce section at [employer] or [employer] or [employer] ============================================================  -  A1c is Back in Normal Range - Great - No Diabetes ============================================================  -  Vitamin D = 87 - Excellent  ============================================================  -  Vitamin B12 - Normal   ============================================================  -  All Else - CBC - Kidneys - Electrolytes - Liver - Magnesium & Thyroid    - all  Normal / OK ============================================================

## 2019-12-09 DIAGNOSIS — H2513 Age-related nuclear cataract, bilateral: Secondary | ICD-10-CM | POA: Diagnosis not present

## 2020-01-19 ENCOUNTER — Encounter: Payer: Medicare Other | Admitting: Internal Medicine

## 2020-02-05 ENCOUNTER — Ambulatory Visit (INDEPENDENT_AMBULATORY_CARE_PROVIDER_SITE_OTHER): Payer: Medicare Other | Admitting: Internal Medicine

## 2020-02-05 ENCOUNTER — Other Ambulatory Visit: Payer: Self-pay

## 2020-02-05 ENCOUNTER — Encounter: Payer: Self-pay | Admitting: Internal Medicine

## 2020-02-05 VITALS — BP 122/80 | HR 56 | Temp 97.4°F | Resp 16 | Ht 69.0 in | Wt 157.0 lb

## 2020-02-05 DIAGNOSIS — R0989 Other specified symptoms and signs involving the circulatory and respiratory systems: Secondary | ICD-10-CM

## 2020-02-05 DIAGNOSIS — B079 Viral wart, unspecified: Secondary | ICD-10-CM | POA: Diagnosis not present

## 2020-02-05 NOTE — Progress Notes (Signed)
   History of Present Illness:      This very nice 69 y.o. [name] with HTN, HLD, Pre-Diabetes and Vitamin D Deficiency presents feeling well.He denies c/o HAs dizziness, CP, palpitations, dyspnea or edema. He does have concern re: a skin lesion at the Rt root of his nose.   Medications  .  ezetimibe  10 MG tablet, Take 1 tablet daily for Cholesterol .  rosuvastatin 20 MG tablet, Take 1 tablet 5 days a week for Cholesterol .  meloxicam 15 MG tablet, Take 1/2 to 1 tablet daily with food for Pain & Inflammation .  VITAMIN B-12, Inject 1053mcg once weekly for 8 weeks, then once a month .  VITAMIN D 5000 units CAPS, Takes 2 caps (10,000 Units) daily .  FLAXSEED OIL 1000 MG CAPS, Take 2 capsules  daily. .  meclizine (ANTIVERT) 25 MG tablet, Take 1/2 to 1 tablet 2 to 3 x /day as needed for Dizziness  .  Omega-3 FISH OIL 1000 MG , Takes 1 capsule daily .  venlafaxine XR 75 MG , Take 1 capsule Daily for Mood  Problem list He has Labile hypertension; Hyperlipidemia, mixed; Vitamin D deficiency; Medication management; Abnormal glucose; Allergy; Major depression in full remission ([place]); Numbness and tingling of both feet; and B12 deficiency on their problem list.   Observations/Objective:   BP 122/80   Pulse (!) 56   Temp (!) 97.4 F (36.3 C)   Resp 16   Ht 5\' 9"  (1.753 m)   Wt 157 lb (71.2 kg)   SpO2 92%   BMI 23.18 kg/m   HEENT - WNL. Neck - supple.  Chest - Clear equal BS. Cor - Nl HS. RRR w/o sig MGR. PP 1(+). No edema. MS- FROM w/o deformities.  Gait Nl. Neuro -  Nl w/o focal abnormalities. Skin- there is a 5 mm x 9 mm light brown raised filamentous skin lesion at the Right base of the root of the nose 2 cm anterior to the medial Rt eye.  Procedure ( CPT:  38182)     After informed consent and aseptic prep the lesion was anesthetized with 0.5 ml of Marcaine 0.55. then the lesion suspect verruca , was sharply excised by shave technique with a #10 scalpel. Then the wound base was  hyfrecated for hemostasis and electrodestruction of an residual lesion fragments.     Then Antibiotic ung was applied and sterile bandage.  Patent was instructed in post-op wound care.   Assessment and Plan:  1. Labile hypertension   2. Verruca  Follow Up Instructions:       I discussed the assessment and treatment plan with the patient. The patient was provided an opportunity to ask questions and all were answered. The patient agreed with the plan and demonstrated an understanding of the instructions.   Kirtland Bouchard, MD

## 2020-02-13 ENCOUNTER — Other Ambulatory Visit: Payer: Self-pay | Admitting: Internal Medicine

## 2020-02-13 DIAGNOSIS — F332 Major depressive disorder, recurrent severe without psychotic features: Secondary | ICD-10-CM

## 2020-02-16 DIAGNOSIS — Z23 Encounter for immunization: Secondary | ICD-10-CM | POA: Diagnosis not present

## 2020-03-05 ENCOUNTER — Ambulatory Visit: Payer: Medicare Other | Admitting: Physician Assistant

## 2020-03-09 ENCOUNTER — Ambulatory Visit: Payer: Medicare Other | Admitting: Adult Health

## 2020-03-29 NOTE — Progress Notes (Signed)
3 MONTH FOLLOW UP Assessment:   Labile hypertension Currently at goal without medications Monitor blood pressure at home; call if consistently over 130/80 Continue DASH diet.   Reminder to go to the ER if any CP, SOB, nausea, dizziness, severe HA, changes vision/speech, left arm numbness and tingling and jaw pain.  Vitamin D deficiency At goal at recent check; continue to recommend supplementation for goal of 70-100 Defer vitamin D level  Mixed hyperlipidemia Continue medications; if above goal increase rosuvastatin to daily  Continue low cholesterol diet and exercise.  Check lipid panel.   Other abnormal glucose (prediabetes) Discussed disease and risks Discussed diet/exercise, weight management  Monitor A1C q19m  BMI 24, adult Continue to recommend diet heavy in fruits and veggies and low in animal meats, cheeses, and dairy products, appropriate calorie intake Discuss exercise recommendations routinely Continue to monitor weight at each visit  Medication management Check CBC, CMP/GFR  B12 deficiency Continue monthly injections   Major depression in remission ([place]) In remission on medication  Lifestyle discussed: diet/exerise, sleep hygiene, stress management, hydration  Muscle cramps Check electrolytes, iron/ferritin Restart stretching, yoga if negative    Over 30 minutes of exam, counseling, chart review, and critical decision making was performed  Future Appointments  Date Time Provider [place]  06/10/2020 11:00 AM Unk Pinto, MD [facility] None  09/08/2020 11:15 AM Liane Comber, NP [facility] None      Subjective:  Francisco Dixon is a 69 y.o. male who presents for 3 month follow up for HTN, hyperlipidemia, prediabetes, and vitamin D Def.   he has a diagnosis of depression and is currently on effexor 75 mg daily, reports symptoms are well controlled on current regimen.   He has persistent numbness/tingling of bilateral feet, not  significantly limiting him. Questionably has been attributed to DDD seen on xray, did see neuro but he declined interventions managing with exercise and stretching, occasional NSAID. Has taken gabapentin in the past.   He does report increased cramping of calves and hamstrings in the last year, was doing lots of yoga/stretching prior to pandemic, plans to restart.   BMI is Body mass index is 23.75 kg/m., he has been working on diet and exercise, very active building deck, installing kitchen cabinets, landscaping, etc, minimal sitting during the day.  Wt Readings from Last 3 Encounters:  03/30/20 160 lb 12.8 oz (72.9 kg)  02/05/20 157 lb (71.2 kg)  12/02/19 161 lb (73 kg)   His blood pressure has been controlled at home, today their BP is BP: 126/80 He does workout. He denies chest pain, shortness of breath, dizziness.   He is on cholesterol medication (crestor 20 mg every other day) and denies myalgias. His cholesterol is not at goal. The cholesterol last visit was:   Lab Results  Component Value Date   CHOL 195 12/02/2019   HDL 47 12/02/2019   LDLCALC 120 (H) 12/02/2019   TRIG 166 (H) 12/02/2019   CHOLHDL 4.1 12/02/2019   He has been working on diet and exercise for hx of prediabetes, and denies foot ulcerations, increased appetite, nausea, paresthesia of the feet, polydipsia, polyuria, visual disturbances, vomiting and weight loss. Last A1C in the office was:  Lab Results  Component Value Date   HGBA1C 5.6 12/02/2019   Last GFR Lab Results  Component Value Date   GFRNONAA 73 12/02/2019    Patient is on Vitamin D supplement and at goal at recent check:   Lab Results  Component Value Date  VD25OH 87 12/02/2019     He is on monthly B12 injections due to history of deficiency.  Lab Results  Component Value Date   [id number] 809 12/02/2019     Medication Review: Current Outpatient Medications on File Prior to Visit  Medication Sig Dispense Refill   Cholecalciferol  (VITAMIN D3) 5000 units CAPS Takes 2 caps (10,000 Units) daily     cyanocobalamin (,VITAMIN B-12,) 1000 MCG/ML injection Inject 1060mcg once weekly for 8 weeks, then once a month 30 mL 2   ezetimibe (ZETIA) 10 MG tablet Take 1 tablet daily for Cholesterol 90 tablet 1   Flaxseed, Linseed, (FLAXSEED OIL) 1000 MG CAPS Take 2 capsules by mouth daily.     meclizine (ANTIVERT) 25 MG tablet Take 1/2 to 1 tablet 2 to 3 x /day as needed for Dizziness / Vertigo 90 tablet 11   meloxicam (MOBIC) 15 MG tablet Take 1/2 to 1 tablet daily with food for Pain & Inflammation - try limit to 5 days /week to avoid kidney damage 90 tablet 3   NEEDLE, DISP, 25 G (BD SAFETYGLIDE SHIELDED NEEDLE) 25G X 1" MISC Use for B12 injections 50 each 0   Omega-3 Fatty Acids (FISH OIL) 1000 MG CAPS Takes 1 capsule daily  0   OVER THE COUNTER MEDICATION OTC allergy tablet 1 tablet daily.     rosuvastatin (CRESTOR) 20 MG tablet Take 1 tablet 5 days a week for Cholesterol 60 tablet 3   SYRINGE-NEEDLE, DISP, 3 ML (BD SAFETYGLIDE SYRINGE/NEEDLE) 25G X 1" 3 ML MISC Use for B12 injections. 50 each 0   SYRINGE/NEEDLE, DISP, 1 ML (B-D SYRINGE/NEEDLE 1CC/25GX5/8) 25G X 5/8" 1 ML MISC 1 SQ injection once weekly 100 each 1   TURMERIC PO Take by mouth daily.     venlafaxine XR (EFFEXOR-XR) 75 MG 24 hr capsule TAKE 1 CAPSULE BY MOUTH DAILY FOR MOOD 90 capsule 3   aspirin EC 81 MG tablet Take 81 mg by mouth daily. (Patient not taking: No sig reported)     No current facility-administered medications on file prior to visit.    Allergies: Allergies  Allergen Reactions   Other     Bee stings    Current Problems (verified) has Labile hypertension; Hyperlipidemia, mixed; Vitamin D deficiency; Medication management; Abnormal glucose; Allergy; Major depression in full remission ([place]); Numbness and tingling of both feet; and B12 deficiency on their problem list.  Surgical: He  has a past surgical history that includes Colonoscopy  (N/A, 2002,2007,2012) and Rotator cuff repair (Right, 2015). Family His family history is not on file. Social history  He reports that he has never smoked. He has never used smokeless tobacco. He reports current alcohol use. He reports that he does not use drugs.  Review of Systems  Constitutional: Negative for malaise/fatigue and weight loss.  HENT: Negative for hearing loss and tinnitus.   Eyes: Negative for blurred vision and double vision.  Respiratory: Negative for cough, shortness of breath and wheezing.   Cardiovascular: Negative for chest pain, palpitations, orthopnea, claudication and leg swelling.  Gastrointestinal: Negative for abdominal pain, blood in stool, constipation, diarrhea, heartburn, melena, nausea and vomiting.  Genitourinary: Negative.   Musculoskeletal: Negative for joint pain and myalgias.  Skin: Negative for rash.  Neurological: Negative for dizziness, tingling, sensory change, weakness and headaches.  Endo/Heme/Allergies: Negative for polydipsia.  Psychiatric/Behavioral: Negative.   All other systems reviewed and are negative.    Objective:   Today's Vitals   03/30/20 1105  BP: 126/80  Pulse: 65  Temp: (!) 96.8 F (36 C)  SpO2: 98%  Weight: 160 lb 12.8 oz (72.9 kg)   Body mass index is 23.75 kg/m.  General appearance: alert, no distress, WD/WN, male HEENT: normocephalic, sclerae anicteric, TMs pearly, nares patent, no discharge or erythema, pharynx normal. HOH with bilateral hearing aids.  Oral cavity: MMM, no lesions Neck: supple, no lymphadenopathy, no thyromegaly, no masses Heart: RRR, normal S1, S2, no murmurs Lungs: CTA bilaterally, no wheezes, rhonchi, or rales Abdomen: +bs, soft, non tender, non distended, no masses, no hepatomegaly, no splenomegaly Musculoskeletal: nontender, no swelling, no obvious deformity Extremities: no edema, no cyanosis, no clubbing Pulses: 2+ symmetric, upper and lower extremities, normal cap  refill Neurological: alert, oriented x 3, CN2-12 intact, strength normal upper extremities and lower extremities, sensation normal throughout, DTRs 2+ throughout, no cerebellar signs, gait normal Psychiatric: normal affect, behavior normal, pleasant    Izora Ribas, NP   03/30/2020

## 2020-03-30 ENCOUNTER — Ambulatory Visit (INDEPENDENT_AMBULATORY_CARE_PROVIDER_SITE_OTHER): Payer: Medicare Other | Admitting: Adult Health

## 2020-03-30 ENCOUNTER — Encounter: Payer: Self-pay | Admitting: Adult Health

## 2020-03-30 ENCOUNTER — Other Ambulatory Visit: Payer: Self-pay

## 2020-03-30 VITALS — BP 126/80 | HR 65 | Temp 96.8°F | Wt 160.8 lb

## 2020-03-30 DIAGNOSIS — R0989 Other specified symptoms and signs involving the circulatory and respiratory systems: Secondary | ICD-10-CM | POA: Diagnosis not present

## 2020-03-30 DIAGNOSIS — E559 Vitamin D deficiency, unspecified: Secondary | ICD-10-CM | POA: Diagnosis not present

## 2020-03-30 DIAGNOSIS — E782 Mixed hyperlipidemia: Secondary | ICD-10-CM

## 2020-03-30 DIAGNOSIS — D649 Anemia, unspecified: Secondary | ICD-10-CM

## 2020-03-30 DIAGNOSIS — F3342 Major depressive disorder, recurrent, in full remission: Secondary | ICD-10-CM | POA: Diagnosis not present

## 2020-03-30 DIAGNOSIS — Z79899 Other long term (current) drug therapy: Secondary | ICD-10-CM | POA: Diagnosis not present

## 2020-03-30 DIAGNOSIS — R7309 Other abnormal glucose: Secondary | ICD-10-CM

## 2020-03-30 NOTE — Patient Instructions (Addendum)
Goals    . Exercise 150 min/wk Moderate Activity    . HEMOGLOBIN A1C < 5.7    . LDL CALC < 100        Try stopping rosuvastatin for 2 weeks - evaluate cramps If no better- restart every other day for 2 weeks, then increase to taking daily      Muscle Cramps and Spasms Muscle cramps and spasms are when muscles tighten by themselves. They usually get better within minutes. Muscle cramps are painful. They are usually stronger and last longer than muscle spasms. Muscle spasms may or may not be painful. They can last a few seconds or much longer. Cramps and spasms can affect any muscle, but they occur most often in the calf muscles of the leg. They are usually not caused by a serious problem. In many cases, the cause is not known. Some common causes include:  Doing more physical work or exercise than your body is ready for.  Using the muscles too much (overuse) by repeating certain movements too many times.  Staying in a certain position for a long time.  Playing a sport or doing an activity without preparing properly.  Using bad form or technique while playing a sport or doing an activity.  Not having enough water in your body (dehydration).  Injury.  Side effects of some medicines.  Low levels of the salts and minerals in your blood (electrolytes), such as low potassium or calcium. Follow these instructions at home: Managing pain and stiffness      Massage, stretch, and relax the muscle. Do this for many minutes at a time.  If told, put heat on tight or tense muscles as often as told by your doctor. Use the heat source that your doctor recommends, such as a moist heat pack or a heating pad. ? Place a towel between your skin and the heat source. ? Leave the heat on for 20-30 minutes. ? Remove the heat if your skin turns bright red. This is very important if you are not able to feel pain, heat, or cold. You may have a greater risk of getting burned.  If told, put ice on the  affected area. This may help if you are sore or have pain after a cramp or spasm. ? Put ice in a plastic bag. ? Place a towel between your skin and the bag. ? Leave the ice on for 20 minutes, 2-3 times a day.  Try taking hot showers or baths to help relax tight muscles. Eating and drinking  Drink enough fluid to keep your pee (urine) pale yellow.  Eat a healthy diet to help ensure that your muscles work well. This should include: ? Fruits and vegetables. ? Lean protein. ? Whole grains. ? Low-fat or nonfat dairy products. General instructions  If you are having cramps often, avoid intense exercise for several days.  Take over-the-counter and prescription medicines only as told by your doctor.  Watch for any changes in your symptoms.  Keep all follow-up visits as told by your doctor. This is important. Contact a doctor if:  Your cramps or spasms get worse or happen more often.  Your cramps or spasms do not get better with time. Summary  Muscle cramps and spasms are when muscles tighten by themselves. They usually get better within minutes.  Cramps and spasms occur most often in the calf muscles of the leg.  Massage, stretch, and relax the muscle. This may help the cramp or spasm go away.  Drink enough fluid to keep your pee (urine) pale yellow. This information is not intended to replace advice given to you by your health care provider. Make sure you discuss any questions you have with your health care provider. Document Revised: 08/27/2017 Document Reviewed: 08/27/2017 Elsevier Patient Education  [place].

## 2020-03-31 LAB — COMPLETE METABOLIC PANEL WITH GFR
AG Ratio: 1.6 (calc) (ref 1.0–2.5)
ALT: 25 U/L (ref 9–46)
AST: 24 U/L (ref 10–35)
Albumin: 4.2 g/dL (ref 3.6–5.1)
Alkaline phosphatase (APISO): 45 U/L (ref 35–144)
BUN: 18 mg/dL (ref 7–25)
CO2: 30 mmol/L (ref 20–32)
Calcium: 9.6 mg/dL (ref 8.6–10.3)
Chloride: 105 mmol/L (ref 98–110)
Creat: 0.93 mg/dL (ref 0.70–1.25)
GFR, Est African American: 97 mL/min/{1.73_m2} (ref >=60)
GFR, Est Non African American: 83 mL/min/{1.73_m2} (ref >=60)
Globulin: 2.6 g/dL (calc) (ref 1.9–3.7)
Glucose, Bld: 82 mg/dL (ref 65–99)
Potassium: 4.3 mmol/L (ref 3.5–5.3)
Sodium: 141 mmol/L (ref 135–146)
Total Bilirubin: 0.6 mg/dL (ref 0.2–1.2)
Total Protein: 6.8 g/dL (ref 6.1–8.1)

## 2020-03-31 LAB — CBC WITH DIFFERENTIAL/PLATELET
Absolute Monocytes: 419 cells/uL (ref 200–950)
Basophils Absolute: 41 cells/uL (ref 0–200)
Basophils Relative: 0.7 %
Eosinophils Absolute: 118 cells/uL (ref 15–500)
Eosinophils Relative: 2 %
HCT: 42.3 % (ref 38.5–50.0)
Hemoglobin: 14.1 g/dL (ref 13.2–17.1)
Lymphs Abs: 1428 cells/uL (ref 850–3900)
MCH: 29.6 pg (ref 27.0–33.0)
MCHC: 33.3 g/dL (ref 32.0–36.0)
MCV: 88.7 fL (ref 80.0–100.0)
MPV: 10 fL (ref 7.5–12.5)
Monocytes Relative: 7.1 %
Neutro Abs: 3894 cells/uL (ref 1500–7800)
Neutrophils Relative %: 66 %
Platelets: 253 10*3/uL (ref 140–400)
RBC: 4.77 10*6/uL (ref 4.20–5.80)
RDW: 12.4 % (ref 11.0–15.0)
Total Lymphocyte: 24.2 %
WBC: 5.9 10*3/uL (ref 3.8–10.8)

## 2020-03-31 LAB — IRON,TIBC AND FERRITIN PANEL
%SAT: 37 % (calc) (ref 20–48)
Ferritin: 70 ng/mL (ref 24–380)
Iron: 120 ug/dL (ref 50–180)
TIBC: 327 mcg/dL (calc) (ref 250–425)

## 2020-03-31 LAB — LIPID PANEL
Cholesterol: 126 mg/dL (ref <200)
HDL: 49 mg/dL (ref >=40)
LDL Cholesterol (Calc): 58 mg/dL (calc)
Non-HDL Cholesterol (Calc): 77 mg/dL (calc) (ref <130)
Total CHOL/HDL Ratio: 2.6 (calc) (ref <5.0)
Triglycerides: 104 mg/dL (ref <150)

## 2020-03-31 LAB — TSH: TSH: 2.34 mIU/L (ref 0.40–4.50)

## 2020-03-31 LAB — MAGNESIUM: Magnesium: 2.1 mg/dL (ref 1.5–2.5)

## 2020-05-31 ENCOUNTER — Encounter: Payer: Medicare Other | Admitting: Internal Medicine

## 2020-06-09 ENCOUNTER — Encounter: Payer: Self-pay | Admitting: Internal Medicine

## 2020-06-09 NOTE — Patient Instructions (Signed)

## 2020-06-09 NOTE — Progress Notes (Signed)
Annual  Screening/Preventative Visit  & Comprehensive Evaluation & Examination      This very nice 70 y.o.  [name] presents for a Screening /Preventative Visit & comprehensive evaluation and management of multiple medical co-morbidities.  Patient has been followed for HTN, HLD, Prediabetes and Vitamin D Deficiency.     Patient is followed expectantly for Labile HTN . Patient's BP has been controlled at home.  Today's BP is at goal - 24/80. Patient denies any cardiac symptoms as chest pain, palpitations, shortness of breath, dizziness or ankle swelling.      Patient's hyperlipidemia is controlled with diet and medications. Patient denies myalgias or other medication SE's. Last lipids were at goal:  Lab Results  Component Value Date   CHOL 126 03/30/2020   HDL 49 03/30/2020   LDLCALC 58 03/30/2020   TRIG 104 03/30/2020   CHOLHDL 2.6 03/30/2020        Patient is monitored expectantly for glucose intolerance  and patient denies reactive hypoglycemic symptoms, visual blurring, diabetic polys or paresthesias. Last A1c was normal & at goal:   Lab Results  Component Value Date   HGBA1C 5.6 12/02/2019         Finally, patient has history of Vitamin D Deficiency of ("29"/2017)and last vitamin D was at goal:   Component Value Date   VD25OH 87 12/02/2019     Current Outpatient Medications on File Prior to Visit  Medication Sig  . aspirin EC 81 MG tablet Take daily.  Marland Kitchen VITAMIN D3 5000 units  Takes 2 10,000 Units daily  . VIT B-12 1000 mcg/ml injec Inject  once a month  . ezetimibe  10 MG tablet Take 1 tablet daily for Cholesterol  . FLAXSEED OIL 1000 MG  Take 2 capsules  daily.  . meclizine  25 MG tablet Take 1/2 to 1 tablet 2 to 3 x /day as needed   . meloxicam  15 MG tablet Take 1/2 to 1 tablet daily   . Omega-3 FISH OIL 1000 MG  Takes 1 capsule daily  . OTC allergy tablet  1 tablet daily.  . rosuvastatin  20 MG tablet Take 1 tablet 5 days a week for Cholesterol  . TURMERIC   Take by mouth daily.  Marland Kitchen venlafaxine-XR) 75 MG  TAKE 1 CAPSULE BY MOUTH DAILY FOR MOOD     Allergies  Allergen Reactions  . Other     Bee stings    Past Medical History:  Diagnosis Date  . GERD (gastroesophageal reflux disease)   . Hypercholesterolemia   . Hypertension    labile   . Plantar fasciitis, right 12/07/2016  . Vitamin D deficiency    Health Maintenance  Topic Date Due  . COVID-19 Vaccine (4 - Booster for Pfizer series) 08/15/2020  . COLONOSCOPY  11/15/2025  . TETANUS/TDAP  01/04/2027  . Hepatitis C Screening  Completed  . PNA vac Low Risk Adult  Completed  . INFLUENZA VACCINE  Discontinued   Immunization History  Administered Date(s) Administered  . PFIZER SARS-COV-2 Vaccination 05/24/2019, 06/19/2019, 02/16/2020  . Pneumococcal Conjugate-13 04/29/2018  . Pneumococcal Polysaccharide-23 08/18/2019  . Td 01/03/2017   Last Colon -  11/16/2015 in [place] & was recommended 10 yr f/u - due Aug 2027  Past Surgical History:  Procedure Laterality Date  . COLONOSCOPY N/A 5400,8676  . ROTATOR CUFF REPAIR Right 2015    Social History   Socioeconomic History  . Marital status: Married    Spouse name: Neoma Laming  .  Number of children: 2 children  Occupational History  . Not on file  Tobacco Use  . Smoking status: Never Smoker  . Smokeless tobacco: Never Used  Substance and Sexual Activity  . Alcohol use: Yes    Comment: drinks 2-3 beers a week  . Drug use: No  . Sexual activity: Not on file    ROS Constitutional: Denies fever, chills, weight loss/gain, headaches, insomnia,  night sweats or change in appetite. Does c/o fatigue. Eyes: Denies redness, blurred vision, diplopia, discharge, itchy or watery eyes.  ENT: Denies discharge, congestion, post nasal drip, epistaxis, sore throat, earache, hearing loss, dental pain, Tinnitus, Vertigo, Sinus pain or snoring.  Cardio: Denies chest pain, palpitations, irregular heartbeat, syncope, dyspnea, diaphoresis,  orthopnea, PND, claudication or edema Respiratory: denies cough, dyspnea, DOE, pleurisy, hoarseness, laryngitis or wheezing.  Gastrointestinal: Denies dysphagia, heartburn, reflux, water brash, pain, cramps, nausea, vomiting, bloating, diarrhea, constipation, hematemesis, melena, hematochezia, jaundice or hemorrhoids Genitourinary: Denies dysuria, frequency, discharge, hematuria or flank pain. Has urgency, nocturia x 2-3 & occasional hesitancy. Musculoskeletal: Denies arthralgia, myalgia, stiffness, Jt. Swelling, pain, limp or strain/sprain. Denies Falls. Skin: Denies puritis, rash, hives, warts, acne, eczema or change in skin lesion Neuro: No weakness, tremor, incoordination, spasms, paresthesia or pain Psychiatric: Denies confusion, memory loss or sensory loss. Denies Depression. Endocrine: Denies change in weight, skin, hair change, nocturia, and paresthesia, diabetic polys, visual blurring or hyper / hypo glycemic episodes.  Heme/Lymph: No excessive bleeding, bruising or enlarged lymph nodes.  Physical Exam  BP 124/80   Pulse 68   Temp (!) 97.2 F (36.2 C)   Resp 16   Ht 5\' 7"  (1.702 m)   Wt 163 lb (73.9 kg)   SpO2 99%   BMI 25.53 kg/m   General Appearance: Well nourished and well groomed and in no apparent distress.  Eyes: PERRLA, EOMs, conjunctiva no swelling or erythema, normal fundi and vessels. Sinuses: No frontal/maxillary tenderness ENT/Mouth: EACs patent / TMs  nl. Nares clear without erythema, swelling, mucoid exudates. Oral hygiene is good. No erythema, swelling, or exudate. Tongue normal, non-obstructing. Tonsils not swollen or erythematous. Hearing normal.  Neck: Supple, thyroid not palpable. No bruits, nodes or JVD. Respiratory: Respiratory effort normal.  BS equal and clear bilateral without rales, rhonci, wheezing or stridor. Cardio: Heart sounds are normal with regular rate and rhythm and no murmurs, rubs or gallops. Peripheral pulses are normal and equal  bilaterally without edema. No aortic or femoral bruits. Chest: symmetric with normal excursions and percussion.  Abdomen: Soft, with Nl bowel sounds. Nontender, no guarding, rebound, hernias, masses, or organomegaly.  Lymphatics: Non tender without lymphadenopathy.  Musculoskeletal: Full ROM all peripheral extremities, joint stability, 5/5 strength, and normal gait. Skin: Warm and dry without rashes, lesions, cyanosis, clubbing or  ecchymosis.  Neuro: Cranial nerves intact, reflexes equal bilaterally. Normal muscle tone, no cerebellar symptoms. Sensation intact.  Pysch: Alert and oriented X 3 with normal affect, insight and judgment appropriate.   Assessment and Plan  1. Labile hypertension  - EKG 12-Lead - [place], RETROPERITNL ABD,  LTD - Urinalysis, Routine w reflex microscopic - Microalbumin / creatinine urine ratio - CBC with Differential/Platelet - COMPLETE METABOLIC PANEL WITH GFR - Magnesium - TSH  2. Hyperlipidemia, mixed  - EKG 12-Lead - [place], RETROPERITNL ABD,  LTD - Lipid panel - TSH  3. Abnormal glucose  - EKG 12-Lead - [place], RETROPERITNL ABD,  LTD - Hemoglobin A1c - Insulin, random  4. Vitamin D deficiency  - VITAMIN D 25  Hydroxy  5. Screening for colorectal cancer  - POC Hemoccult Bld/Stl  6. BPH with obstruction/lower urinary tract symptoms  - PSA  7. Prostate cancer screening  - PSA  8. Screening for ischemic heart disease  - EKG 12-Lead  9. FH: hypertension  - EKG 12-Lead - [place], RETROPERITNL ABD,  LTD  10. Screening for AAA (aortic abdominal aneurysm)  - [place], RETROPERITNL ABD,  LTD  11. Medication management  - Urinalysis, Routine w reflex microscopic           Patient was counseled in prudent diet, weight control to achieve/maintain BMI less than 25, BP monitoring, regular exercise and medications as discussed.  Discussed med effects and SE's. Routine screening labs and tests as requested with regular follow-up as recommended. Over 40  minutes of exam, counseling, chart review and high complex critical decision making was performed   Kirtland Bouchard, MD

## 2020-06-10 ENCOUNTER — Encounter: Payer: Medicare Other | Admitting: Adult Health

## 2020-06-10 ENCOUNTER — Other Ambulatory Visit: Payer: Self-pay

## 2020-06-10 ENCOUNTER — Ambulatory Visit (INDEPENDENT_AMBULATORY_CARE_PROVIDER_SITE_OTHER): Payer: Medicare Other | Admitting: Internal Medicine

## 2020-06-10 VITALS — BP 124/80 | HR 68 | Temp 97.2°F | Resp 16 | Ht 67.0 in | Wt 163.0 lb

## 2020-06-10 DIAGNOSIS — Z136 Encounter for screening for cardiovascular disorders: Secondary | ICD-10-CM | POA: Diagnosis not present

## 2020-06-10 DIAGNOSIS — Z8249 Family history of ischemic heart disease and other diseases of the circulatory system: Secondary | ICD-10-CM

## 2020-06-10 DIAGNOSIS — Z125 Encounter for screening for malignant neoplasm of prostate: Secondary | ICD-10-CM

## 2020-06-10 DIAGNOSIS — Z1211 Encounter for screening for malignant neoplasm of colon: Secondary | ICD-10-CM

## 2020-06-10 DIAGNOSIS — R0989 Other specified symptoms and signs involving the circulatory and respiratory systems: Secondary | ICD-10-CM | POA: Diagnosis not present

## 2020-06-10 DIAGNOSIS — N401 Enlarged prostate with lower urinary tract symptoms: Secondary | ICD-10-CM

## 2020-06-10 DIAGNOSIS — R7309 Other abnormal glucose: Secondary | ICD-10-CM | POA: Diagnosis not present

## 2020-06-10 DIAGNOSIS — Z79899 Other long term (current) drug therapy: Secondary | ICD-10-CM | POA: Diagnosis not present

## 2020-06-10 DIAGNOSIS — E559 Vitamin D deficiency, unspecified: Secondary | ICD-10-CM | POA: Diagnosis not present

## 2020-06-10 DIAGNOSIS — Z1212 Encounter for screening for malignant neoplasm of rectum: Secondary | ICD-10-CM | POA: Diagnosis not present

## 2020-06-10 DIAGNOSIS — E782 Mixed hyperlipidemia: Secondary | ICD-10-CM

## 2020-06-10 DIAGNOSIS — N138 Other obstructive and reflux uropathy: Secondary | ICD-10-CM

## 2020-06-10 NOTE — Progress Notes (Signed)
AortaScan < 3 mc. Within normal limits, per Dr McKeown. 

## 2020-06-11 LAB — CBC WITH DIFFERENTIAL/PLATELET
Absolute Monocytes: 426 cells/uL (ref 200–950)
Basophils Absolute: 59 cells/uL (ref 0–200)
Basophils Relative: 1.2 %
Eosinophils Absolute: 172 cells/uL (ref 15–500)
Eosinophils Relative: 3.5 %
HCT: 43.5 % (ref 38.5–50.0)
Hemoglobin: 14.4 g/dL (ref 13.2–17.1)
Lymphs Abs: 1294 cells/uL (ref 850–3900)
MCH: 29.4 pg (ref 27.0–33.0)
MCHC: 33.1 g/dL (ref 32.0–36.0)
MCV: 88.8 fL (ref 80.0–100.0)
MPV: 9.7 fL (ref 7.5–12.5)
Monocytes Relative: 8.7 %
Neutro Abs: 2950 cells/uL (ref 1500–7800)
Neutrophils Relative %: 60.2 %
Platelets: 263 10*3/uL (ref 140–400)
RBC: 4.9 10*6/uL (ref 4.20–5.80)
RDW: 12.7 % (ref 11.0–15.0)
Total Lymphocyte: 26.4 %
WBC: 4.9 10*3/uL (ref 3.8–10.8)

## 2020-06-11 LAB — COMPLETE METABOLIC PANEL WITH GFR
AG Ratio: 1.8 (calc) (ref 1.0–2.5)
ALT: 24 U/L (ref 9–46)
AST: 22 U/L (ref 10–35)
Albumin: 4.6 g/dL (ref 3.6–5.1)
Alkaline phosphatase (APISO): 45 U/L (ref 35–144)
BUN: 21 mg/dL (ref 7–25)
CO2: 32 mmol/L (ref 20–32)
Calcium: 10.1 mg/dL (ref 8.6–10.3)
Chloride: 104 mmol/L (ref 98–110)
Creat: 1.06 mg/dL (ref 0.70–1.18)
GFR, Est African American: 82 mL/min/{1.73_m2} (ref >=60)
GFR, Est Non African American: 71 mL/min/{1.73_m2} (ref >=60)
Globulin: 2.5 g/dL (calc) (ref 1.9–3.7)
Glucose, Bld: 71 mg/dL (ref 65–99)
Potassium: 4.2 mmol/L (ref 3.5–5.3)
Sodium: 140 mmol/L (ref 135–146)
Total Bilirubin: 0.8 mg/dL (ref 0.2–1.2)
Total Protein: 7.1 g/dL (ref 6.1–8.1)

## 2020-06-11 LAB — URINALYSIS, ROUTINE W REFLEX MICROSCOPIC
Bilirubin Urine: NEGATIVE
Glucose, UA: NEGATIVE
Hgb urine dipstick: NEGATIVE
Ketones, ur: NEGATIVE
Leukocytes,Ua: NEGATIVE
Nitrite: NEGATIVE
Protein, ur: NEGATIVE
Specific Gravity, Urine: 1.005 (ref 1.001–1.03)
pH: 7 (ref 5.0–8.0)

## 2020-06-11 LAB — LIPID PANEL
Cholesterol: 148 mg/dL (ref <200)
HDL: 45 mg/dL (ref >=40)
LDL Cholesterol (Calc): 81 mg/dL (calc)
Non-HDL Cholesterol (Calc): 103 mg/dL (calc) (ref <130)
Total CHOL/HDL Ratio: 3.3 (calc) (ref <5.0)
Triglycerides: 127 mg/dL (ref <150)

## 2020-06-11 LAB — VITAMIN D 25 HYDROXY (VIT D DEFICIENCY, FRACTURES): Vit D, 25-Hydroxy: 80 ng/mL (ref 30–100)

## 2020-06-11 LAB — HEMOGLOBIN A1C
Hgb A1c MFr Bld: 5.7 % of total Hgb — ABNORMAL HIGH (ref <5.7)
Mean Plasma Glucose: 117 mg/dL
eAG (mmol/L): 6.5 mmol/L

## 2020-06-11 LAB — MICROALBUMIN / CREATININE URINE RATIO
Creatinine, Urine: 23 mg/dL (ref 20–320)
Microalb, Ur: <0.2 mg/dL

## 2020-06-11 LAB — PSA: PSA: 0.26 ng/mL (ref <=4.0)

## 2020-06-11 LAB — MAGNESIUM: Magnesium: 2.2 mg/dL (ref 1.5–2.5)

## 2020-06-11 LAB — INSULIN, RANDOM: Insulin: 6.2 u[IU]/mL

## 2020-06-11 LAB — TSH: TSH: 2.46 mIU/L (ref 0.40–4.50)

## 2020-06-11 NOTE — Progress Notes (Signed)
==========================================================  -   Test results slightly outside the reference range are not unusual.  If there is anything important, I will review this with you,  otherwise it is considered normal test values.  If you have further questions,  please do not hesitate to contact me at the office or via My Chart.  ==========================================================  ==========================================================   - Total Chol = 148 and LDL Chol = 81 - Both Excellent   - Very low risk for Heart Attack / Stroke  ========================================================   - A1c - OK - No Diabetes - Great !  ==========================================================  ==========================================================   - Vitamin D = 80 - Excellent  ==========================================================  ==========================================================   - PSA - Very Low - Great  ==========================================================  ==========================================================   - All Else - CBC - Kidneys - Electrolytes - Liver - Magnesium & Thyroid    - all  Normal / OK ===========================================================  ===========================================================  - Keep up the [place] Work  ! =========================================================== ===========================================================

## 2020-06-18 ENCOUNTER — Other Ambulatory Visit: Payer: Self-pay | Admitting: Internal Medicine

## 2020-06-18 DIAGNOSIS — E782 Mixed hyperlipidemia: Secondary | ICD-10-CM

## 2020-07-13 ENCOUNTER — Other Ambulatory Visit: Payer: Self-pay

## 2020-07-13 DIAGNOSIS — Z1211 Encounter for screening for malignant neoplasm of colon: Secondary | ICD-10-CM

## 2020-07-13 LAB — POC HEMOCCULT BLD/STL (HOME/3-CARD/SCREEN)
Card #2 Fecal Occult Blod, POC: NEGATIVE
Card #3 Fecal Occult Blood, POC: NEGATIVE
Fecal Occult Blood, POC: NEGATIVE

## 2020-07-14 DIAGNOSIS — Z1212 Encounter for screening for malignant neoplasm of rectum: Secondary | ICD-10-CM | POA: Diagnosis not present

## 2020-07-14 DIAGNOSIS — Z1211 Encounter for screening for malignant neoplasm of colon: Secondary | ICD-10-CM | POA: Diagnosis not present

## 2020-07-18 ENCOUNTER — Other Ambulatory Visit: Payer: Self-pay | Admitting: Internal Medicine

## 2020-07-18 DIAGNOSIS — E782 Mixed hyperlipidemia: Secondary | ICD-10-CM

## 2020-08-30 ENCOUNTER — Ambulatory Visit: Payer: Medicare Other | Admitting: Adult Health

## 2020-08-31 ENCOUNTER — Other Ambulatory Visit: Payer: Self-pay | Admitting: Internal Medicine

## 2020-08-31 DIAGNOSIS — M2550 Pain in unspecified joint: Secondary | ICD-10-CM

## 2020-08-31 MED ORDER — MELOXICAM 15 MG PO TABS: ORAL_TABLET | ORAL | 3 refills | Status: DC

## 2020-09-07 NOTE — Progress Notes (Signed)
MEDICARE ANNUAL WELLNESS VISIT AND FOLLOW UP Assessment:   Diagnoses and all orders for this visit:  Encounter for Medicare annual wellness exam Due annually   Labile hypertension Currently at goal without medications Monitor blood pressure at home; call if consistently over 130/80 Continue DASH diet.   Reminder to go to the ER if any CP, SOB, nausea, dizziness, severe HA, changes vision/speech, left arm numbness and tingling and jaw pain.  Mixed hyperlipidemia Continue medications: ? Cramps with rosuvastatin - try stopping all meds x 1 month, then if no changes try rosuvastatin 20 mg daily, stop zetia to reduce pill burden Continue low cholesterol diet and exercise.  Check lipid panel.   Other abnormal glucose (prediabetes) Discussed disease and risks Discussed diet/exercise, weight management  Monitor A1C q39m, defer today  Allergic state, sequela Refilled epi pen for bee stings; reminded to follow up at ER   BMI 25, adult Continue to recommend diet heavy in fruits and veggies and low in animal meats, cheeses, and dairy products, appropriate calorie intake Discuss exercise recommendations routinely Continue to monitor weight at each visit - set goal to lose 10 lb this summer  Medication management Check CBC, CMP/GFR  B12 deficiency Continue supplement; check levels after switches to SL formulation  Numbness/tingling bilateral lower extremities Mild; patient declines meds or further workup  Major depression in remission ([place]) In remission on medication  Lifestyle discussed: diet/exerise, sleep hygiene, stress management, hydration  Vitamin D deficiency At goal at recent check; continue to recommend supplementation for goal of 70-100 Defer vitamin D level  Tick bites - [place] antibodies  - tick prevention information provided  Over 30 minutes of exam, counseling, chart review, and critical decision making was performed  Future Appointments  Date Time  Provider [place]  12/13/2020  9:30 AM Unk Pinto, MD [facility] None  06/20/2021  2:00 PM Unk Pinto, MD [facility] None  09/08/2021 11:30 AM Liane Comber, NP [facility] None     Plan:   During the course of the visit the patient was educated and counseled about appropriate screening and preventive services including:    Pneumococcal vaccine   Influenza vaccine  Prevnar 13  Td vaccine  Screening electrocardiogram  Colorectal cancer screening  Diabetes screening  Glaucoma screening  Nutrition counseling    Subjective:  Francisco Dixon is a 70 y.o. male who presents for Medicare Annual Wellness Visit and 3 month follow up for HTN, hyperlipidemia, prediabetes, and vitamin D Def.   Reports several deer tick bites in recent months, does have gernal aches, unchanged/chronic, denies rash, fever/chills, confusion/memory changes, paresthesias, swollen glands, HA. Requesting Lyme's check.   he has a diagnosis of depression and has been in remission on effexor 75 mg daily.   He has persistent numbness/tingling of bilateral feet, not significantly limiting him. Questionably has been attributed to DDD seen on xray but he declines interventions or work up, managing with exercise and stretching, occasional NSAID/meloxicam. He has had 3-4 mechanical falls, without injury in the last year.   BMI is Body mass index is 25.84 kg/m., he has been working on diet and exercise (30 min exercise tape), very active building deck, installing kitchen cabinets, landscaping, etc, minimal sitting during the day.  Wt Readings from Last 3 Encounters:  09/08/20 165 lb (74.8 kg)  06/10/20 163 lb (73.9 kg)  03/30/20 160 lb 12.8 oz (72.9 kg)   His blood pressure has been controlled at home, today their BP is BP: 130/78 He does workout. He  denies chest pain, shortness of breath, dizziness.   He is on cholesterol medication (crestor 20 mg every other day, zetia 10 mg daily, denies hx  of intolerance) and denies myalgias. His cholesterol is at goal. The cholesterol last visit was:   Lab Results  Component Value Date   CHOL 148 06/10/2020   HDL 45 06/10/2020   LDLCALC 81 06/10/2020   TRIG 127 06/10/2020   CHOLHDL 3.3 06/10/2020   He has been working on diet and exercise for prediabetes, and denies foot ulcerations, increased appetite, nausea, paresthesia of the feet, polydipsia, polyuria, visual disturbances, vomiting and weight loss. Last A1C in the office was:  Lab Results  Component Value Date   HGBA1C 5.7 (H) 06/10/2020   Last GFR Lab Results  Component Value Date   GFRNONAA 71 06/10/2020    Patient is on Vitamin D supplement and at goal at recent check:   Lab Results  Component Value Date   VD25OH 25 06/10/2020     He was on monthly B12 injections due to history of deficiency, planning to switch to SL after he runs out of current script.  Lab Results  Component Value Date   [id number] 989 12/02/2019     Medication Review: Current Outpatient Medications on File Prior to Visit  Medication Sig Dispense Refill  . aspirin EC 81 MG tablet Take 81 mg by mouth daily.    . Cholecalciferol (VITAMIN D3) 5000 units CAPS Takes 2 caps (10,000 Units) daily    . cyanocobalamin (,VITAMIN B-12,) 1000 MCG/ML injection Inject 1058mcg once weekly for 8 weeks, then once a month 30 mL 2  . Flaxseed, Linseed, (FLAXSEED OIL) 1000 MG CAPS Take 2 capsules by mouth daily.    . meclizine (ANTIVERT) 25 MG tablet Take 1/2 to 1 tablet 2 to 3 x /day as needed for Dizziness / Vertigo 90 tablet 11  . meloxicam (MOBIC) 15 MG tablet Take  1/2 to 1 tablet  Daily with food for Pain & Inflammation - try limit to 5 days /week to avoid kidney damage 90 tablet 3  . NEEDLE, DISP, 25 G (BD SAFETYGLIDE SHIELDED NEEDLE) 25G X 1" MISC Use for B12 injections 50 each 0  . Omega-3 Fatty Acids (FISH OIL) 1000 MG CAPS Takes 1 capsule daily  0  . OVER THE COUNTER MEDICATION OTC allergy tablet 1 tablet  daily.    . SYRINGE-NEEDLE, DISP, 3 ML (BD SAFETYGLIDE SYRINGE/NEEDLE) 25G X 1" 3 ML MISC Use for B12 injections. 50 each 0  . SYRINGE/NEEDLE, DISP, 1 ML (B-D SYRINGE/NEEDLE 1CC/25GX5/8) 25G X 5/8" 1 ML MISC 1 SQ injection once weekly 100 each 1  . TURMERIC PO Take by mouth daily.    Marland Kitchen venlafaxine XR (EFFEXOR-XR) 75 MG 24 hr capsule TAKE 1 CAPSULE BY MOUTH DAILY FOR MOOD 90 capsule 3   No current facility-administered medications on file prior to visit.    Allergies: Allergies  Allergen Reactions  . Other     Bee stings    Current Problems (verified) has Labile hypertension; Hyperlipidemia, mixed; Vitamin D deficiency; Medication management; Abnormal glucose; Allergy; Major depression in full remission ([place]); Numbness and tingling of both feet; and B12 deficiency on their problem list.  Screening Tests Immunization History  Administered Date(s) Administered  . PFIZER(Purple Top)SARS-COV-2 Vaccination 05/24/2019, 06/19/2019, 02/16/2020  . Pneumococcal Conjugate-13 04/29/2018  . Pneumococcal Polysaccharide-23 08/18/2019  . Td 01/03/2017    Preventative care: Last colonoscopy:  August 2017, 10 year  Prior vaccinations: TD or Tdap:  01/2017  Influenza: declines Pneumococcal: 2021 Prevnar13: 04/2018 Shingles/Zostavax: declines Covid 19: 2/2, 2021, booster x1  Names of Other Physician/Practitioners you currently use: 1. [facility] here for primary care 2. Dr. Zenia Resides, eye doctor, last visit 2021, wears glasses 3.  Dr. ?, new dentist, last visit 2021, goes q8m, has upcoming   Patient Care Team: Unk Pinto, MD as PCP - General (Internal Medicine)  Surgical: He  has a past surgical history that includes Colonoscopy (N/A, 2002,2007,2012) and Rotator cuff repair (Right, 2015). Family His family history is not on file. Social history  He reports that he has never smoked. He has never used smokeless tobacco. He reports current alcohol  use. He reports that he does not use drugs.  MEDICARE WELLNESS OBJECTIVES: Physical activity: Current Exercise Habits: Home exercise routine, Type of exercise: walking;yoga, Time (Minutes): 30, Frequency (Times/Week): 4, Weekly Exercise (Minutes/Week): 120, Intensity: Mild Cardiac risk factors: Cardiac Risk Factors include: advanced age (>90men, >24 women);dyslipidemia;hypertension;male gender Depression/mood screen:   Depression screen [facility] 2/9 09/08/2020  Decreased Interest 0  Down, Depressed, Hopeless 0  PHQ - 2 Score 0  Altered sleeping -  Tired, decreased energy -  Change in appetite -  Feeling bad or failure about yourself  -  Trouble concentrating -  Moving slowly or fidgety/restless -  Suicidal thoughts -  PHQ-9 Score -  Difficult doing work/chores -    ADLs:  In your present state of health, do you have any difficulty performing the following activities: 09/08/2020 06/09/2020  Hearing? Y N  Comment has bil hearing aids that help -  Vision? N N  Difficulty concentrating or making decisions? N N  Walking or climbing stairs? N N  Dressing or bathing? N N  Doing errands, shopping? N N  Some recent data might be hidden     Cognitive Testing  Alert? Yes  Normal Appearance?Yes  Oriented to person? Yes  Place? Yes   Time? Yes  Recall of three objects?  Yes  Can perform simple calculations? Yes  Displays appropriate judgment?Yes  Can read the correct time from a watch face?Yes  EOL planning: Does Patient Have a Medical Advance Directive?: Yes Type of Advance Directive: [place] will Does patient want to make changes to medical advance directive?: No - Patient declined Copy of [place] in Chart?: Yes - validated most recent copy scanned in chart (See row information)   Objective:   Today's Vitals   09/08/20 1137 09/08/20 1214  BP: 140/78 130/78  Pulse: (!) 44   Temp: (!) 96.8 F (36 C)   SpO2: 97%   Weight: 165 lb (74.8  kg)    Body mass index is 25.84 kg/m.  General appearance: alert, no distress, WD/WN, male HEENT: normocephalic, sclerae anicteric, TMs pearly, nares patent, no discharge or erythema, pharynx normal. HOH with bilateral hearing aids.  Oral cavity: MMM, no lesions Neck: supple, no lymphadenopathy, no thyromegaly, no masses Heart: RRR, normal S1, S2, no murmurs Lungs: CTA bilaterally, no wheezes, rhonchi, or rales Abdomen: +bs, soft, non tender, non distended, no masses, no hepatomegaly, no splenomegaly Musculoskeletal: nontender, no swelling, no obvious deformity Extremities: no edema, no cyanosis, no clubbing Pulses: 2+ symmetric, upper and lower extremities, normal cap refill Neurological: alert, oriented x 3, CN2-12 intact, strength normal upper extremities and lower extremities, sensation diminished bil feet/toes, DTRs 2+ throughout, no cerebellar signs, gait normal Psychiatric: normal affect, behavior normal, pleasant   Medicare Attestation I have  personally reviewed: The patient's medical and social history Their use of alcohol, tobacco or illicit drugs Their current medications and supplements The patient's functional ability including ADLs,fall risks, home safety risks, cognitive, and hearing and visual impairment Diet and physical activities Evidence for depression or mood disorders  The patient's weight, height, BMI, and visual acuity have been recorded in the chart.  I have made referrals, counseling, and provided education to the patient based on review of the above and I have provided the patient with a written personalized care plan for preventive services.     Izora Ribas, NP   09/08/2020

## 2020-09-08 ENCOUNTER — Other Ambulatory Visit: Payer: Self-pay

## 2020-09-08 ENCOUNTER — Ambulatory Visit (INDEPENDENT_AMBULATORY_CARE_PROVIDER_SITE_OTHER): Payer: Medicare Other | Admitting: Adult Health

## 2020-09-08 ENCOUNTER — Encounter: Payer: Self-pay | Admitting: Adult Health

## 2020-09-08 VITALS — BP 130/78 | HR 44 | Temp 96.8°F | Wt 165.0 lb

## 2020-09-08 DIAGNOSIS — W57XXXA Bitten or stung by nonvenomous insect and other nonvenomous arthropods, initial encounter: Secondary | ICD-10-CM | POA: Diagnosis not present

## 2020-09-08 DIAGNOSIS — E538 Deficiency of other specified B group vitamins: Secondary | ICD-10-CM

## 2020-09-08 DIAGNOSIS — Z79899 Other long term (current) drug therapy: Secondary | ICD-10-CM

## 2020-09-08 DIAGNOSIS — R7309 Other abnormal glucose: Secondary | ICD-10-CM

## 2020-09-08 DIAGNOSIS — R0989 Other specified symptoms and signs involving the circulatory and respiratory systems: Secondary | ICD-10-CM

## 2020-09-08 DIAGNOSIS — Z6825 Body mass index (BMI) 25.0-25.9, adult: Secondary | ICD-10-CM

## 2020-09-08 DIAGNOSIS — Z0001 Encounter for general adult medical examination with abnormal findings: Secondary | ICD-10-CM

## 2020-09-08 DIAGNOSIS — R6889 Other general symptoms and signs: Secondary | ICD-10-CM

## 2020-09-08 DIAGNOSIS — E559 Vitamin D deficiency, unspecified: Secondary | ICD-10-CM

## 2020-09-08 DIAGNOSIS — E782 Mixed hyperlipidemia: Secondary | ICD-10-CM | POA: Diagnosis not present

## 2020-09-08 DIAGNOSIS — R200 Anesthesia of skin: Secondary | ICD-10-CM | POA: Diagnosis not present

## 2020-09-08 DIAGNOSIS — R202 Paresthesia of skin: Secondary | ICD-10-CM

## 2020-09-08 DIAGNOSIS — F3342 Major depressive disorder, recurrent, in full remission: Secondary | ICD-10-CM

## 2020-09-08 DIAGNOSIS — T7840XS Allergy, unspecified, sequela: Secondary | ICD-10-CM

## 2020-09-08 DIAGNOSIS — Z0000 Encounter for general adult medical examination without abnormal findings: Secondary | ICD-10-CM

## 2020-09-08 MED ORDER — ROSUVASTATIN CALCIUM 20 MG PO TABS: ORAL_TABLET | ORAL | 3 refills | Status: DC

## 2020-09-08 MED ORDER — EPINEPHRINE 0.3 MG/0.3ML IJ SOAJ: 0.3000 mg | Freq: Once | INTRAMUSCULAR | 1 refills | Status: AC

## 2020-09-08 NOTE — Patient Instructions (Addendum)
Francisco Dixon , Thank you for taking time to come for your Medicare Wellness Visit. I appreciate your ongoing commitment to your health goals. Please review the following plan we discussed and let me know if I can assist you in the future.   These are the goals we discussed: Goals    . Exercise 150 min/wk Moderate Activity    . HEMOGLOBIN A1C < 5.7    . LDL CALC < 100       This is a list of the screening recommended for you and due dates:  Health Maintenance  Topic Date Due  . COVID-19 Vaccine (4 - Booster for Pfizer series) 05/18/2020  . Colon Cancer Screening  11/15/2025  . Tetanus Vaccine  01/04/2027  . Hepatitis C Screening: USPSTF Recommendation to screen - Ages 54-79 yo.  Completed  . Pneumonia vaccines  Completed  . HPV Vaccine  Aged Out  . Flu Shot  Discontinued    Try stopping both cholesterol medications -  If no changes in cramps/aches - try rosuvastatin 20 mg daily, stay off of zetia       Tick Bite Information, Adult  Ticks are insects that can bite. Most ticks live in shrubs and grassy areas. They climb onto people and animals that go by. Then they bite. Some ticks carry germs that can make you sick. How can I prevent tick bites? Take these steps: Use insect repellent  Use an insect repellent that has 20% or higher of the ingredients DEET, picaridin, or IR3535. Follow the instructions on the label. Put it on: ? Bare skin. ? The tops of your boots. ? Your pant legs. ? The ends of your sleeves.  If you use an insect repellent that has the ingredient permethrin, follow the instructions on the label. Put it on: ? Clothing. ? Boots. ? Supplies or outdoor gear. ? Tents. When you are outside  Wear long sleeves and long pants.  Wear light-colored clothes.  Tuck your pant legs into your socks.  Stay in the middle of the trail. Do not touch the bushes.  Avoid walking through long grass.  Check for ticks on your clothes, hair, and skin often while you  are outside. Before going inside your house, check your clothes, skin, head, neck, armpits, waist, groin, and joint areas. When you go indoors  Check your clothes for ticks. Dry your clothes in a dryer on high heat for 10 minutes or more. If clothes are damp, additional time may be needed.  Wash your clothes right away if they need to be washed. Use hot water.  Check your pets and outdoor gear.  Shower right away.  Check your body for ticks. Do a full body check using a mirror. What is the right way to remove a tick? Remove the tick from your skin as soon as possible. Do not remove the tick with your bare fingers.  To remove a tick that is crawling on your skin: ? Go outdoors and brush the tick off. ? Use tape or a lint roller.  To remove a tick that is biting: 1. Wash your hands. 2. If you have latex gloves, put them on. 3. Use tweezers, curved forceps, or a tick-removal tool to grasp the tick. Grasp the tick as close to your skin and as close to the tick's head as possible. 4. Gently pull up until the tick lets go.  Try to keep the tick's head attached to its body.  Do not twist or jerk  the tick.  Do not squeeze or crush the tick. Do not try to remove a tick with heat, alcohol, petroleum jelly, or fingernail polish.   What should I do after taking out a tick?  Throw away the tick. Do not crush a tick with your fingers.  Clean the bite area and your hands with soap and water, rubbing alcohol, or an iodine wash.  If an antiseptic cream or ointment is available, apply a small amount to the bite area.  Wash and disinfect any instruments that you used to remove the tick. How should I get rid of a live tick? To dispose of a live tick, use one of these methods:  Place the tick in rubbing alcohol.  Place the tick in a bag or container you can close tightly.  Wrap the tick tightly in tape.  Flush the tick down the toilet. Contact a doctor if:  You have symptoms, such  as: ? A fever or chills. ? A red rash that makes a circle (bull's-eye rash) in the bite area. ? Redness and swelling where the tick bit you. ? Headache. ? Pain in a muscle, joint, or bone. ? Being more tired than normal. ? Trouble walking or moving your legs. ? Numbness in your legs. ? Tender and swollen lymph glands.  A part of a tick breaks off and gets stuck in your skin. Get help right away if:  You cannot remove a tick.  You cannot move (have paralysis) or feel weak.  You are feeling worse or have new symptoms.  You find a tick that is biting you and filled with blood. This is important if you are in an area where diseases from ticks are common. Summary  Ticks may carry germs that can make you sick.  To prevent tick bites wear long sleeves, long pants, and light colors. Use insect repellent. Follow the instructions on the label.  If the tick is biting, do not try to remove it with heat, alcohol, petroleum jelly, or fingernail polish.  Use tweezers, curved forceps, or a tick-removal tool to grasp the tick. Gently pull up until the tick lets go. Do not twist or jerk the tick. Do not squeeze or crush the tick.  If you have symptoms, contact a doctor. This information is not intended to replace advice given to you by your health care provider. Make sure you discuss any questions you have with your health care provider. Document Revised: 03/31/2019 Document Reviewed: 03/31/2019 Elsevier Patient Education  2021 [employer].

## 2020-09-09 LAB — COMPLETE METABOLIC PANEL WITH GFR
AG Ratio: 1.7 (calc) (ref 1.0–2.5)
ALT: 26 U/L (ref 9–46)
AST: 21 U/L (ref 10–35)
Albumin: 4.6 g/dL (ref 3.6–5.1)
Alkaline phosphatase (APISO): 45 U/L (ref 35–144)
BUN: 19 mg/dL (ref 7–25)
CO2: 30 mmol/L (ref 20–32)
Calcium: 9.8 mg/dL (ref 8.6–10.3)
Chloride: 105 mmol/L (ref 98–110)
Creat: 0.93 mg/dL (ref 0.70–1.18)
GFR, Est African American: 96 mL/min/{1.73_m2} (ref >=60)
GFR, Est Non African American: 83 mL/min/{1.73_m2} (ref >=60)
Globulin: 2.7 g/dL (calc) (ref 1.9–3.7)
Glucose, Bld: 87 mg/dL (ref 65–99)
Potassium: 4.3 mmol/L (ref 3.5–5.3)
Sodium: 141 mmol/L (ref 135–146)
Total Bilirubin: 0.6 mg/dL (ref 0.2–1.2)
Total Protein: 7.3 g/dL (ref 6.1–8.1)

## 2020-09-09 LAB — LIPID PANEL
Cholesterol: 129 mg/dL (ref <200)
HDL: 48 mg/dL (ref >=40)
LDL Cholesterol (Calc): 62 mg/dL (calc)
Non-HDL Cholesterol (Calc): 81 mg/dL (calc) (ref <130)
Total CHOL/HDL Ratio: 2.7 (calc) (ref <5.0)
Triglycerides: 104 mg/dL (ref <150)

## 2020-09-09 LAB — CBC WITH DIFFERENTIAL/PLATELET
Absolute Monocytes: 466 cells/uL (ref 200–950)
Basophils Absolute: 42 cells/uL (ref 0–200)
Basophils Relative: 0.8 %
Eosinophils Absolute: 239 cells/uL (ref 15–500)
Eosinophils Relative: 4.5 %
HCT: 42.2 % (ref 38.5–50.0)
Hemoglobin: 14.1 g/dL (ref 13.2–17.1)
Lymphs Abs: 1484 cells/uL (ref 850–3900)
MCH: 30.3 pg (ref 27.0–33.0)
MCHC: 33.4 g/dL (ref 32.0–36.0)
MCV: 90.8 fL (ref 80.0–100.0)
MPV: 10.2 fL (ref 7.5–12.5)
Monocytes Relative: 8.8 %
Neutro Abs: 3069 cells/uL (ref 1500–7800)
Neutrophils Relative %: 57.9 %
Platelets: 223 10*3/uL (ref 140–400)
RBC: 4.65 10*6/uL (ref 4.20–5.80)
RDW: 12.5 % (ref 11.0–15.0)
Total Lymphocyte: 28 %
WBC: 5.3 10*3/uL (ref 3.8–10.8)

## 2020-09-09 LAB — TSH: TSH: 2.36 mIU/L (ref 0.40–4.50)

## 2020-09-09 LAB — B. BURGDORFI ANTIBODIES: B burgdorferi Ab IgG+IgM: <0.9 index

## 2020-12-12 ENCOUNTER — Encounter: Payer: Self-pay | Admitting: Internal Medicine

## 2020-12-12 NOTE — Patient Instructions (Signed)

## 2020-12-12 NOTE — Progress Notes (Signed)
Future Appointments  Date Time Provider [place]  12/13/2020  9:30 AM Unk Pinto, MD [facility] None  06/20/2021  -  CPE   2:00 PM Unk Pinto, MD [facility] None  09/08/2021 - Wellness 11:30 AM Liane Comber, NP [facility] None    History of Present Illness:       This very nice 70 y.o. [name] presents for 6  month follow up with labile HTN, HLD, Pre-Diabetes and Vitamin D Deficiency.  Today patient also relates difficulty staying focused & concentrating. Relates easily becomes distracted while driving.        Patient is monitored for labile HTN & BP has been controlled at home. Today's BP is at goal - 125/79. Patient has had no complaints of any cardiac type chest pain, palpitations, dyspnea / orthopnea / PND, dizziness, claudication, or dependent edema.       Hyperlipidemia is controlled with diet & Rosuvastatin. Patient denies myalgias or other med SE's. Last Lipids were at goal:  Lab Results  Component Value Date   CHOL 129 09/08/2020   HDL 48 09/08/2020   LDLCALC 62 09/08/2020   TRIG 104 09/08/2020   CHOLHDL 2.7 09/08/2020     Also, the patient has history of abnormal glucose & is monitored for glucose intolerance.  Patient has had no symptoms of reactive hypoglycemia, diabetic polys, paresthesias or visual blurring.  Last A1c was near goal:  Lab Results  Component Value Date   HGBA1C 5.7 (H) 06/10/2020        Further, the patient also has history of Vitamin D Deficiency ("29" /2017) and supplements vitamin D without any suspected side-effects. Last vitamin D was at goal:  Lab Results  Component Value Date   VD25OH 18 06/10/2020     Current Outpatient Medications on File Prior to Visit  Medication Sig   aspirin EC 81 MG tablet Take  daily.   VITAMIN D 5000 units CAPS Takes 2 capsdaily   VITAMIN B-12 1000 MCG/ML injection Inject 1035mg once a month   FLAXSEED OIL 1000 MG CAPS Take 2 capsules daily.   meclizine  25 MG tablet Take 1/2 to  1 tablet 2 to 3 x /day as needed f   meloxicam 15 MG tablet Take  1/2 to 1 tablet  Daily     Omega-3 FISH OIL 1000 MG CAPS Takes 1 capsule daily   OTC allergy tablet  1 tablet daily.   rosuvastatin 20 MG tablet Take 1 tablet  every day  for Cholesterol   TURMERIC  Take  daily.   venlafaxine -XR 75 MG 24 hr capsule TAKE 1 CAPSULE DAILY FOR MOOD      Allergies  Allergen Reactions   Other     Bee stings     PMHx:   Past Medical History:  Diagnosis Date   GERD (gastroesophageal reflux disease)    Hypercholesterolemia    Hypertension    labile    Plantar fasciitis, right 12/07/2016   Vitamin D deficiency      Immunization History  Administered Date(s) Administered   PFIZER SARS-COV-2 Vaccination 05/24/2019, 06/19/2019, 02/16/2020   Pneumococcal -13 04/29/2018   Pneumococcal -23 08/18/2019   Td 01/03/2017     Past Surgical History:  Procedure Laterality Date   COLONOSCOPY N/A 2002,2007,2012   ROTATOR CUFF REPAIR Right 2015    FHx:    Reviewed / unchanged  SHx:    Reviewed / unchanged   Systems Review:  Constitutional: Denies fever,  chills, wt changes, headaches, insomnia, fatigue, night sweats, change in appetite. Eyes: Denies redness, blurred vision, diplopia, discharge, itchy, watery eyes.  ENT: Denies discharge, congestion, post nasal drip, epistaxis, sore throat, earache, hearing loss, dental pain, tinnitus, vertigo, sinus pain, snoring.  CV: Denies chest pain, palpitations, irregular heartbeat, syncope, dyspnea, diaphoresis, orthopnea, PND, claudication or edema. Respiratory: denies cough, dyspnea, DOE, pleurisy, hoarseness, laryngitis, wheezing.  Gastrointestinal: Denies dysphagia, odynophagia, heartburn, reflux, water brash, abdominal pain or cramps, nausea, vomiting, bloating, diarrhea, constipation, hematemesis, melena, hematochezia  or hemorrhoids. Genitourinary: Denies dysuria, frequency, urgency, nocturia, hesitancy, discharge, hematuria or flank  pain. Musculoskeletal: Denies arthralgias, myalgias, stiffness, jt. swelling, pain, limping or strain/sprain.  Skin: Denies pruritus, rash, hives, warts, acne, eczema or change in skin lesion(s). Neuro: No weakness, tremor, incoordination, spasms, paresthesia or pain. Psychiatric: Denies confusion, memory loss or sensory loss. Endo: Denies change in weight, skin or hair change.  Heme/Lymph: No excessive bleeding, bruising or enlarged lymph nodes.  Physical Exam  BP 125/79   Pulse 69   Temp 97.9 F (36.6 C)   Resp 16   Ht '5\' 7"'$  (1.702 m)   Wt 161 lb (73 kg)   SpO2 97%   BMI 25.22 kg/m   Appears  well nourished, well groomed  and in no distress.  Eyes: PERRLA, EOMs, conjunctiva no swelling or erythema. Sinuses: No frontal/maxillary tenderness ENT/Mouth: EAC's clear, TM's nl w/o erythema, bulging. Nares clear w/o erythema, swelling, exudates. Oropharynx clear without erythema or exudates. Oral hygiene is good. Tongue normal, non obstructing. Hearing intact.  Neck: Supple. Thyroid not palpable. Car 2+/2+ without bruits, nodes or JVD. Chest: Respirations nl with BS clear & equal w/o rales, rhonchi, wheezing or stridor.  Cor: Heart sounds normal w/ regular rate and rhythm without sig. murmurs, gallops, clicks or rubs. Peripheral pulses normal and equal  without edema.  Abdomen: Soft & bowel sounds normal. Non-tender w/o guarding, rebound, hernias, masses or organomegaly.  Lymphatics: Unremarkable.  Musculoskeletal: Full ROM all peripheral extremities, joint stability, 5/5 strength and normal gait.  Skin: Warm, dry without exposed rashes, lesions or ecchymosis apparent.  Neuro: Cranial nerves intact, reflexes equal bilaterally. Sensory-motor testing grossly intact. Tendon reflexes grossly intact.  Pysch: Alert & oriented x 3.  Insight and judgement nl & appropriate. No ideations.  Assessment and Plan:  1. Labile hypertension  - Continue medication, monitor blood pressure at home.  -  Continue DASH diet.  Reminder to go to the ER if any CP,  SOB, nausea, dizziness, severe HA, changes vision/speech.   - CBC with Differential/Platelet - COMPLETE METABOLIC PANEL WITH GFR - Magnesium - TSH  2. Hyperlipidemia, mixed  - Continue diet/meds, exercise,& lifestyle modifications.  - Continue monitor periodic cholesterol/liver & renal functions    - Lipid panel - TSH  3. Abnormal glucose  - Continue diet, exercise  - Lifestyle modifications.  - Monitor appropriate labs   - Hemoglobin A1c - Insulin, random  4. Vitamin D deficiency  - Continue supplementation.   - VITAMIN D 25 Hydroxy   5. Medication management  - CBC with Differential/Platelet - COMPLETE METABOLIC PANEL WITH GFR - Magnesium - Lipid panel - TSH - Hemoglobin A1c - Insulin, random - VITAMIN D 25 Hydroxy          Discussed  regular exercise, BP monitoring, weight control to achieve/maintain BMI less than 25 and discussed med and SE's. Recommended labs to assess and monitor clinical status with further disposition pending results of labs.  I discussed the  assessment and treatment plan with the patient. The patient was provided an opportunity to ask questions and all were answered. The patient agreed with the plan and demonstrated an understanding of the instructions.  I provided over 30 minutes of exam, counseling, chart review and  complex critical decision making.        The patient was advised to call back or seek an in-person evaluation if the symptoms worsen or if the condition fails to improve as anticipated.   Kirtland Bouchard, MD

## 2020-12-13 ENCOUNTER — Other Ambulatory Visit: Payer: Self-pay

## 2020-12-13 ENCOUNTER — Encounter: Payer: Self-pay | Admitting: Internal Medicine

## 2020-12-13 ENCOUNTER — Ambulatory Visit (INDEPENDENT_AMBULATORY_CARE_PROVIDER_SITE_OTHER): Payer: Medicare Other | Admitting: Internal Medicine

## 2020-12-13 VITALS — BP 125/79 | HR 69 | Temp 97.9°F | Resp 16 | Ht 67.0 in | Wt 161.0 lb

## 2020-12-13 DIAGNOSIS — Z79899 Other long term (current) drug therapy: Secondary | ICD-10-CM | POA: Diagnosis not present

## 2020-12-13 DIAGNOSIS — R7309 Other abnormal glucose: Secondary | ICD-10-CM

## 2020-12-13 DIAGNOSIS — E559 Vitamin D deficiency, unspecified: Secondary | ICD-10-CM | POA: Diagnosis not present

## 2020-12-13 DIAGNOSIS — E782 Mixed hyperlipidemia: Secondary | ICD-10-CM

## 2020-12-13 DIAGNOSIS — F900 Attention-deficit hyperactivity disorder, predominantly inattentive type: Secondary | ICD-10-CM | POA: Diagnosis not present

## 2020-12-13 DIAGNOSIS — R0989 Other specified symptoms and signs involving the circulatory and respiratory systems: Secondary | ICD-10-CM | POA: Diagnosis not present

## 2020-12-13 MED ORDER — AMPHETAMINE-DEXTROAMPHETAMINE 20 MG PO TABS: ORAL_TABLET | ORAL | 0 refills | Status: DC

## 2020-12-14 LAB — HEMOGLOBIN A1C
Hgb A1c MFr Bld: 5.6 % of total Hgb (ref <5.7)
Mean Plasma Glucose: 114 mg/dL
eAG (mmol/L): 6.3 mmol/L

## 2020-12-14 LAB — COMPLETE METABOLIC PANEL WITH GFR
AG Ratio: 1.6 (calc) (ref 1.0–2.5)
ALT: 19 U/L (ref 9–46)
AST: 19 U/L (ref 10–35)
Albumin: 4.5 g/dL (ref 3.6–5.1)
Alkaline phosphatase (APISO): 46 U/L (ref 35–144)
BUN: 18 mg/dL (ref 7–25)
CO2: 29 mmol/L (ref 20–32)
Calcium: 10.1 mg/dL (ref 8.6–10.3)
Chloride: 106 mmol/L (ref 98–110)
Creat: 1.03 mg/dL (ref 0.70–1.28)
Globulin: 2.9 g/dL (calc) (ref 1.9–3.7)
Glucose, Bld: 88 mg/dL (ref 65–99)
Potassium: 4.6 mmol/L (ref 3.5–5.3)
Sodium: 142 mmol/L (ref 135–146)
Total Bilirubin: 0.5 mg/dL (ref 0.2–1.2)
Total Protein: 7.4 g/dL (ref 6.1–8.1)
eGFR: 78 mL/min/{1.73_m2} (ref >=60)

## 2020-12-14 LAB — CBC WITH DIFFERENTIAL/PLATELET
Absolute Monocytes: 387 cells/uL (ref 200–950)
Basophils Absolute: 39 cells/uL (ref 0–200)
Basophils Relative: 0.8 %
Eosinophils Absolute: 147 cells/uL (ref 15–500)
Eosinophils Relative: 3 %
HCT: 44.3 % (ref 38.5–50.0)
Hemoglobin: 14.3 g/dL (ref 13.2–17.1)
Lymphs Abs: 1259 cells/uL (ref 850–3900)
MCH: 29.4 pg (ref 27.0–33.0)
MCHC: 32.3 g/dL (ref 32.0–36.0)
MCV: 91 fL (ref 80.0–100.0)
MPV: 10.2 fL (ref 7.5–12.5)
Monocytes Relative: 7.9 %
Neutro Abs: 3067 cells/uL (ref 1500–7800)
Neutrophils Relative %: 62.6 %
Platelets: 246 10*3/uL (ref 140–400)
RBC: 4.87 10*6/uL (ref 4.20–5.80)
RDW: 12.9 % (ref 11.0–15.0)
Total Lymphocyte: 25.7 %
WBC: 4.9 10*3/uL (ref 3.8–10.8)

## 2020-12-14 LAB — LIPID PANEL
Cholesterol: 156 mg/dL (ref <200)
HDL: 54 mg/dL (ref >=40)
LDL Cholesterol (Calc): 83 mg/dL (calc)
Non-HDL Cholesterol (Calc): 102 mg/dL (calc) (ref <130)
Total CHOL/HDL Ratio: 2.9 (calc) (ref <5.0)
Triglycerides: 97 mg/dL (ref <150)

## 2020-12-14 LAB — VITAMIN D 25 HYDROXY (VIT D DEFICIENCY, FRACTURES): Vit D, 25-Hydroxy: 96 ng/mL (ref 30–100)

## 2020-12-14 LAB — MAGNESIUM: Magnesium: 2.2 mg/dL (ref 1.5–2.5)

## 2020-12-14 LAB — TSH: TSH: 2.53 mIU/L (ref 0.40–4.50)

## 2020-12-14 LAB — INSULIN, RANDOM: Insulin: 5.8 u[IU]/mL

## 2020-12-14 NOTE — Progress Notes (Signed)
============================================================ -   Test results slightly outside the reference range are not unusual. If there is anything important, I will review this with you,  otherwise it is considered normal test values.  If you have further questions,  please do not hesitate to contact me at the office or via My Chart.  ============================================================ ============================================================  -  Total Chol = 156  -   Excellent ============================================================ ============================================================  -  A1c    back down to 5.6%    -    back in Normal Non-Diabetic Range ============================================================ ============================================================  -  All Else - CBC - Kidneys - Electrolytes - Liver - Magnesium & Thyroid    - all  Normal / OK ============================================================  - Keep up the [place] Work   ============================================================ ============================================================

## 2020-12-16 DIAGNOSIS — U071 COVID-19: Secondary | ICD-10-CM

## 2020-12-17 DIAGNOSIS — U071 COVID-19: Secondary | ICD-10-CM | POA: Insufficient documentation

## 2020-12-17 HISTORY — DX: COVID-19: U07.1

## 2020-12-17 MED ORDER — PROMETHAZINE-DM 6.25-15 MG/5ML PO SYRP: 5.0000 mL | ORAL_SOLUTION | Freq: Four times a day (QID) | ORAL | 1 refills | Status: DC | PRN

## 2020-12-17 MED ORDER — DEXAMETHASONE 1 MG PO TABS: ORAL_TABLET | ORAL | 0 refills | Status: DC

## 2020-12-17 NOTE — Telephone Encounter (Signed)
THIS ENCOUNTER IS A VIRTUAL VISIT DUE TO COVID-19 - PATIENT WAS NOT SEEN IN THE OFFICE.  PATIENT HAS CONSENTED TO VIRTUAL VISIT / TELEMEDICINE VISIT   Virtual Visit via telephone Note  I connected with  Francisco Dixon on 12/16/2020 by telephone.  I verified that I am speaking with the correct person using two identifiers.    I discussed the limitations of evaluation and management by telemedicine and the availability of in person appointments. The patient expressed understanding and agreed to proceed.  History of Present Illness:  70 y.o. patient contacted office reporting URI sx. he tested positive by rapid. OV was conducted by telephone to minimize exposure. This patient was vaccinated for covid 19, last boosted in 02/2020.   He reports woke up with sore throat, non-productive cough, HA, body aches, fever up to 101, chills, slept most of the afternoon, tested positive on rapid screen at home later in the day. This AM he reports is feeling much improved thus far though hasn't checked temp. Denies CP, dyspnea, fatigue, dizziness, wheezing.   He has taken a few aspirin for fever and cough drops and reports good results.   He denies know sick contacts, but has been at exercise class in a group 3 days a week.    Medications  Current Outpatient Medications (Endocrine & Metabolic):    dexamethasone (DECADRON) 1 MG tablet, Take 3 tabs for 3 days, 2 tabs for 3 days 1 tab for 5 days. Take with food.  Current Outpatient Medications (Cardiovascular):    rosuvastatin (CRESTOR) 20 MG tablet, Take 1 tablet  every day  for Cholesterol  Current Outpatient Medications (Respiratory):    promethazine-dextromethorphan (PROMETHAZINE-DM) 6.25-15 MG/5ML syrup, Take 5 mLs by mouth 4 (four) times daily as needed for cough.  Current Outpatient Medications (Analgesics):    aspirin EC 81 MG tablet, Take 81 mg by mouth daily.   meloxicam (MOBIC) 15 MG tablet, Take  1/2 to 1 tablet  Daily with food for Pain &  Inflammation - try limit to 5 days /week to avoid kidney damage  Current Outpatient Medications (Hematological):    cyanocobalamin (,VITAMIN B-12,) 1000 MCG/ML injection, Inject 1039mg once weekly for 8 weeks, then once a month  Current Outpatient Medications (Other):    amphetamine-dextroamphetamine (ADDERALL) 20 MG tablet, Take  1/2 to 1 tablet  2 x / day as needed for Focus & Concentration   Cholecalciferol (VITAMIN D3) 5000 units CAPS, Takes 2 caps (10,000 Units) daily   Flaxseed, Linseed, (FLAXSEED OIL) 1000 MG CAPS, Take 2 capsules by mouth daily.   meclizine (ANTIVERT) 25 MG tablet, Take 1/2 to 1 tablet 2 to 3 x /day as needed for Dizziness / Vertigo   NEEDLE, DISP, 25 G (BD SAFETYGLIDE SHIELDED NEEDLE) 25G X 1" MISC, Use for B12 injections   Omega-3 Fatty Acids (FISH OIL) 1000 MG CAPS, Takes 1 capsule daily   OVER THE COUNTER MEDICATION, OTC allergy tablet 1 tablet daily.   SYRINGE-NEEDLE, DISP, 3 ML (BD SAFETYGLIDE SYRINGE/NEEDLE) 25G X 1" 3 ML MISC, Use for B12 injections.   SYRINGE/NEEDLE, DISP, 1 ML (B-D SYRINGE/NEEDLE 1CC/25GX5/8) 25G X 5/8" 1 ML MISC, 1 SQ injection once weekly   TURMERIC PO, Take by mouth daily.   venlafaxine XR (EFFEXOR-XR) 75 MG 24 hr capsule, TAKE 1 CAPSULE BY MOUTH DAILY FOR MOOD  Allergies:  Allergies  Allergen Reactions   Other     Bee stings    Problem list He has Labile hypertension; Hyperlipidemia, mixed; Vitamin  D deficiency; Medication management; Abnormal glucose; Allergy; Major depression in full remission ([place]); Numbness and tingling of both feet; B12 deficiency; and COVID-19 (12/16/2020) on their problem list.   Social History:   reports that he has never smoked. He has never used smokeless tobacco. He reports current alcohol use. He reports that he does not use drugs.  Observations/Objective:  General : Well sounding patient in no apparent distress HEENT: no hoarseness, no cough for duration of visit Lungs: speaks in complete  sentences, no audible wheezing, no apparent distress Neurological: alert, oriented x 3 Psychiatric: pleasant, judgement appropriate   Assessment and Plan:  Covid 19 Covid 19 positive per rapid screening test at home Risk factors include: age, htn Symptoms are: mild Discussed treatment options including supportive care, steroid taper, antiviral paxlovid He declines antiviral at this time, would like to try steroid and supportive care Immue support with vitamin D 1000 mg daily, zinc 50 mg daily, vitamin D Reviewed doing regular breathing exercises, proning Take tylenol PRN temp 101+ Push hydration Regular ambulation or calf exercises exercises for clot prevention and 81 mg ASA unless contraindicated Sx supportive therapy suggested Follow up via mychart or telephone if needed Advised patient obtain O2 monitor; present to ED if persistently <88% or with severe dyspnea, CP, fever uncontrolled by tylenol, confusion, sudden decline Should remain in isolation until at least 5 days from onset of sx, 24-48 hours fever free without tylenol, sx such as cough are improved.  -     dexamethasone (DECADRON) 1 MG tablet; Take 3 tabs for 3 days, 2 tabs for 3 days 1 tab for 5 days. Take with food. -     promethazine-dextromethorphan (PROMETHAZINE-DM) 6.25-15 MG/5ML syrup; Take 5 mLs by mouth 4 (four) times daily as needed for cough.   Follow Up Instructions:  I discussed the assessment and treatment plan with the patient. The patient was provided an opportunity to ask questions and all were answered. The patient agreed with the plan and demonstrated an understanding of the instructions.   The patient was advised to call back or seek an in-person evaluation if the symptoms worsen or if the condition fails to improve as anticipated.  I provided 20 minutes of non-face-to-face time during this encounter.   Izora Ribas, NP

## 2021-02-02 ENCOUNTER — Other Ambulatory Visit: Payer: Self-pay | Admitting: Adult Health

## 2021-02-02 MED ORDER — TIZANIDINE HCL 4 MG PO TABS: 2.0000 mg | ORAL_TABLET | Freq: Three times a day (TID) | ORAL | 0 refills | Status: DC | PRN

## 2021-03-15 ENCOUNTER — Encounter: Payer: Self-pay | Admitting: Internal Medicine

## 2021-03-17 ENCOUNTER — Ambulatory Visit: Payer: Medicare Other | Admitting: Nurse Practitioner

## 2021-03-20 ENCOUNTER — Encounter: Payer: Self-pay | Admitting: Internal Medicine

## 2021-03-20 NOTE — Patient Instructions (Signed)

## 2021-03-20 NOTE — Progress Notes (Signed)
Future Appointments  Date Time Provider Department  03/21/2021  2:30 PM Unk Pinto, MD [facility]  06/20/2021            CPE  2:00 PM Unk Pinto, MD [facility]  09/08/2021         Wellness 11:30 AM Liane Comber, NP [facility]    History of Present Illness:       This very nice 70 y.o. [name] presents for 3 month follow up with HTN, HLD, Pre-Diabetes, Vitamin B12  and Vitamin D Deficiency.  Patient has hx/o ADD treated with Adderall with improved focus & concentration.        Today patient also reports sx's suggestive of OAB with occasional urge incontinence  and also sx's of LUTS - decreased force of  urinary stream, dbl voiding  & nocturia  x 2-4.        Patient is monitored expectantly for labile HTN & BP has been controlled at home. Today's BP is at goal - 134/86. Patient has had no complaints of any cardiac type chest pain, palpitations, dyspnea Vertell Limber /PND, dizziness, claudication, or dependent edema.       Hyperlipidemia is controlled with diet & Crestor. Patient denies myalgias or other med SE's. Last Lipids were  Lab Results  Component Value Date   CHOL 156 12/13/2020   HDL 54 12/13/2020   LDLCALC 83 12/13/2020   TRIG 97 12/13/2020   CHOLHDL 2.9 12/13/2020     Also, the patient has history of abnormal glucose and has had no symptoms of reactive hypoglycemia, diabetic polys, paresthesias or visual blurring.  Last A1c was normal & at goal:  Lab Results  Component Value Date   HGBA1C 5.6 12/13/2020           In July 2020,  patient's Vit B12 measured very low at "183"  and he was initiated on B12 shots.                                                     Further, the patient also has history of Vitamin D Deficiency ("29" /2017) and supplements vitamin D without any suspected side-effects. Last vitamin D was at goal:   Lab Results  Component Value Date   VD25OH 96 12/13/2020    Current Outpatient Medications on File Prior to Visit  Medication Sig    ADDERALL 20 MG tablet Take  1/2 to 1 tablet  2 x / day as needed f   aspirin EC 81 MG tablet Take 81 mg by mouth daily.   VITAMIN D 5,000 units  Takes 2 caps (10,000 Units) daily   VITAMIN B-12 1000 MCG inj Inject 1 once a month   FLAXSEED OIL 1000 MG CAPS Take 2 capsules  daily.   meclizine 25 MG tablet Take 1/2 to 1 tablet 2 to 3 x /day as needed    meloxicam 15 MG tablet Take  1/2 to 1 tablet  Daily    Omega-3 FISH OIL 1000 MG  Takes 1 capsule daily   OTC allergy tablet  1 tablet daily.   rosuvastatin 20 MG tablet Take 1 tablet  every day     tiZANidine 4 MG tablet Take 0.5-1 tablets every 8  hours as needed    TURMERIC  Take  daily.   venlafaxine-XR 75 MG  TAKE 1 CAPSULE  DAILY     Allergies  Allergen Reactions   Other     Bee stings     PMHx:   Past Medical History:  Diagnosis Date   GERD (gastroesophageal reflux disease)    Hypercholesterolemia    Hypertension    labile    Plantar fasciitis, right 12/07/2016   Vitamin D deficiency      Immunization History  Administered Date(s) Administered   PFIZER SARS-COV-2 Vacc 05/24/2019,      06/19/2019,    02/16/2020   Pneumococcal -13 04/29/2018   Pneumococcal -23 08/18/2019   Td 01/03/2017     Past Surgical History:  Procedure Laterality Date   COLONOSCOPY N/A 2002,2007,2012   ROTATOR CUFF REPAIR Right 2015    FHx:    Reviewed / unchanged  SHx:    Reviewed / unchanged   Systems Review:  Constitutional: Denies fever, chills, wt changes, headaches, insomnia, fatigue, night sweats, change in appetite. Eyes: Denies redness, blurred vision, diplopia, discharge, itchy, watery eyes.  ENT: Denies discharge, congestion, post nasal drip, epistaxis, sore throat, earache, hearing loss, dental pain, tinnitus, vertigo, sinus pain, snoring.  CV: Denies chest pain, palpitations, irregular heartbeat, syncope, dyspnea, diaphoresis, orthopnea, PND, claudication or edema. Respiratory: denies cough, dyspnea, DOE, pleurisy,  hoarseness, laryngitis, wheezing.  Gastrointestinal: Denies dysphagia, odynophagia, heartburn, reflux, water brash, abdominal pain or cramps, nausea, vomiting, bloating, diarrhea, constipation, hematemesis, melena, hematochezia  or hemorrhoids. Genitourinary: Denies dysuria, discharge, hematuria or flank pain.     Has frequency, urgency, incontinence, nocturia, hesitancy sx's as above. Musculoskeletal: Denies arthralgias, myalgias, stiffness, jt. swelling, pain, limping or strain/sprain.  Skin: Denies pruritus, rash, hives, warts, acne, eczema or change in skin lesion(s). Neuro: No weakness, tremor, incoordination, spasms, paresthesia or pain. Psychiatric: Denies confusion, memory loss or sensory loss. Endo: Denies change in weight, skin or hair change.  Heme/Lymph: No excessive bleeding, bruising or enlarged lymph nodes.  Physical Exam  BP 134/86   Pulse 88   Temp 97.9 F (36.6 C)   Resp 16   Ht 5\' 7"  (1.702 m)   Wt 159 lb 12.8 oz (72.5 kg)   SpO2 97%   BMI 25.03 kg/m   Appears  well nourished, well groomed  and in no distress.  Eyes: PERRLA, EOMs, conjunctiva no swelling or erythema. Sinuses: No frontal/maxillary tenderness ENT/Mouth: EAC's clear, TM's nl w/o erythema, bulging. Nares clear w/o erythema, swelling, exudates. Oropharynx clear without erythema or exudates. Oral hygiene is good. Tongue normal, non obstructing. Hearing intact.  Neck: Supple. Thyroid not palpable. Car 2+/2+ without bruits, nodes or JVD. Chest: Respirations nl with BS clear & equal w/o rales, rhonchi, wheezing or stridor.  Cor: Heart sounds normal w/ regular rate and rhythm without sig. murmurs, gallops, clicks or rubs. Peripheral pulses normal and equal  without edema.  Abdomen: Soft & bowel sounds normal. Non-tender w/o guarding, rebound, hernias, masses or organomegaly.  Lymphatics: Unremarkable.  Musculoskeletal: Full ROM all peripheral extremities, joint stability, 5/5 strength and normal gait.   Skin: Warm, dry without exposed rashes, lesions or ecchymosis apparent.  Neuro: Cranial nerves intact, reflexes equal bilaterally. Sensory-motor testing grossly intact. Tendon reflexes grossly intact.  Pysch: Alert & oriented x 3.  Insight and judgement nl & appropriate. No ideations.  Assessment and Plan:  1.  Labile hypertension  - Continue medication, monitor blood pressure at home.  - Continue DASH diet.  Reminder to go to the ER if any CP,  SOB, nausea, dizziness, severe HA, changes vision/speech.  - CBC with  Differential/Platelet - COMPLETE METABOLIC PANEL WITH GFR - Magnesium - TSH  2. Hyperlipidemia, mixed  - Continue diet/meds, exercise,& lifestyle modifications.  - Continue monitor periodic cholesterol/liver & renal functions    - Lipid panel - TSH  3. Abnormal glucose  - Continue diet, exercise  - Lifestyle modifications.  - Monitor appropriate labs    - Hemoglobin A1c - Insulin, random  4. Vitamin D deficiency  - Continue supplementation   - VITAMIN D 25 Hydroxy  5. Attention deficit hyperactivity disorder (ADHD), predominantly inattentive type   6. B12 deficiency  - Vitamin B12 - Methylmalonic acid, serum  7. Medication management  - CBC with Differential/Platelet - COMPLETE METABOLIC PANEL WITH GFR - Magnesium - Lipid panel - TSH - Hemoglobin A1c - Insulin, random - VITAMIN D 25 Hydroxy  - Vitamin B12 - Methylmalonic acid, serum         Discussed  regular exercise, BP monitoring, weight control to achieve/maintain BMI less than 25 and discussed med and SE's. Recommended labs to assess and monitor clinical status with further disposition pending results of labs.  I discussed the assessment and treatment plan with the patient. The patient was provided an opportunity to ask questions and all were answered. The patient agreed with the plan and demonstrated an understanding of the instructions.  I provided over 30 minutes of exam, counseling,  chart review and  complex critical decision making.         Patient is amenable to Urology consultation to evaluate for OAB vs. [place]. Patient given 1 week sx each of Myrbetriq 25 & 50 mg to try before he sees Urologist .         The patient was advised to call back or seek an in-person evaluation if the symptoms worsen or if the condition fails to improve as anticipated.   Kirtland Bouchard, MD

## 2021-03-21 ENCOUNTER — Ambulatory Visit (INDEPENDENT_AMBULATORY_CARE_PROVIDER_SITE_OTHER): Payer: Medicare Other | Admitting: Internal Medicine

## 2021-03-21 ENCOUNTER — Encounter: Payer: Self-pay | Admitting: Internal Medicine

## 2021-03-21 ENCOUNTER — Other Ambulatory Visit: Payer: Self-pay

## 2021-03-21 VITALS — BP 134/86 | HR 88 | Temp 97.9°F | Resp 16 | Ht 67.0 in | Wt 159.8 lb

## 2021-03-21 DIAGNOSIS — R0989 Other specified symptoms and signs involving the circulatory and respiratory systems: Secondary | ICD-10-CM | POA: Diagnosis not present

## 2021-03-21 DIAGNOSIS — Z79899 Other long term (current) drug therapy: Secondary | ICD-10-CM

## 2021-03-21 DIAGNOSIS — E559 Vitamin D deficiency, unspecified: Secondary | ICD-10-CM | POA: Diagnosis not present

## 2021-03-21 DIAGNOSIS — R7309 Other abnormal glucose: Secondary | ICD-10-CM | POA: Diagnosis not present

## 2021-03-21 DIAGNOSIS — F900 Attention-deficit hyperactivity disorder, predominantly inattentive type: Secondary | ICD-10-CM | POA: Diagnosis not present

## 2021-03-21 DIAGNOSIS — E782 Mixed hyperlipidemia: Secondary | ICD-10-CM | POA: Diagnosis not present

## 2021-03-21 DIAGNOSIS — E538 Deficiency of other specified B group vitamins: Secondary | ICD-10-CM

## 2021-03-21 DIAGNOSIS — Z23 Encounter for immunization: Secondary | ICD-10-CM

## 2021-03-22 ENCOUNTER — Other Ambulatory Visit: Payer: Self-pay | Admitting: Internal Medicine

## 2021-03-22 DIAGNOSIS — N401 Enlarged prostate with lower urinary tract symptoms: Secondary | ICD-10-CM

## 2021-03-22 DIAGNOSIS — N3281 Overactive bladder: Secondary | ICD-10-CM

## 2021-03-23 LAB — LIPID PANEL
Cholesterol: 141 mg/dL (ref <200)
HDL: 45 mg/dL (ref >=40)
LDL Cholesterol (Calc): 68 mg/dL (calc)
Non-HDL Cholesterol (Calc): 96 mg/dL (calc) (ref <130)
Total CHOL/HDL Ratio: 3.1 (calc) (ref <5.0)
Triglycerides: 216 mg/dL — ABNORMAL HIGH (ref <150)

## 2021-03-23 LAB — COMPLETE METABOLIC PANEL WITH GFR
AG Ratio: 1.6 (calc) (ref 1.0–2.5)
ALT: 15 U/L (ref 9–46)
AST: 16 U/L (ref 10–35)
Albumin: 4.2 g/dL (ref 3.6–5.1)
Alkaline phosphatase (APISO): 45 U/L (ref 35–144)
BUN: 18 mg/dL (ref 7–25)
CO2: 30 mmol/L (ref 20–32)
Calcium: 10 mg/dL (ref 8.6–10.3)
Chloride: 104 mmol/L (ref 98–110)
Creat: 1.09 mg/dL (ref 0.70–1.28)
Globulin: 2.7 g/dL (calc) (ref 1.9–3.7)
Glucose, Bld: 78 mg/dL (ref 65–99)
Potassium: 4.3 mmol/L (ref 3.5–5.3)
Sodium: 142 mmol/L (ref 135–146)
Total Bilirubin: 0.4 mg/dL (ref 0.2–1.2)
Total Protein: 6.9 g/dL (ref 6.1–8.1)
eGFR: 73 mL/min/{1.73_m2} (ref >=60)

## 2021-03-23 LAB — CBC WITH DIFFERENTIAL/PLATELET
Absolute Monocytes: 435 cells/uL (ref 200–950)
Basophils Absolute: 50 cells/uL (ref 0–200)
Basophils Relative: 0.9 %
Eosinophils Absolute: 160 cells/uL (ref 15–500)
Eosinophils Relative: 2.9 %
HCT: 39.5 % (ref 38.5–50.0)
Hemoglobin: 13.3 g/dL (ref 13.2–17.1)
Lymphs Abs: 1540 cells/uL (ref 850–3900)
MCH: 30.2 pg (ref 27.0–33.0)
MCHC: 33.7 g/dL (ref 32.0–36.0)
MCV: 89.8 fL (ref 80.0–100.0)
MPV: 10.6 fL (ref 7.5–12.5)
Monocytes Relative: 7.9 %
Neutro Abs: 3317 cells/uL (ref 1500–7800)
Neutrophils Relative %: 60.3 %
Platelets: 318 10*3/uL (ref 140–400)
RBC: 4.4 10*6/uL (ref 4.20–5.80)
RDW: 12.3 % (ref 11.0–15.0)
Total Lymphocyte: 28 %
WBC: 5.5 10*3/uL (ref 3.8–10.8)

## 2021-03-23 LAB — VITAMIN B12: Vitamin B-12: 623 pg/mL (ref 200–1100)

## 2021-03-23 LAB — HEMOGLOBIN A1C
Hgb A1c MFr Bld: 5.8 % of total Hgb — ABNORMAL HIGH (ref <5.7)
Mean Plasma Glucose: 120 mg/dL
eAG (mmol/L): 6.6 mmol/L

## 2021-03-23 LAB — MAGNESIUM: Magnesium: 2.2 mg/dL (ref 1.5–2.5)

## 2021-03-23 LAB — VITAMIN D 25 HYDROXY (VIT D DEFICIENCY, FRACTURES): Vit D, 25-Hydroxy: 107 ng/mL — ABNORMAL HIGH (ref 30–100)

## 2021-03-23 LAB — TSH: TSH: 2.24 mIU/L (ref 0.40–4.50)

## 2021-03-23 LAB — INSULIN, RANDOM: Insulin: 6 u[IU]/mL

## 2021-03-23 LAB — METHYLMALONIC ACID, SERUM: Methylmalonic Acid, Quant: 135 nmol/L (ref 87–318)

## 2021-03-23 NOTE — Progress Notes (Signed)
============================================================ -   Test results slightly outside the reference range are not unusual. If there is anything important, I will review this with you,  otherwise it is considered normal test values.  If you have further questions,  please do not hesitate to contact me at the office or via My Chart.  ============================================================ ============================================================  -  Total Chol = 141   &   LDL Chol = 68    -    Both    Excellent   - Very low risk for Heart Attack  / Stroke ============================================================ ============================================================  -  But Triglycerides (   216  ) or fats in blood are too high  (goal is less than 150)    - Recommend avoid fried & greasy foods,  sweets / candy,   - Avoid white rice  (brown or wild rice or Quinoa is OK),   - Avoid white potatoes  (sweet potatoes are OK)   - Avoid anything made from white flour  - bagels, doughnuts, rolls, buns, biscuits, white and   wheat breads, pizza crust and traditional  pasta made of white flour & egg white  - (vegetarian pasta or spinach or wheat pasta is OK).    - Multi-grain bread is OK - like multi-grain flat bread or  sandwich thins.   - Avoid alcohol in excess.   - Exercise is also important. ============================================================ ============================================================  -  A1c = 5.8% - Again borderline elevated in preDiabetic range , So  \  - Avoid Sweets, Candy & White Stuff   - White Rice, White Potatoes, White Flour  - Breads &  Pasta ============================================================ ============================================================  -  Vit D = 107 - Borderline   high Normal - OK up to 115                                                           - So recommend keep dose same for  now . ============================================================ ============================================================  -  Vitamin B12 level - Normal - Great  ============================================================ ============================================================  -  All Else - CBC - Kidneys - Electrolytes - Liver - Magnesium & Thyroid    - all  Normal / OK ============================================================ ============================================================  -  Keep up the [place] Work  !  ============================================================ ============================================================

## 2021-03-28 ENCOUNTER — Encounter: Payer: Self-pay | Admitting: Adult Health

## 2021-03-28 ENCOUNTER — Other Ambulatory Visit: Payer: Self-pay | Admitting: Internal Medicine

## 2021-03-28 MED ORDER — DEXAMETHASONE 4 MG PO TABS: ORAL_TABLET | ORAL | 0 refills | Status: DC

## 2021-03-28 MED ORDER — PSEUDOEPHEDRINE HCL ER 120 MG PO TB12: ORAL_TABLET | ORAL | 0 refills | Status: DC

## 2021-03-28 MED ORDER — AZITHROMYCIN 250 MG PO TABS: ORAL_TABLET | ORAL | 1 refills | Status: DC

## 2021-04-15 ENCOUNTER — Other Ambulatory Visit: Payer: Self-pay | Admitting: Internal Medicine

## 2021-04-15 DIAGNOSIS — Z79899 Other long term (current) drug therapy: Secondary | ICD-10-CM

## 2021-04-15 DIAGNOSIS — M316 Other giant cell arteritis: Secondary | ICD-10-CM

## 2021-04-15 DIAGNOSIS — H532 Diplopia: Secondary | ICD-10-CM

## 2021-04-15 MED ORDER — DEXAMETHASONE 4 MG PO TABS: ORAL_TABLET | ORAL | 0 refills | Status: DC

## 2021-04-19 DIAGNOSIS — H532 Diplopia: Secondary | ICD-10-CM | POA: Diagnosis not present

## 2021-04-26 ENCOUNTER — Encounter: Payer: Self-pay | Admitting: Internal Medicine

## 2021-05-08 ENCOUNTER — Other Ambulatory Visit: Payer: Self-pay | Admitting: Adult Health Nurse Practitioner

## 2021-05-08 DIAGNOSIS — F332 Major depressive disorder, recurrent severe without psychotic features: Secondary | ICD-10-CM

## 2021-05-12 DIAGNOSIS — H532 Diplopia: Secondary | ICD-10-CM | POA: Diagnosis not present

## 2021-05-12 DIAGNOSIS — H538 Other visual disturbances: Secondary | ICD-10-CM | POA: Diagnosis not present

## 2021-05-12 DIAGNOSIS — H4920 Sixth [abducent] nerve palsy, unspecified eye: Secondary | ICD-10-CM | POA: Diagnosis not present

## 2021-05-13 ENCOUNTER — Other Ambulatory Visit: Payer: Self-pay | Admitting: Internal Medicine

## 2021-05-16 NOTE — Progress Notes (Signed)
Future Appointments  Date Time Provider Department  05/17/2021 10:00 AM Unk Pinto, MD [facility]  06/20/2021  2:00 PM Unk Pinto, MD [facility]  09/08/2021 11:30 AM Liane Comber, NP [facility]    History of Present Illness:     Patient is a very nice 71 yo [name] with HTN who's had recent episodes of diplopia & saw his eye  dr (OD) & was referred to Dr Gevena Cotton - Pediatric /Adult strabismus specialist who recommended check CBC, A1c, ESR and  hsCPR. Patient reports 1st episode on Xmas eve lasting 6 hours. Then 1 week later had  2 days of diplopia lasting all day. Now for the last 2-3 weeks no more diplopia. Patient denies any loss of vision, temporal HA, chewing pain or other myalgias or arthralgias.  Recent A1c = 5.8%   Medications    rosuvastatin (CRESTOR) 20 MG tablet, Take 1 tablet  every day  for Cholesterol    pseudoephedrine (SUDAFED) 120 MG 12 hr tablet, Take  1 tablet  2 x /day (every 12 hours)  for Sinus & Chest Congestion    aspirin EC 81 MG tablet, Take 81 mg by mouth daily.   meloxicam (MOBIC) 15 MG tablet, Take  1/2 to 1 tablet  Daily with food for Pain & Inflammation - try limit to 5 days /week to avoid kidney damage    VITAMIN B-12 1000 MCG/ML injection, Inject 1043mg weekly  once a month    ADDERALL) 20 MG tablet, Take  1/2 to 1 tablet  2 x / day as needed for Focus & Concentration   VITAMIN D 5000 units , Takes 2 caps (10,000 Units) daily   FLAXSEED OIL 1000 MG CAPS, Take 2 capsules  daily.   meclizine (ANTIVERT) 25 MG tablet, Take 1/2 to 1 tablet 2 to 3 x /day as needed for Dizziness / Vertigo   Omega-3 Fatty Acids (FISH OIL) 1000 MG CAPS, Takes 1 capsule daily   OTC allergy tablet 1 tablet daily.   tiZANidine (ZANAFLEX) 4 MG tablet, Take 0.5-1 tablets  every 8 (eight) hours as needed for muscle spasms.   TURMERIC PO, Take  daily.   venlafaxine XR (EFFEXOR-XR) 75 MG 24 hr capsule, Take  1 capsule  Daily    Problem list He has Labile  hypertension; Hyperlipidemia, mixed; Vitamin D deficiency; Medication management; Abnormal glucose; Allergy; Major depression in full remission (HPax; Numbness and tingling of both feet; B12 deficiency; and COVID-19 (12/16/2020) on their problem list.   Observations/Objective:  BP (!) 147/85    Pulse 76    Temp 97.9 F (36.6 C)    Resp 17    Ht _0  (1.702 m)    Wt 162 lb 12.8 oz (73.8 kg)    SpO2 96%    BMI 25.50 kg/m   HEENT - WNL. EOMs conjugate. PERRLA. N/O/P - clear. Neck - supple.  Chest - Clear equal BS. Cor - Nl HS. RRR w/o sig MGR. PP 1(+). No edema. MS- FROM w/o deformities.  Gait Nl. Neuro -  Nl w/o focal abnormalities.  Assessment and Plan:   1. Acute intractable headache  - Myasthenia gravis panel 2 - Sedimentation rate - C-reactive protein  2. Diplopia  - Myasthenia gravis panel 2 - Sedimentation rate - C-reactive protein   Follow Up Instructions:       I discussed the assessment and treatment plan with the patient. The patient was provided an opportunity to ask questions and all were answered. The patient  agreed with the plan and demonstrated an understanding of the instructions.       The patient was advised to call back or seek an in-person evaluation if the symptoms worsen or if the condition fails to improve as anticipated.    Kirtland Bouchard, MD

## 2021-05-17 ENCOUNTER — Ambulatory Visit (INDEPENDENT_AMBULATORY_CARE_PROVIDER_SITE_OTHER): Payer: Medicare Other | Admitting: Internal Medicine

## 2021-05-17 ENCOUNTER — Encounter: Payer: Self-pay | Admitting: Internal Medicine

## 2021-05-17 ENCOUNTER — Other Ambulatory Visit: Payer: Self-pay

## 2021-05-17 VITALS — BP 147/85 | HR 76 | Temp 97.9°F | Resp 17 | Ht 67.0 in | Wt 162.8 lb

## 2021-05-17 DIAGNOSIS — R519 Headache, unspecified: Secondary | ICD-10-CM

## 2021-05-17 DIAGNOSIS — H532 Diplopia: Secondary | ICD-10-CM

## 2021-05-17 NOTE — Patient Instructions (Signed)
Diplopia ?Diplopia is a condition in which a person sees two of a single object. It is also called double vision. There are two types of diplopia. ?Monocular diplopia. This is double vision that is present when only one eye is open. It can occur in one or both eyes. Monocular diplopia is often caused by irregularity in the surface layer of your eye (cornea), a clouding of the lens in your eye (cataract), or a problem in the way your eye focuses light. ?Binocular diplopia. This is double vision that is present only when both eyes are open. When you close one eye, the double vision will go away. Binocular diplopia may be more serious. It can be caused by: ?Problems with the nerves or muscles that are responsible for eye movement. ?Disease of the nerves (neurologic disease). ?Immune system conditions, such as Graves' disease. ?Migraine headaches. ?Tumors. ?An infection. ?A stroke. ?An injury. ?There are many causes of diplopia. Some are not dangerous and can be easily corrected. Diplopia may also be a symptom of a serious medical problem. You may need to see a health care provider who specializes in eye conditions (ophthalmologist) or a nerve specialist (neurologist) to find the cause. ?Follow these instructions at home: ? ?Pay attention to any changes in your vision. Tell your health care provider about them. ?Do not drive or operate machinery if diplopia interferes with your vision. ?Keep all follow-up visits. This is important. ?Contact a health care provider if: ?Your diplopia gets worse. ?You develop any other symptoms along with your diplopia, such as: ?Weakness. ?Numbness. ?Headache. ?Eye pain. ?Clumsiness. ?Nausea. ?Drooping eyelids. ?Abnormal movement of one eye. ?Get help right away if: ?You have sudden vision loss. ?You suddenly get a very bad headache. ?You have sudden weakness or numbness. ?You develop droopiness on one side of your face. ?You suddenly lose the ability to speak, understand speech, or  both. ?You develop difficulty breathing. ?These symptoms may represent a serious problem that is an emergency. Do not wait to see if the symptoms will go away. Get medical help right away. Call your local emergency services (911 in the [place] Do not drive yourself to the hospital. ?Summary ?Diplopia is a condition in which a person sees two of a single object. It is also called double vision. ?Monocular diplopia is double vision that affects only one eye at a time. It is often caused by irregularity in the surface layer of your eye (cornea), a clouding of the lens in your eye (cataract), or a problem in the way your eye focuses light. ?Binocular diplopia is double vision that affects both eyes at the same time. However, when you shut one eye, the double vision will go away. Binocular diplopia may be more serious. ?If you have diplopia, you may need to see a health care provider who specializes in eye conditions (ophthalmologist) or a nerve specialist (neurologist) to find the cause. ?This information is not intended to replace advice given to you by your health care provider. Make sure you discuss any questions you have with your health care provider. ?Document Revised: 08/05/2020 Document Reviewed: 08/05/2020 ?Elsevier Patient Education ? 2022 [employer] ? ?

## 2021-05-17 NOTE — Telephone Encounter (Signed)
For the referral

## 2021-05-18 NOTE — Progress Notes (Signed)
=============================================================== °-   Test results slightly outside the reference range are not unusual. If there is anything important, I will review this with you,  otherwise it is considered normal test values.  If you have further questions,  please do not hesitate to contact me at the office or via My Chart.  =============================================================== ===============================================================  - Sed rate  & CRP tests  both Normal & OK   - Awaiting results of Myasthenia test   =============================================================== ===============================================================

## 2021-05-26 LAB — MYASTHENIA GRAVIS PANEL 2
A CHR BINDING ABS: <0.3 nmol/L
ACHR Blocking Abs: <15 % Inhibition (ref <15)
Acetylchol Modul Ab: 6 % Inhibition

## 2021-05-26 LAB — C-REACTIVE PROTEIN: CRP: 1.7 mg/L (ref <8.0)

## 2021-05-26 LAB — SEDIMENTATION RATE: Sed Rate: 6 mm/h (ref 0–20)

## 2021-05-26 NOTE — Progress Notes (Signed)
=============================================================== °=============================================================== ° °-    The Myasthenia Gravis tests finally returned & are Normal,                                                                            so don't need to refer to Neurologist  - All OK  =============================================================== ===============================================================

## 2021-06-19 ENCOUNTER — Encounter: Payer: Self-pay | Admitting: Internal Medicine

## 2021-06-19 MED ORDER — VITAMIN D3 125 MCG (5000 UT) PO CAPS: ORAL_CAPSULE | ORAL | Status: AC

## 2021-06-19 MED ORDER — ASPIRIN EC 81 MG PO TBEC: DELAYED_RELEASE_TABLET | ORAL | Status: AC

## 2021-06-19 NOTE — Patient Instructions (Signed)
Due to recent changes in healthcare laws, you may see the results of your imaging and laboratory studies on MyChart before your provider has had a chance to review them.  We understand that in some cases there may be results that are confusing or concerning to you. Not all laboratory results come back in the same time frame and the provider may be waiting for multiple results in order to interpret others.  Please give us 48 hours in order for your provider to thoroughly review all the results before contacting the office for clarification of your results.  ° °+++++++++++++++++++++++++++++++ ° Vit D  & °Vit C 1,000 mg   °are recommended to help protect  °against the Covid-19 and other Corona viruses.  ° ° Also it's recommended  °to take  °Zinc 50 mg  °to help  °protect against the Covid-19   °and best place to get ° is also on [employer]  °and don't pay more than 6-8 cents /pill !  °================================ °Coronavirus (COVID-19) Are you at risk? ° °Are you at risk for the Coronavirus (COVID-19)? ° °To be considered HIGH RISK for Coronavirus (COVID-19), you have to meet the following criteria: ° °Traveled to [place], [place], [place], [place] or [place]; or in the [place] to [place], [place], [place]  °or [place]; and have fever, cough, and shortness of breath within the last 2 weeks of travel OR °Been in close contact with a person diagnosed with COVID-19 within the last 2 weeks and have  °fever, cough,and shortness of breath ° °IF YOU DO NOT MEET THESE CRITERIA, YOU ARE CONSIDERED LOW RISK FOR COVID-19. ° °What to do if you are HIGH RISK for COVID-19? ° °If you are having a medical emergency, call 911. °Seek medical care right away. Before you go to a doctor’s office, urgent care or emergency department, ° call ahead and tell them about your recent travel, contact with someone diagnosed with COVID-19  ° and your symptoms.  °You should receive instructions from your physician’s office regarding  next steps of care.  °When you arrive at healthcare provider, tell the healthcare staff immediately you have returned from  °visiting [place], [place], [place], [place] or [place]; or traveled in the [place] to [place], [place],  °[place] or [place] in the last two weeks or you have been in close contact with a person diagnosed with  °COVID-19 in the last 2 weeks.   °Tell the health care staff about your symptoms: fever, cough and shortness of breath. °After you have been seen by a medical provider, you will be either: °Tested for (COVID-19) and discharged home on quarantine except to seek medical care if  °symptoms worsen, and asked to  °Stay home and avoid contact with others until you get your results (4-5 days)  °Avoid travel on public transportation if possible (such as bus, train, or airplane) or °Sent to the Emergency Department by EMS for evaluation, COVID-19 testing  and  °possible admission depending on your condition and test results. ° °What to do if you are LOW RISK for COVID-19? ° °Reduce your risk of any infection by using the same precautions used for avoiding the common cold or flu:  °Wash your hands often with soap and warm water for at least 20 seconds.  If soap and water are not readily available,  °use an alcohol-based hand sanitizer with at least 60% alcohol.  °If coughing or sneezing, cover your mouth and nose by coughing   or sneezing into the elbow areas of your shirt or coat, ° into a tissue or into your sleeve (not your hands). °Avoid shaking hands with others and consider head nods or verbal greetings only. °Avoid touching your eyes, nose, or mouth with unwashed hands.  °Avoid close contact with people who are sick. °Avoid places or events with large numbers of people in one location, like concerts or sporting events. °Carefully consider travel plans you have or are making. °If you are planning any travel outside or inside the [place], visit the CDC’s Travelers’ Health webpage for  the latest health notices. °If you have some symptoms but not all symptoms, continue to monitor at home and seek medical attention  °if your symptoms worsen. °If you are having a medical emergency, call 911. °>>>>>>>>>>>>>>>>>>>>>>>>>>>>>>>>>>>>>>>>>>>>>>>>>>> °We Do NOT Approve of [employer] °> > > > > > > > > > > > > > > > > > > > > > > > > > > > > > > > > > >  > >  ° ° °Preventive Care for Adults ° °A healthy lifestyle and preventive care can promote health and wellness. Preventive health guidelines for men include the following key practices: °A routine yearly physical is a good way to check with your health care provider about your health and preventative screening. It is a chance to share any concerns and updates on your health and to receive a thorough exam. °Visit your dentist for a routine exam and preventative care every 6 months. Brush your teeth twice a day and floss once a day. Good oral hygiene prevents tooth decay and gum disease. °The frequency of eye exams is based on your age, health, family medical history, use of contact lenses, and other factors. Follow your health care provider's recommendations for frequency of eye exams. °Eat a healthy diet. Foods such as vegetables, fruits, whole grains, low-fat dairy products, and lean protein foods contain the nutrients you need without too many calories. Decrease your intake of foods high in solid fats, added sugars, and salt. Eat the right amount of calories for you. Get information about a proper diet from your health care provider, if necessary. °Regular physical exercise is one of the most important things you can do for your health. Most adults should get at least 150 minutes of moderate-intensity exercise (any activity that increases your heart rate and causes you to sweat) each week. In addition, most adults need muscle-strengthening exercises on 2 or more days a week. °Maintain a healthy weight. The body mass index (BMI) is a screening  tool to identify possible weight problems. It provides an estimate of body fat based on height and weight. Your health care provider can find your BMI and can help you achieve or maintain a healthy weight. For adults 20 years and older: °A BMI below 18.5 is considered underweight. °A BMI of 18.5 to 24.9 is normal. °A BMI of 25 to 29.9 is considered overweight. °A BMI of 30 and above is considered obese. °Maintain normal blood lipids and cholesterol levels by exercising and minimizing your intake of saturated fat. Eat a balanced diet with plenty of fruit and vegetables. Blood tests for lipids and cholesterol should begin at age 20 and be repeated every 5 years. If your lipid or cholesterol levels are high, you are over 50, or you are at high risk for heart disease, you may need your cholesterol levels checked more frequently. Ongoing high lipid and cholesterol levels should be   treated with medicines if diet and exercise are not working. °If you smoke, find out from your health care provider how to quit. If you do not use tobacco, do not start. °Lung cancer screening is recommended for adults aged 55-80 years who are at high risk for developing lung cancer because of a history of smoking. A yearly low-dose CT scan of the lungs is recommended for people who have at least a 30-pack-year history of smoking and are a current smoker or have quit within the past 15 years. A pack year of smoking is smoking an average of 1 pack of cigarettes a day for 1 year (for example: 1 pack a day for 30 years or 2 packs a day for 15 years). Yearly screening should continue until the smoker has stopped smoking for at least 15 years. Yearly screening should be stopped for people who develop a health problem that would prevent them from having lung cancer treatment. °If you choose to drink alcohol, do not have more than 2 drinks per day. One drink is considered to be 12 ounces (355 mL) of beer, 5 ounces (148 mL) of wine, or 1.5 ounces (44  mL) of liquor. °Avoid use of street drugs. Do not share needles with anyone. Ask for help if you need support or instructions about stopping the use of drugs. °High blood pressure causes heart disease and increases the risk of stroke. Your blood pressure should be checked at least every 1-2 years. Ongoing high blood pressure should be treated with medicines, if weight loss and exercise are not effective. °If you are 45-79 years old, ask your health care provider if you should take aspirin to prevent heart disease. °Diabetes screening involves taking a blood sample to check your fasting blood sugar level. Testing should be considered at a younger age or be carried out more frequently if you are overweight and have at least 1 risk factor for diabetes. °Colorectal cancer can be detected and often prevented. Most routine colorectal cancer screening begins at the age of 50 and continues through age 75. However, your health care provider may recommend screening at an earlier age if you have risk factors for colon cancer. On a yearly basis, your health care provider may provide home test kits to check for hidden blood in the stool. Use of a small camera at the end of a tube to directly examine the colon (sigmoidoscopy or colonoscopy) can detect the earliest forms of colorectal cancer. Talk to your health care provider about this at age 50, when routine screening begins. Direct exam of the colon should be repeated every 5-10 years through age 75, unless early forms of precancerous polyps or small growths are found. °Hepatitis C blood testing is recommended for all people born from 1945 through 1965 and any individual with known risks for hepatitis C. °Screening for abdominal aortic aneurysm (AAA)  by ultrasound is recommended for people who have history of high blood pressure or who are current or former smokers. °Healthy men should  receive prostate-specific antigen (PSA) blood tests as part of routine cancer screening.  Talk with your health care provider about prostate cancer screening. °Testicular cancer screening is  recommended for adult males. Screening includes self-exam, a health care provider exam, and other screening tests. Consult with your health care provider about any symptoms you have or any concerns you have about testicular cancer. °Use sunscreen. Apply sunscreen liberally and repeatedly throughout the day. You should seek shade when your shadow is shorter than   you. Protect yourself by wearing long sleeves, pants, a wide-brimmed hat, and sunglasses year round, whenever you are outdoors. °Once a month, do a whole-body skin exam, using a mirror to look at the skin on your back. Tell your health care provider about new moles, moles that have irregular borders, moles that are larger than a pencil eraser, or moles that have changed in shape or color. °Stay current with required vaccines (immunizations). °Influenza vaccine. All adults should be immunized every year. °Tetanus, diphtheria, and acellular pertussis (Td, Tdap) vaccine. An adult who has not previously received Tdap or who does not know his vaccine status should receive 1 dose of Tdap. This initial dose should be followed by tetanus and diphtheria toxoids (Td) booster doses every 10 years. Adults with an unknown or incomplete history of completing a 3-dose immunization series with Td-containing vaccines should begin or complete a primary immunization series including a Tdap dose. Adults should receive a Td booster every 10 years. °Zoster vaccine. One dose is recommended for adults aged 60 years or older unless certain conditions are present. ° °PREVNAR - Pneumococcal 13-valent conjugate (PCV13) vaccine. When indicated, a person who is uncertain of his immunization history and has no record of immunization should receive the PCV13 vaccine. An adult aged 19 years or older who has certain medical conditions and has not been previously immunized should receive 1  dose of PCV13 vaccine. This PCV13 should be followed with a dose of pneumococcal polysaccharide (PPSV23) vaccine. The PPSV23 vaccine dose should be obtained 1 or more year(s)after the dose of PCV13 vaccine. An adult aged 19 years or older who has certain medical conditions and previously received 1 or more doses of PPSV23 vaccine should receive 1 dose of PCV13. The PCV13 vaccine dose should be obtained 1 or more years after the last PPSV23 vaccine dose. ° °PNEUMOVAX - Pneumococcal polysaccharide (PPSV23) vaccine. When PCV13 is also indicated, PCV13 should be obtained first. All adults aged 65 years and older should be immunized. An adult younger than age 65 years who has certain medical conditions should be immunized. Any person who resides in a nursing home or long-term care facility should be immunized. An adult smoker should be immunized. People with an immunocompromised condition and certain other conditions should receive both PCV13 and PPSV23 vaccines. People with human immunodeficiency virus (HIV) infection should be immunized as soon as possible after diagnosis. Immunization during chemotherapy or radiation therapy should be avoided. Routine use of PPSV23 vaccine is not recommended for American Indians, [place] Natives, or people younger than 65 years unless there are medical conditions that require PPSV23 vaccine. When indicated, people who have unknown immunization and have no record of immunization should receive PPSV23 vaccine. One-time revaccination 5 years after the first dose of PPSV23 is recommended for people aged 19-64 years who have chronic kidney failure, nephrotic syndrome, asplenia, or immunocompromised conditions. People who received 1-2 doses of PPSV23 before age 65 years should receive another dose of PPSV23 vaccine at age 65 years or later if at least 5 years have passed since the previous dose. Doses of PPSV23 are not needed for people immunized with PPSV23 at or after age 65  years. ° °Hepatitis A vaccine. Adults who wish to be protected from this disease, have certain high-risk conditions, work with hepatitis A-infected animals, work in hepatitis A research labs, or travel to or work in countries with a high rate of hepatitis A should be immunized. Adults who were previously unvaccinated and who anticipate close contact   with an international adoptee during the first 60 days after arrival in the [place] from a country with a high rate of hepatitis A should be immunized. ° °Hepatitis B vaccine. Adults should be immunized if they wish to be protected from this disease, have certain high-risk conditions, may be exposed to blood or other infectious body fluids, are household contacts or sex partners of hepatitis B positive people, are clients or workers in certain care facilities, or travel to or work in countries with a high rate of hepatitis B. ° °Preventive Service / Frequency ° °Ages 65 and over °Blood pressure check. °Lipid and cholesterol check. °Lung cancer screening. / Every year if you are aged 55-80 years and have a 30-pack-year history of smoking and currently smoke or have quit within the past 15 years. Yearly screening is stopped once you have quit smoking for at least 15 years or develop a health problem that would prevent you from having lung cancer treatment. °Fecal occult blood test (FOBT) of stool. You may not have to do this test if you get a colonoscopy every 10 years. °Flexible sigmoidoscopy** or colonoscopy.** / Every 5 years for a flexible sigmoidoscopy or every 10 years for a colonoscopy beginning at age 50 and continuing until age 75. °Hepatitis C blood test.** / For all people born from 1945 through 1965 and any individual with known risks for hepatitis C. °Abdominal aortic aneurysm (AAA) screening./ Screening current or former smokers or have Hypertension. °Skin self-exam. / Monthly. °Influenza vaccine. / Every year. °Tetanus, diphtheria, and acellular  pertussis (Tdap/Td) vaccine.** / 1 dose of Td every 10 years. ° °Zoster vaccine.** / 1 dose for adults aged 60 years or older. ° °       Pneumococcal 13-valent conjugate (PCV13) vaccine.  ° °Pneumococcal polysaccharide (PPSV23) vaccine.  ° °Hepatitis A vaccine.** / Consult your health care provider. °Hepatitis B vaccine.** / Consult your health care provider. °Screening for abdominal aortic aneurysm (AAA)  by ultrasound is recommended for people who have history of high blood pressure or who are current or former smokers. °++++++++++ °Recommend Adult Low Dose Aspirin or  °coated  Aspirin 81 mg daily  °To reduce risk of Colon Cancer 40 %, ° Skin Cancer 26 % ,  °Malignant Melanoma 46% ° and  °Pancreatic cancer 60% °++++++++++++++++++++++ °Vitamin D goal ° is between 70-100.  °Please make sure that you are taking your Vitamin D as directed.  °It is very important as a natural anti-inflammatory  °helping hair, skin, and nails, as well as reducing stroke and heart attack risk.  °It helps your bones and helps with mood. °It also decreases numerous cancer risks so please take it as directed.  °Low Vit D is associated with a 200-300% higher risk for CANCER  °and 200-300% higher risk for HEART   ATTACK  &  STROKE.   °...................................... °It is also associated with higher death rate at younger ages,  °autoimmune diseases like Rheumatoid arthritis, Lupus, Multiple Sclerosis.    °Also many other serious conditions, like depression, Alzheimer's °Dementia, infertility, muscle aches, fatigue, fibromyalgia - just to name a few. °++++++++++++++++++++++ °Recommend the book "The END of DIETING" by Dr Joel Fuhrman  °& the book "The END of DIABETES " by Dr Joel Fuhrman °At [employer] - get book & Audio CD's  °  Being diabetic has a  300% increased risk for heart attack, stroke, cancer, and alzheimer- type vascular dementia. It is very important that you work harder with diet by   avoiding all foods that are white. Avoid  white rice (brown & wild rice is OK), white potatoes (sweetpotatoes in moderation is OK), White bread or wheat bread or anything made out of white flour like bagels, donuts, rolls, buns, biscuits, cakes, pastries, cookies, pizza crust, and pasta (made from white flour & egg whites) - vegetarian pasta or spinach or wheat pasta is OK. Multigrain breads like Arnold's or [employer], or multigrain sandwich thins or flatbreads.  Diet, exercise and weight loss can reverse and cure diabetes in the early stages.  Diet, exercise and weight loss is very important in the control and prevention of complications of diabetes which affects every system in your body, ie. Brain - dementia/stroke, eyes - glaucoma/blindness, heart - heart attack/heart failure, kidneys - dialysis, stomach - gastric paralysis, intestines - malabsorption, nerves - severe painful neuritis, circulation - gangrene & loss of a leg(s), and finally cancer and Alzheimers. ° °  I recommend avoid fried & greasy foods,  sweets/candy, white rice (brown or wild rice or Quinoa is OK), white potatoes (sweet potatoes are OK) - anything made from white flour - bagels, doughnuts, rolls, buns, biscuits,white and wheat breads, pizza crust and traditional pasta made of white flour & egg white(vegetarian pasta or spinach or wheat pasta is OK).  Multi-grain bread is OK - like multi-grain flat bread or sandwich thins. Avoid alcohol in excess. Exercise is also important. ° °  Eat all the vegetables you want - avoid meat, especially red meat and dairy - especially cheese.  Cheese is the most concentrated form of trans-fats which is the worst thing to clog up our arteries. Veggie cheese is OK which can be found in the fresh produce section at [employer] or [employer] or [employer] ° °++++++++++++++++++++++ °DASH Eating Plan ° °DASH stands for "Dietary Approaches to Stop Hypertension."  ° °The DASH eating plan is a healthy eating plan that has been shown to reduce high  blood pressure (hypertension). Additional health benefits may include reducing the risk of type 2 diabetes mellitus, heart disease, and stroke. The DASH eating plan may also help with weight loss. °WHAT DO I NEED TO KNOW ABOUT THE DASH EATING PLAN? °For the DASH eating plan, you will follow these general guidelines: °Choose foods with a percent daily value for sodium of less than 5% (as listed on the food label). °Use salt-free seasonings or herbs instead of table salt or sea salt. °Check with your health care provider or pharmacist before using salt substitutes. °Eat lower-sodium products, often labeled as "lower sodium" or "no salt added." °Eat fresh foods. °Eat more vegetables, fruits, and low-fat dairy products. °Choose whole grains. Look for the word "whole" as the first word in the ingredient list. °Choose fish  °Limit sweets, desserts, sugars, and sugary drinks. °Choose heart-healthy fats. °Eat veggie cheese  °Eat more home-cooked food and less restaurant, buffet, and fast food. °Limit fried foods. °Cook foods using methods other than frying. °Limit canned vegetables. If you do use them, rinse them well to decrease the sodium. °When eating at a restaurant, ask that your food be prepared with less salt, or no salt if possible. °                  °   WHAT FOODS CAN I EAT? °Read Dr Joel Fuhrman's books on The End of Dieting & The End of Diabetes ° °Grains °Whole grain or whole wheat bread. Brown rice. Whole grain or whole wheat pasta. Quinoa, bulgur, and   whole grain cereals. Low-sodium cereals. Corn or whole wheat flour tortillas. Whole grain cornbread. Whole grain crackers. Low-sodium crackers. ° °Vegetables °Fresh or frozen vegetables (raw, steamed, roasted, or grilled). Low-sodium or reduced-sodium tomato and vegetable juices. Low-sodium or reduced-sodium tomato sauce and paste. Low-sodium or reduced-sodium canned vegetables.  ° °Fruits °All fresh, canned (in natural juice), or frozen fruits. ° °Protein  Products ° All fish and seafood.  Dried beans, peas, or lentils. Unsalted nuts and seeds. Unsalted canned beans. ° °Dairy °Low-fat dairy products, such as skim or 1% milk, 2% or reduced-fat cheeses, low-fat ricotta or cottage cheese, or plain low-fat yogurt. Low-sodium or reduced-sodium cheeses. ° °Fats and Oils °Tub margarines without trans fats. Light or reduced-fat mayonnaise and salad dressings (reduced sodium). Avocado. Safflower, olive, or canola oils. Natural peanut or almond butter. ° °Other °Unsalted popcorn and pretzels. °The items listed above may not be a complete list of recommended foods or beverages. Contact your dietitian for more options. ° °++++++++++++++++++++ ° °WHAT FOODS ARE NOT RECOMMENDED? °Grains/ White flour or wheat flour °White bread. White pasta. White rice. Refined cornbread. Bagels and croissants. Crackers that contain trans fat. ° °Vegetables ° °Creamed or fried vegetables. Vegetables in a . Regular canned vegetables. Regular canned tomato sauce and paste. Regular tomato and vegetable juices. ° °Fruits °Dried fruits. Canned fruit in light or heavy syrup. Fruit juice. ° °Meat and Other Protein Products °Meat in general - RED meat & White meat.  Fatty cuts of meat. Ribs, chicken wings, all processed meats as bacon, sausage, bologna, salami, fatback, hot dogs, bratwurst and packaged luncheon meats. ° °Dairy °Whole or 2% milk, cream, half-and-half, and cream cheese. Whole-fat or sweetened yogurt. Full-fat cheeses or blue cheese. Non-dairy creamers and whipped toppings. Processed cheese, cheese spreads, or cheese curds. ° °Condiments °Onion and garlic salt, seasoned salt, table salt, and sea salt. Canned and packaged gravies. Worcestershire sauce. Tartar sauce. Barbecue sauce. Teriyaki sauce. Soy sauce, including reduced sodium. Steak sauce. Fish sauce. Oyster sauce. Cocktail sauce. Horseradish. Ketchup and mustard. Meat flavorings and tenderizers. Bouillon cubes. Hot sauce. Tabasco sauce.  Marinades. Taco seasonings. Relishes. ° °Fats and Oils °Butter, stick margarine, lard, shortening and bacon fat. Coconut, palm kernel, or palm oils. Regular salad dressings. ° °Pickles and olives. Salted popcorn and pretzels. ° °The items listed above may not be a complete list of foods and beverages to avoid. ° ° °

## 2021-06-19 NOTE — Progress Notes (Addendum)
Comprehensive Evaluation & Examination  Future Appointments  Date Time Provider Department  06/20/2021  2:00 PM Unk Pinto, MD [facility]  09/08/2021             Wellness 11:30 AM Liane Comber, NP [facility]  06/26/2022  2:00 PM Unk Pinto, MD [facility]            This very nice 71 y.o.  [name]  presents for a comprehensive evaluation and management of multiple medical co-morbidities.  Patient has been followed for HTN, HLD, T2_NIDDM  Prediabetes and Vitamin D Deficiency.       Today patient is c/o typical sx's of OAB /urgency .        Patient is followed expectantly for labile HTN . Patient's BP has been controlled and today's BP is at goal -  136/74. Patient denies any cardiac symptoms as chest pain, palpitations, shortness of breath, dizziness or ankle swelling.       Patient's hyperlipidemia is controlled with diet and Rosuvastatin. Patient denies myalgias or other medication SE's. Last lipids were at goal except elevated Trig's :   Lab Results  Component Value Date   CHOL 141 03/21/2021   HDL 45 03/21/2021   LDLCALC 68 03/21/2021   TRIG 216 (H) 03/21/2021   CHOLHDL 3.1 03/21/2021         Patient is followed for prediabetes  and patient denies reactive hypoglycemic symptoms, visual blurring, diabetic polys or paresthesias. Last A1c was not at  goal :   Lab Results  Component Value Date   HGBA1C 5.8 (H) 03/21/2021          Finally, patient has history of Vitamin D Deficiency ("29" /2017) and last vitamin D was borderline elevated :   Lab Results  Component Value Date   VD25OH 107 (H) 03/21/2021     Current Outpatient Medications on File Prior to Visit  Medication Sig   amphetamine-dextroamphetamine (ADDERALL) 20 MG tablet Take  1/2 to 1 tablet  2 x / day as needed for Focus & Concentration   VITAMIN B-12 1000 MCG/ML injection Inject once a month   FLAXSEED OIL 1000 MG CAPS Take 2 capsules  daily.   meclizine  25 MG tablet Take 1/2 to 1 tablet 2  to 3 x /day as needed    meloxicam (15 MG tablet Take  1/2 to 1 tablet  Daily with food    Omega-3 FISH OIL 1000 MG  Takes 1 capsule daily   OTC allergy tablet Take 1 tablet daily.   pseudoephedrine  120 MG 12 hr tablet Take  1 tablet  2 x /day    rosuvastatin 20 MG tablet Take 1 tablet  every day  for Cholesterol   tiZANidine  4 MG tablet Take 0.5-1 tablets  every 8  hours as needed    TURMERIC P Take b daily.   venlafaxine XR  75 MG  Take  1 capsule  Daily                        Allergies  Allergen Reactions   Other     Bee stings    Past Medical History:  Diagnosis Date   GERD (gastroesophageal reflux disease)    Hypercholesterolemia    Hypertension    labile    Plantar fasciitis, right 12/07/2016   Vitamin D deficiency      Health Maintenance  Topic Date Due   Zoster Vaccines- Shingrix (1 of 2)  Never done   COVID-19 Vaccine (4 - Booster for Pfizer series) 04/12/2020   TETANUS/TDAP  01/04/2027   Pneumonia Vaccine 67+ Years old  Completed   Hepatitis C Screening  Completed   HPV VACCINES  Aged Out   INFLUENZA VACCINE  Discontinued     Immunization History  Administered Date(s) Administered   Influenza, High Dose   03/21/2021   PFIZER  SARS-COV-2 Vacc  05/24/2019, 06/19/2019, 02/16/2020   Pneumococcal  -13 04/29/2018   Pneumococcal  -23 08/18/2019   Td 01/03/2017    Last Colon -   11/16/2015 in [place] & was recommended 10 yr f/u - due Aug 2027     Past Surgical History:  Procedure Laterality Date   COLONOSCOPY N/A 2002,2007,2012   ROTATOR CUFF REPAIR Right 2015     Social History   Socioeconomic History   Marital status: Married      Spouse name: Neoma Laming   Number of children: 2 children   Occupational History  Retired Armed forces operational officer    Tobacco Use   Smoking status: Never   Smokeless tobacco: Never  Substance Use Topics   Alcohol use: Yes    Comment: drinks 2-3 beers a week   Drug use: No      ROS Constitutional: Denies fever,  chills, weight loss/gain, headaches, insomnia,  night sweats or change in appetite. Does c/o fatigue. Eyes: Denies redness, blurred vision, diplopia, discharge, itchy or watery eyes.  ENT: Denies discharge, congestion, post nasal drip, epistaxis, sore throat, earache, hearing loss, dental pain, Tinnitus, Vertigo, Sinus pain or snoring.  Cardio: Denies chest pain, palpitations, irregular heartbeat, syncope, dyspnea, diaphoresis, orthopnea, PND, claudication or edema Respiratory: denies cough, dyspnea, DOE, pleurisy, hoarseness, laryngitis or wheezing.  Gastrointestinal: Denies dysphagia, heartburn, reflux, water brash, pain, cramps, nausea, vomiting, bloating, diarrhea, constipation, hematemesis, melena, hematochezia, jaundice or hemorrhoids Genitourinary: Denies dysuria, frequency, discharge, hematuria or flank pain. Has urgency, nocturia x 2-3 & occasional hesitancy. Musculoskeletal: Denies arthralgia, myalgia, stiffness, Jt. Swelling, pain, limp or strain/sprain. Denies Falls. Skin: Denies puritis, rash, hives, warts, acne, eczema or change in skin lesion Neuro: No weakness, tremor, incoordination, spasms, paresthesia or pain Psychiatric: Denies confusion, memory loss or sensory loss. Denies Depression. Endocrine: Denies change in weight, skin, hair change, nocturia, and paresthesia, diabetic polys, visual blurring or hyper / hypo glycemic episodes.  Heme/Lymph: No excessive bleeding, bruising or enlarged lymph nodes.   Physical Exam  BP 136/74    Pulse 86    Temp 97.9 F (36.6 C)    Resp 16    Ht '5\' 7"'$  (1.702 m)    Wt 160 lb 12.8 oz (72.9 kg)    SpO2 96%    BMI 25.18 kg/m   General Appearance: Well nourished and well groomed and in no apparent distress.  Eyes: PERRLA, EOMs, conjunctiva no swelling or erythema, normal fundi and vessels. Sinuses: No frontal/maxillary tenderness ENT/Mouth: EACs patent / TMs  nl. Nares clear without erythema, swelling, mucoid exudates. Oral hygiene is good.  No erythema, swelling, or exudate. Tongue normal, non-obstructing. Tonsils not swollen or erythematous. Hearing normal.  Neck: Supple, thyroid not palpable. No bruits, nodes or JVD. Respiratory: Respiratory effort normal.  BS equal and clear bilateral without rales, rhonci, wheezing or stridor. Cardio: Heart sounds are normal with regular rate and rhythm and no murmurs, rubs or gallops. Peripheral pulses are normal and equal bilaterally without edema. No aortic or femoral bruits. Chest: symmetric with normal excursions and percussion.  Abdomen: Soft, with Nl bowel  sounds. Nontender, no guarding, rebound, hernias, masses, or organomegaly.  Lymphatics: Non tender without lymphadenopathy.  Musculoskeletal: Full ROM all peripheral extremities, joint stability, 5/5 strength, and normal gait. Skin: Warm and dry without rashes, lesions, cyanosis, clubbing or  ecchymosis.  Neuro: Cranial nerves intact, reflexes equal bilaterally. Normal muscle tone, no cerebellar symptoms. Sensation intact.  Pysch: Alert and oriented X 3 with normal affect, insight and judgment appropriate.   Assessment and Plan  1. Labile hypertension  - EKG 12-Lead - Urinalysis, Routine w reflex microscopic - Microalbumin / creatinine urine ratio - CBC with Differential/Platelet - COMPLETE METABOLIC PANEL WITH GFR - Magnesium - TSH  2. Hyperlipidemia, mixed  - EKG 12-Lead - Lipid panel - TSH  3. Abnormal glucose  - EKG 12-Lead - Hemoglobin A1c - Insulin, random  4. Vitamin D deficiency  - VITAMIN D 25 Hydroxy   5. Prediabetes  - EKG 12-Lead - Hemoglobin A1c - Insulin, random  6. Benign localized prostatic hyperplasia  with lower urinary tract symptoms (LUTS)  - given sx's of Myrbetriq 25 mg x #7  and 50 mg x # 14 to see if helps sx's of OAB.   - PSA  7. B12 deficiency  - Vitamin B12  8. Screening for colorectal cancer  - POC Hemoccult Bld/Stl   9. Prostate cancer screening  - PSA  10.  Screening for ischemic heart disease  - EKG 12-Lead  11. FH: hypertension  - EKG 12-Lead  12. Screening for AAA (aortic abdominal aneurysm)   13. Medication management  - Urinalysis, Routine w reflex microscopic - Microalbumin / creatinine urine ratio - Vitamin B12 - CBC with Differential/Platelet - COMPLETE METABOLIC PANEL WITH GFR - Magnesium - Lipid panel - TSH - Hemoglobin A1c - Insulin, random - VITAMIN D 25 Hydroxy            Patient was counseled in prudent diet, weight control to achieve/maintain BMI less than 25, BP monitoring, regular exercise and medications as discussed.  Discussed med effects and SE's. Routine screening labs and tests as requested with regular follow-up as recommended. Over 40 minutes of exam, counseling, chart review and high complex critical decision making was performed   Kirtland Bouchard, MD

## 2021-06-20 ENCOUNTER — Ambulatory Visit (INDEPENDENT_AMBULATORY_CARE_PROVIDER_SITE_OTHER): Payer: Medicare Other | Admitting: Internal Medicine

## 2021-06-20 ENCOUNTER — Other Ambulatory Visit: Payer: Self-pay

## 2021-06-20 ENCOUNTER — Encounter: Payer: Self-pay | Admitting: Internal Medicine

## 2021-06-20 VITALS — BP 136/74 | HR 86 | Temp 97.9°F | Resp 16 | Ht 67.0 in | Wt 160.8 lb

## 2021-06-20 DIAGNOSIS — R7303 Prediabetes: Secondary | ICD-10-CM

## 2021-06-20 DIAGNOSIS — E538 Deficiency of other specified B group vitamins: Secondary | ICD-10-CM | POA: Diagnosis not present

## 2021-06-20 DIAGNOSIS — Z8249 Family history of ischemic heart disease and other diseases of the circulatory system: Secondary | ICD-10-CM | POA: Diagnosis not present

## 2021-06-20 DIAGNOSIS — Z79899 Other long term (current) drug therapy: Secondary | ICD-10-CM

## 2021-06-20 DIAGNOSIS — N401 Enlarged prostate with lower urinary tract symptoms: Secondary | ICD-10-CM | POA: Diagnosis not present

## 2021-06-20 DIAGNOSIS — Z125 Encounter for screening for malignant neoplasm of prostate: Secondary | ICD-10-CM | POA: Diagnosis not present

## 2021-06-20 DIAGNOSIS — R7309 Other abnormal glucose: Secondary | ICD-10-CM | POA: Diagnosis not present

## 2021-06-20 DIAGNOSIS — E782 Mixed hyperlipidemia: Secondary | ICD-10-CM | POA: Diagnosis not present

## 2021-06-20 DIAGNOSIS — Z136 Encounter for screening for cardiovascular disorders: Secondary | ICD-10-CM | POA: Diagnosis not present

## 2021-06-20 DIAGNOSIS — R0989 Other specified symptoms and signs involving the circulatory and respiratory systems: Secondary | ICD-10-CM | POA: Diagnosis not present

## 2021-06-20 DIAGNOSIS — E559 Vitamin D deficiency, unspecified: Secondary | ICD-10-CM

## 2021-06-20 DIAGNOSIS — Z1211 Encounter for screening for malignant neoplasm of colon: Secondary | ICD-10-CM

## 2021-06-21 LAB — VITAMIN D 25 HYDROXY (VIT D DEFICIENCY, FRACTURES): Vit D, 25-Hydroxy: 92 ng/mL (ref 30–100)

## 2021-06-21 LAB — COMPLETE METABOLIC PANEL WITH GFR
AG Ratio: 1.7 (calc) (ref 1.0–2.5)
ALT: 15 U/L (ref 9–46)
AST: 18 U/L (ref 10–35)
Albumin: 4.3 g/dL (ref 3.6–5.1)
Alkaline phosphatase (APISO): 45 U/L (ref 35–144)
BUN: 20 mg/dL (ref 7–25)
CO2: 31 mmol/L (ref 20–32)
Calcium: 9.4 mg/dL (ref 8.6–10.3)
Chloride: 106 mmol/L (ref 98–110)
Creat: 1.07 mg/dL (ref 0.70–1.28)
Globulin: 2.5 g/dL (calc) (ref 1.9–3.7)
Glucose, Bld: 81 mg/dL (ref 65–99)
Potassium: 4.3 mmol/L (ref 3.5–5.3)
Sodium: 144 mmol/L (ref 135–146)
Total Bilirubin: 0.5 mg/dL (ref 0.2–1.2)
Total Protein: 6.8 g/dL (ref 6.1–8.1)
eGFR: 74 mL/min/{1.73_m2} (ref >=60)

## 2021-06-21 LAB — CBC WITH DIFFERENTIAL/PLATELET
Absolute Monocytes: 504 cells/uL (ref 200–950)
Basophils Absolute: 39 cells/uL (ref 0–200)
Basophils Relative: 0.7 %
Eosinophils Absolute: 291 cells/uL (ref 15–500)
Eosinophils Relative: 5.2 %
HCT: 40.9 % (ref 38.5–50.0)
Hemoglobin: 13.6 g/dL (ref 13.2–17.1)
Lymphs Abs: 1518 cells/uL (ref 850–3900)
MCH: 29.8 pg (ref 27.0–33.0)
MCHC: 33.3 g/dL (ref 32.0–36.0)
MCV: 89.7 fL (ref 80.0–100.0)
MPV: 10.4 fL (ref 7.5–12.5)
Monocytes Relative: 9 %
Neutro Abs: 3248 cells/uL (ref 1500–7800)
Neutrophils Relative %: 58 %
Platelets: 262 10*3/uL (ref 140–400)
RBC: 4.56 10*6/uL (ref 4.20–5.80)
RDW: 12.7 % (ref 11.0–15.0)
Total Lymphocyte: 27.1 %
WBC: 5.6 10*3/uL (ref 3.8–10.8)

## 2021-06-21 LAB — TSH: TSH: 2.34 mIU/L (ref 0.40–4.50)

## 2021-06-21 LAB — MICROALBUMIN / CREATININE URINE RATIO
Creatinine, Urine: 155 mg/dL (ref 20–320)
Microalb Creat Ratio: 3 mcg/mg creat (ref <30)
Microalb, Ur: 0.4 mg/dL

## 2021-06-21 LAB — URINALYSIS, ROUTINE W REFLEX MICROSCOPIC
Bilirubin Urine: NEGATIVE
Glucose, UA: NEGATIVE
Hgb urine dipstick: NEGATIVE
Ketones, ur: NEGATIVE
Leukocytes,Ua: NEGATIVE
Nitrite: NEGATIVE
Protein, ur: NEGATIVE
Specific Gravity, Urine: 1.024 (ref 1.001–1.035)
pH: 5.5 (ref 5.0–8.0)

## 2021-06-21 LAB — LIPID PANEL
Cholesterol: 158 mg/dL (ref <200)
HDL: 38 mg/dL — ABNORMAL LOW (ref >=40)
LDL Cholesterol (Calc): 88 mg/dL (calc)
Non-HDL Cholesterol (Calc): 120 mg/dL (calc) (ref <130)
Total CHOL/HDL Ratio: 4.2 (calc) (ref <5.0)
Triglycerides: 228 mg/dL — ABNORMAL HIGH (ref <150)

## 2021-06-21 LAB — HEMOGLOBIN A1C
Hgb A1c MFr Bld: 5.6 % of total Hgb (ref <5.7)
Mean Plasma Glucose: 114 mg/dL
eAG (mmol/L): 6.3 mmol/L

## 2021-06-21 LAB — PSA: PSA: 0.26 ng/mL (ref <=4.00)

## 2021-06-21 LAB — VITAMIN B12: Vitamin B-12: 1562 pg/mL — ABNORMAL HIGH (ref 200–1100)

## 2021-06-21 LAB — INSULIN, RANDOM: Insulin: 6.5 u[IU]/mL

## 2021-06-21 LAB — MAGNESIUM: Magnesium: 2.3 mg/dL (ref 1.5–2.5)

## 2021-06-21 NOTE — Progress Notes (Signed)
<><><><><><><><><><><><><><><><><><><><><><><><><><><><><><><><><> ?<><><><><><><><><><><><><><><><><><><><><><><><><><><><><><><><><> ?-   Test results slightly outside the reference range are not unusual. ?If there is anything important, I will review this with you,  ?otherwise it is considered normal test values.  ?If you have further questions,  ?please do not hesitate to contact me at the office or via My Chart.  ?<><><><><><><><><><><><><><><><><><><><><><><><><><><><><><><><><> ?<><><><><><><><><><><><><><><><><><><><><><><><><><><><><><><><><> ? ?-  Vitamin B12 level is very high, so may reduce dose to 1 x /week ?<><><><><><><><><><><><><><><><><><><><><><><><><><><><><><><><><> ?<><><><><><><><><><><><><><><><><><><><><><><><><><><><><><><><><> ? ?-  Total Chol = 158    &   LDL Chol = 88 - Both  Excellent  ? ?- Very low risk for Heart Attack  / Stroke ?============================================================ ?============================================================ ? ?-  But  Triglycerides (   228   ) or fats in blood are too high  ?(goal is less than 150)   ? ?- Recommend avoid fried & greasy foods,  sweets / candy,  ? ?- Avoid white rice  ?(brown or wild rice or Quinoa is OK),  ? ?- Avoid white potatoes  ?(sweet potatoes are OK)  ? ?- Avoid anything made from white flour  ?- bagels, doughnuts, rolls, buns, biscuits, white and  ? ?wheat breads, pizza crust and traditional  ?pasta made of white flour & egg white ? ?- (vegetarian pasta or spinach or wheat pasta is OK).   ? ?- Multi-grain bread is OK - like multi-grain flat bread or ? sandwich thins.  ? ?- Avoid alcohol in excess.  ? ?- Exercise is also important. ?<><><><><><><><><><><><><><><><><><><><><><><><><><><><><><><><><> ?<><><><><><><><><><><><><><><><><><><><><><><><><><><><><><><><><> ? ?-  PSA - very, very Low - Great   !   ?<><><><><><><><><><><><><><><><><><><><><><><><><><><><><><><><><> ?<><><><><><><><><><><><><><><><><><><><><><><><><><><><><><><><><> ? ?-  A1c = 5.6% - back down in Normal nonDiabetic range - Great !  ?<><><><><><><><><><><><><><><><><><><><><><><><><><><><><><><><><> ?<><><><><><><><><><><><><><><><><><><><><><><><><><><><><><><><><> ? ?-  Vitamin D = 92 - Excellent - Please  keep dose same  ?<><><><><><><><><><><><><><><><><><><><><><><><><><><><><><><><><> ?<><><><><><><><><><><><><><><><><><><><><><><><><><><><><><><><><> ? ?-  All Else - CBC - Kidneys - Electrolytes - Liver - Magnesium & Thyroid   ? ?- all  Normal / OK ?<><><><><><><><><><><><><><><><><><><><><><><><><><><><><><><><><> ?<><><><><><><><><><><><><><><><><><><><><><><><><><><><><><><><><> ? ?-  Keep up the [place] Work  ! ? ?<><><><><><><><><><><><><><><><><><><><><><><><><><><><><><><><><> ?<><><><><><><><><><><><><><><><><><><><><><><><><><><><><><><><><> ? ?-   ? ? ? ? ? ? ? ? ? ? ? ? ? ? ? ? ? ? ? ? ? ? ? ?

## 2021-07-09 ENCOUNTER — Encounter: Payer: Self-pay | Admitting: Internal Medicine

## 2021-07-11 ENCOUNTER — Encounter: Payer: Self-pay | Admitting: Adult Health

## 2021-07-12 ENCOUNTER — Other Ambulatory Visit: Payer: Self-pay | Admitting: Adult Health

## 2021-07-12 DIAGNOSIS — H2513 Age-related nuclear cataract, bilateral: Secondary | ICD-10-CM | POA: Diagnosis not present

## 2021-07-12 DIAGNOSIS — E782 Mixed hyperlipidemia: Secondary | ICD-10-CM

## 2021-07-12 MED ORDER — ROSUVASTATIN CALCIUM 20 MG PO TABS: ORAL_TABLET | ORAL | 3 refills | Status: DC

## 2021-07-13 ENCOUNTER — Other Ambulatory Visit: Payer: Self-pay

## 2021-07-13 DIAGNOSIS — Z1211 Encounter for screening for malignant neoplasm of colon: Secondary | ICD-10-CM

## 2021-07-13 LAB — POC HEMOCCULT BLD/STL (HOME/3-CARD/SCREEN)
Card #2 Fecal Occult Blod, POC: NEGATIVE
Card #3 Fecal Occult Blood, POC: NEGATIVE
Fecal Occult Blood, POC: NEGATIVE

## 2021-07-14 DIAGNOSIS — Z1211 Encounter for screening for malignant neoplasm of colon: Secondary | ICD-10-CM | POA: Diagnosis not present

## 2021-07-14 DIAGNOSIS — Z1212 Encounter for screening for malignant neoplasm of rectum: Secondary | ICD-10-CM | POA: Diagnosis not present

## 2021-08-02 ENCOUNTER — Encounter: Payer: Self-pay | Admitting: Internal Medicine

## 2021-08-02 ENCOUNTER — Other Ambulatory Visit: Payer: Self-pay | Admitting: Nurse Practitioner

## 2021-08-02 DIAGNOSIS — F332 Major depressive disorder, recurrent severe without psychotic features: Secondary | ICD-10-CM

## 2021-08-02 MED ORDER — VENLAFAXINE HCL ER 75 MG PO CP24: ORAL_CAPSULE | ORAL | 3 refills | Status: DC

## 2021-08-08 ENCOUNTER — Encounter: Payer: Self-pay | Admitting: Internal Medicine

## 2021-08-08 ENCOUNTER — Other Ambulatory Visit: Payer: Self-pay | Admitting: Internal Medicine

## 2021-08-08 MED ORDER — MIRABEGRON ER 25 MG PO TB24
ORAL_TABLET | ORAL | 3 refills | Status: DC
Start: 2021-08-08 — End: 2021-09-28

## 2021-08-31 ENCOUNTER — Other Ambulatory Visit: Payer: Self-pay | Admitting: Internal Medicine

## 2021-08-31 ENCOUNTER — Encounter: Payer: Self-pay | Admitting: Internal Medicine

## 2021-08-31 MED ORDER — DOXYCYCLINE HYCLATE 100 MG PO CAPS: ORAL_CAPSULE | ORAL | 3 refills | Status: DC

## 2021-09-08 ENCOUNTER — Ambulatory Visit: Payer: Medicare Other | Admitting: Adult Health

## 2021-09-27 NOTE — Progress Notes (Unsigned)
MEDICARE ANNUAL WELLNESS VISIT AND FOLLOW UP Assessment:   Diagnoses and all orders for this visit:  Encounter for Medicare annual wellness exam Due annually   Labile hypertension Currently at goal without medications Monitor blood pressure at home; call if consistently over 130/80 Continue DASH diet.   Reminder to go to the ER if any CP, SOB, nausea, dizziness, severe HA, changes vision/speech, left arm numbness and tingling and jaw pain. -CBC, CMP  Mixed hyperlipidemia Continue Rosuvastatin 20 mg Continue low cholesterol diet and exercise.  Check lipid panel, TSH  Other abnormal glucose (prediabetes) Discussed disease and risks Discussed diet/exercise, weight management  Monitor A1C q46m defer today  Allergic state, sequela Refilled epi pen for bee stings; reminded to follow up at ER   BMI 25, adult Continue to recommend diet heavy in fruits and veggies and low in animal meats, cheeses, and dairy products, appropriate calorie intake Discuss exercise recommendations routinely Continue to monitor weight at each visit - set goal to lose 10 lb this summer  Medication management Check CBC, CMP/GFR  B12 deficiency Not currently on supplementation - B12  Arthralgia right shoulder Meloxicam daily for the next 2 weeks and if symptoms worsen notify the office  Numbness/tingling bilateral lower extremities Mild; patient declines meds or further workup  Major depression in remission ([place] In remission on medication  Lifestyle discussed: diet/exerise, sleep hygiene, stress management, hydration  Vitamin D deficiency At goal at recent check; continue to recommend supplementation for goal of 70-100 Defer vitamin D level    Over 30 minutes of exam, counseling, chart review, and critical decision making was performed  Future Appointments  Date Time Provider [place] 01/04/2022  9:30 AM Francisco Pinto MD [facility] None  06/26/2022  2:00 PM Francisco Pinto  MD [facility] None  09/29/2022 10:30 AM WAlycia Rossetti NP [facility] None     Plan:   During the course of the visit the patient was educated and counseled about appropriate screening and preventive services including:   Pneumococcal vaccine  Influenza vaccine Prevnar 13 Td vaccine Screening electrocardiogram Colorectal cancer screening Diabetes screening Glaucoma screening Nutrition counseling    Subjective:  Francisco ZIRBESis a 71y.o. male who presents for Medicare Annual Wellness Visit and 3 month follow up for HTN, hyperlipidemia, prediabetes, and vitamin D Def.     he has a diagnosis of depression and has been in remission on effexor 75 mg daily.   He has persistent numbness/tingling of bilateral feet, not significantly limiting him. Questionably has been attributed to DDD seen on xray but he declines interventions or work up, managing with exercise and stretching, occasional NSAID/meloxicam. No recent falls  BMI is Body mass index is 25.4 kg/m., he has been working on diet and exercise (30 min exercise tape), very active building deck, installing kitchen cabinets, landscaping, etc, minimal sitting during the day.  Wt Readings from Last 3 Encounters:  09/28/21 162 lb 3.2 oz (73.6 kg)  06/20/21 160 lb 12.8 oz (72.9 kg)  05/17/21 162 lb 12.8 oz (73.8 kg)   His blood pressure has been controlled at home, today their BP is BP: 122/72  BP Readings from Last 3 Encounters:  09/28/21 122/72  06/20/21 136/74  05/17/21 (!) 147/85  He does workout. He denies chest pain, shortness of breath, dizziness.   Hurt right shoulder doing some exercises, has meloxicam but has not been taking regularly.  He is on cholesterol medication (crestor 20 mg every day)and denies myalgias. His cholesterol is  at goal. The cholesterol last visit was:   Lab Results  Component Value Date   CHOL 158 06/20/2021   HDL 38 (L) 06/20/2021   LDLCALC 88 06/20/2021   TRIG 228 (H) 06/20/2021    CHOLHDL 4.2 06/20/2021   He has been working on diet and exercise for prediabetes, and denies foot ulcerations, increased appetite, nausea, paresthesia of the feet, polydipsia, polyuria, visual disturbances, vomiting and weight loss. Last A1C in the office was:  Lab Results  Component Value Date   HGBA1C 5.6 06/20/2021   Last GFR Lab Results  Component Value Date   GFRNONAA 83 09/08/2020    Patient is on Vitamin D supplement and at goal at recent check:   Lab Results  Component Value Date   VD25OH 92 06/20/2021     He was on monthly B12 injections due to history of deficiency,he stopped B12 since it was elevated at last check Lab Results  Component Value Date   VITAMINB12 1,562 (H) 06/20/2021     Medication Review: Current Outpatient Medications on File Prior to Visit  Medication Sig Dispense Refill   amphetamine-dextroamphetamine (ADDERALL) 20 MG tablet Take  1/2 to 1 tablet  2 x / day as needed for Focus & Concentration 60 tablet 0   aspirin EC 81 MG tablet Take  1 tablet  Daily     Cholecalciferol (VITAMIN D3) 125 MCG (5000 UT) CAPS Take 2 capsules Daily     doxycycline (VIBRAMYCIN) 100 MG capsule Take  2 capsules  with Food for Tick bite prophylaxis 2 capsule 3   Flaxseed, Linseed, (FLAXSEED OIL) 1000 MG CAPS Take 2 capsules by mouth daily.     meclizine (ANTIVERT) 25 MG tablet Take 1/2 to 1 tablet 2 to 3 x /day as needed for Dizziness / Vertigo 90 tablet 11   meloxicam (MOBIC) 15 MG tablet Take  1/2 to 1 tablet  Daily with food for Pain & Inflammation - try limit to 5 days /week to avoid kidney damage 90 tablet 3   Omega-3 Fatty Acids (FISH OIL) 1000 MG CAPS Takes 1 capsule daily  0   OVER THE COUNTER MEDICATION OTC allergy tablet 1 tablet daily.     pseudoephedrine (SUDAFED) 120 MG 12 hr tablet Take  1 tablet  2 x /day (every 12 hours)  for Sinus & Chest Congestion 30 tablet 0   rosuvastatin (CRESTOR) 20 MG tablet Take 1 tablet  every day  for Cholesterol 90 tablet 3    tiZANidine (ZANAFLEX) 4 MG tablet Take 0.5-1 tablets (2-4 mg total) by mouth every 8 (eight) hours as needed for muscle spasms. 90 tablet 0   TURMERIC PO Take by mouth daily.     venlafaxine XR (EFFEXOR-XR) 75 MG 24 hr capsule Take  1 capsule  Daily  For Mood                                                      /                       TAKE  BY MOUTH 90 capsule 3   No current facility-administered medications on file prior to visit.    Allergies: Allergies  Allergen Reactions   Other     Bee stings    Current Problems (verified) has  Labile hypertension; Hyperlipidemia, mixed; Vitamin D deficiency; Medication management; Abnormal glucose; Allergy; Major depression in full remission ([place]); Numbness and tingling of both feet; B12 deficiency; and COVID-19 (12/16/2020) on their problem list.  Screening Tests Immunization History  Administered Date(s) Administered   Influenza, High Dose Seasonal PF 03/21/2021   PFIZER(Purple Top)SARS-COV-2 Vaccination 05/24/2019, 06/19/2019, 02/16/2020   Pneumococcal Conjugate-13 04/29/2018   Pneumococcal Polysaccharide-23 08/18/2019   Td 01/03/2017    Preventative care: Last colonoscopy:  August 2017, 10 year  Prior vaccinations: TD or Tdap: 01/2017  Influenza: declines Pneumococcal: 2021 Prevnar13: 04/2018 Shingles/Zostavax: declines Covid 19: 2/2, 2021, booster x1  Names of Other Physician/Practitioners you currently use: 1. [facility] here for primary care 2. Dr. Zenia Resides, eye doctor, last visit 05/2021, wears glasses 3.  Dr. ?, new dentist, last visit 05/2021, goes q68m has upcoming   Patient Care Team: Francisco Pinto MD as PCP - General (Internal Medicine)  Surgical: He  has a past surgical history that includes Colonoscopy (N/A, 2002,2007,2012) and Rotator cuff repair (Right, 2015). Family His family history is not on file. Social history  He reports that he has never smoked. He has never used  smokeless tobacco. He reports current alcohol use. He reports that he does not use drugs.  MEDICARE WELLNESS OBJECTIVES: Physical activity: Current Exercise Habits: Home exercise routine, Time (Minutes): 60, Frequency (Times/Week): 5, Weekly Exercise (Minutes/Week): 300, Intensity: Mild, Exercise limited by: None identified Cardiac risk factors: Cardiac Risk Factors include: advanced age (>544m, >6>91omen);dyslipidemia;hypertension;male gender Depression/mood screen:      09/28/2021   10:58 AM  Depression screen PHQ 2/9  Decreased Interest 0  Down, Depressed, Hopeless 0  PHQ - 2 Score 0    ADLs:     09/28/2021   10:59 AM 06/19/2021    9:11 PM  In your present state of health, do you have any difficulty performing the following activities:  Hearing? 0 0  Vision? 0 0  Difficulty concentrating or making decisions? 0 0  Walking or climbing stairs? 0 0  Dressing or bathing? 0 0  Doing errands, shopping? 0 0     Cognitive Testing  Alert? Yes  Normal Appearance?Yes  Oriented to person? Yes  Place? Yes   Time? Yes  Recall of three objects?  Yes  Can perform simple calculations? Yes  Displays appropriate judgment?Yes  Can read the correct time from a watch face?Yes  EOL planning: Does Patient Have a Medical Advance Directive?: Yes Type of Advance Directive: Healthcare Power of Attorney, Living will Does patient want to make changes to medical advance directive?: No - Patient declined Copy of [place] Chart?: No - copy requested   Objective:   Today's Vitals   09/28/21 1040  BP: 122/72  Pulse: 93  Temp: (!) 96.8 F (36 C)  SpO2: 99%  Weight: 162 lb 3.2 oz (73.6 kg)    Body mass index is 25.4 kg/m.  General appearance: alert, no distress, WD/WN, male HEENT: normocephalic, sclerae anicteric, TMs pearly, nares patent, no discharge or erythema, pharynx normal. HOH with bilateral hearing aids.  Oral cavity: MMM, no lesions Neck: supple, no  lymphadenopathy, no thyromegaly, no masses Heart: RRR, normal S1, S2, no murmurs Lungs: CTA bilaterally, no wheezes, rhonchi, or rales Abdomen: +bs, soft, non tender, non distended, no masses, no hepatomegaly, no splenomegaly Musculoskeletal: nontender, no swelling, no obvious deformity Extremities: no edema, no cyanosis, no clubbing Pulses: 2+ symmetric, upper and lower extremities, normal cap refill  Neurological: alert, oriented x 3, CN2-12 intact, strength normal upper extremities and lower extremities, sensation diminished bil feet/toes, DTRs 2+ throughout, no cerebellar signs, gait normal Psychiatric: normal affect, behavior normal, pleasant   Medicare Attestation I have personally reviewed: The patient's medical and social history Their use of alcohol, tobacco or illicit drugs Their current medications and supplements The patient's functional ability including ADLs,fall risks, home safety risks, cognitive, and hearing and visual impairment Diet and physical activities Evidence for depression or mood disorders  The patient's weight, height, BMI, and visual acuity have been recorded in the chart.  I have made referrals, counseling, and provided education to the patient based on review of the above and I have provided the patient with a written personalized care plan for preventive services.     Francisco Rossetti, NP   09/28/2021

## 2021-09-28 ENCOUNTER — Ambulatory Visit (INDEPENDENT_AMBULATORY_CARE_PROVIDER_SITE_OTHER): Payer: Medicare Other | Admitting: Nurse Practitioner

## 2021-09-28 ENCOUNTER — Encounter: Payer: Self-pay | Admitting: Nurse Practitioner

## 2021-09-28 VITALS — BP 122/72 | HR 93 | Temp 96.8°F | Wt 162.2 lb

## 2021-09-28 DIAGNOSIS — E782 Mixed hyperlipidemia: Secondary | ICD-10-CM | POA: Diagnosis not present

## 2021-09-28 DIAGNOSIS — R202 Paresthesia of skin: Secondary | ICD-10-CM | POA: Diagnosis not present

## 2021-09-28 DIAGNOSIS — T7840XS Allergy, unspecified, sequela: Secondary | ICD-10-CM

## 2021-09-28 DIAGNOSIS — E538 Deficiency of other specified B group vitamins: Secondary | ICD-10-CM

## 2021-09-28 DIAGNOSIS — R0989 Other specified symptoms and signs involving the circulatory and respiratory systems: Secondary | ICD-10-CM | POA: Diagnosis not present

## 2021-09-28 DIAGNOSIS — M2550 Pain in unspecified joint: Secondary | ICD-10-CM

## 2021-09-28 DIAGNOSIS — Z79899 Other long term (current) drug therapy: Secondary | ICD-10-CM

## 2021-09-28 DIAGNOSIS — Z0001 Encounter for general adult medical examination with abnormal findings: Secondary | ICD-10-CM

## 2021-09-28 DIAGNOSIS — R6889 Other general symptoms and signs: Secondary | ICD-10-CM

## 2021-09-28 DIAGNOSIS — Z6825 Body mass index (BMI) 25.0-25.9, adult: Secondary | ICD-10-CM

## 2021-09-28 DIAGNOSIS — R7309 Other abnormal glucose: Secondary | ICD-10-CM | POA: Diagnosis not present

## 2021-09-28 DIAGNOSIS — Z0000 Encounter for general adult medical examination without abnormal findings: Secondary | ICD-10-CM

## 2021-09-28 DIAGNOSIS — F3342 Major depressive disorder, recurrent, in full remission: Secondary | ICD-10-CM

## 2021-09-28 DIAGNOSIS — E559 Vitamin D deficiency, unspecified: Secondary | ICD-10-CM | POA: Diagnosis not present

## 2021-09-28 DIAGNOSIS — R200 Anesthesia of skin: Secondary | ICD-10-CM | POA: Diagnosis not present

## 2021-09-28 MED ORDER — MELOXICAM 15 MG PO TABS: ORAL_TABLET | ORAL | 3 refills | Status: DC

## 2021-09-29 LAB — COMPLETE METABOLIC PANEL WITH GFR
AG Ratio: 1.8 (calc) (ref 1.0–2.5)
ALT: 19 U/L (ref 9–46)
AST: 21 U/L (ref 10–35)
Albumin: 4.4 g/dL (ref 3.6–5.1)
Alkaline phosphatase (APISO): 50 U/L (ref 35–144)
BUN: 18 mg/dL (ref 7–25)
CO2: 30 mmol/L (ref 20–32)
Calcium: 9.8 mg/dL (ref 8.6–10.3)
Chloride: 106 mmol/L (ref 98–110)
Creat: 1.04 mg/dL (ref 0.70–1.28)
Globulin: 2.5 g/dL (calc) (ref 1.9–3.7)
Glucose, Bld: 85 mg/dL (ref 65–99)
Potassium: 4.2 mmol/L (ref 3.5–5.3)
Sodium: 142 mmol/L (ref 135–146)
Total Bilirubin: 0.5 mg/dL (ref 0.2–1.2)
Total Protein: 6.9 g/dL (ref 6.1–8.1)
eGFR: 77 mL/min/{1.73_m2} (ref >=60)

## 2021-09-29 LAB — LIPID PANEL
Cholesterol: 146 mg/dL (ref <200)
HDL: 41 mg/dL (ref >=40)
LDL Cholesterol (Calc): 79 mg/dL (calc)
Non-HDL Cholesterol (Calc): 105 mg/dL (calc) (ref <130)
Total CHOL/HDL Ratio: 3.6 (calc) (ref <5.0)
Triglycerides: 163 mg/dL — ABNORMAL HIGH (ref <150)

## 2021-09-29 LAB — CBC WITH DIFFERENTIAL/PLATELET
Absolute Monocytes: 449 cells/uL (ref 200–950)
Basophils Absolute: 61 cells/uL (ref 0–200)
Basophils Relative: 1.2 %
Eosinophils Absolute: 230 cells/uL (ref 15–500)
Eosinophils Relative: 4.5 %
HCT: 44.7 % (ref 38.5–50.0)
Hemoglobin: 14.6 g/dL (ref 13.2–17.1)
Lymphs Abs: 1499 cells/uL (ref 850–3900)
MCH: 29.3 pg (ref 27.0–33.0)
MCHC: 32.7 g/dL (ref 32.0–36.0)
MCV: 89.8 fL (ref 80.0–100.0)
MPV: 10.9 fL (ref 7.5–12.5)
Monocytes Relative: 8.8 %
Neutro Abs: 2861 cells/uL (ref 1500–7800)
Neutrophils Relative %: 56.1 %
Platelets: 235 10*3/uL (ref 140–400)
RBC: 4.98 10*6/uL (ref 4.20–5.80)
RDW: 12.5 % (ref 11.0–15.0)
Total Lymphocyte: 29.4 %
WBC: 5.1 10*3/uL (ref 3.8–10.8)

## 2021-09-29 LAB — TSH: TSH: 2.29 mIU/L (ref 0.40–4.50)

## 2021-09-29 LAB — VITAMIN B12: Vitamin B-12: 387 pg/mL (ref 200–1100)

## 2021-11-02 ENCOUNTER — Encounter: Payer: Self-pay | Admitting: Nurse Practitioner

## 2021-11-19 ENCOUNTER — Encounter: Payer: Self-pay | Admitting: Internal Medicine

## 2021-11-19 NOTE — Patient Instructions (Signed)
Shoulder Pain Many things can cause shoulder pain, including: An injury to the shoulder. Overuse of the shoulder. Arthritis. The source of the pain can be: Inflammation. An injury to the shoulder joint. An injury to a tendon, ligament, or bone. Follow these instructions at home: Pay attention to changes in your symptoms. Let your health care provider know about them. Follow these instructions to relieve your pain. If you have a sling: Wear the sling as told by your health care provider. Remove it only as told by your health care provider. Loosen the sling if your fingers tingle, become numb, or turn cold and blue. Keep the sling clean. If the sling is not waterproof: Do not let it get wet. Remove it to shower or bathe. Move your arm as little as possible, but keep your hand moving to prevent swelling. Managing pain, stiffness, and swelling If directed, put ice on the painful area: Put ice in a plastic bag. Place a towel between your skin and the bag. Leave the ice on for 20 minutes, 2-3 times per day. Stop applying ice if it does not help with the pain. Squeeze a soft ball or a foam pad as much as possible. This helps to keep the shoulder from swelling. It also helps to strengthen the arm. General instructions Take over-the-counter and prescription medicines only as told by your health care provider. Keep all follow-up visits as told by your health care provider. This is important. Contact a health care provider if: Your pain gets worse. Your pain is not relieved with medicines. New pain develops in your arm, hand, or fingers. Get help right away if: Your arm, hand, or fingers: Tingle. Become numb. Become swollen. Become painful. Turn white or blue. Summary Shoulder pain can be caused by an injury, overuse, or arthritis. Pay attention to changes in your symptoms. Let your health care provider know about them. This condition may be treated with a sling, ice, and pain  medicines. Contact your health care provider if the pain gets worse or new pain develops. Get help right away if your arm, hand, or fingers tingle or become numb, swollen, or painful. Keep all follow-up visits as told by your health care provider. This is important.

## 2021-11-19 NOTE — Progress Notes (Signed)
Future Appointments  Date Time Provider Department  11/21/2021 10:30 AM Unk Pinto, MD [facility]  12/29/2021            6 mo  3:30 PM Unk Pinto, MD [facility]  06/26/2022            cpe  2:00 PM Unk Pinto, MD [facility]  09/29/2022            wellness 10:30 AM Alycia Rossetti, NP [facility]    History of Present Illness:                                                  This very nice 71 y.o.  [name]  with HTN, HLD, T2_NIDDM  Prediabetes and Vitamin D Deficiency presents with concerns re: Rt shoulder pain . Patient relates hx/o Rt shoulder.  Rotator Cuff surgery about 8 years ago & did well until about 6 weeks ago gradual onset or Rt anterior shoulder pain especially with laying in bed Right side down.   Medications    rosuvastatin 20 MG tablet, Take 1 tablet  every day     pseudoephedrine 120 MG 12 hr tablet, Take  1 tablet  2 x /day    aspirin EC 81 MG tablet, Take  1 tablet  Daily   meloxicam  15 MG tablet, Take  1/2 to 1 tablet  Daily   ADDERALL 20 MG tablet, Take  1/2 to 1 tablet  2 x / day as needed    VITAMIN D 5000 u, Take 2 capsules Daily   doxycycline 100 MG capsule, Take  2 capsules  with Food for Tick bite prophylaxis   FLAXSEED OIL 1000 MG , Take 2 capsules daily.   meclizine 25 MG tablet, Take 1/2 to 1 tablet 2 to 3 x /day as needed    Omega-3 FISH OIL 1000 MG CAPS, Takes 1 capsule daily   OTC allergy; 1 tablet daily.   tiZANidine  4 MG tablet, Take 0.5-1 tablets every 8  hours as needed for muscle spasms.   TURMERIC , Take daily.   venlafaxine -XR) 75 MG 24 hr capsule, Take  1 capsule  Daily    Problem list He has Labile hypertension; Hyperlipidemia, mixed; Vitamin D deficiency; Medication management; Abnormal glucose; Allergy; Major depression in full remission ([place]); Numbness and tingling of both feet; B12 deficiency; and COVID-19 (12/16/2020) on their problem list.   Observations/Objective:  BP 124/72   Pulse 60   Temp 97.9 F (36.6 C)   Resp  16   Ht '5\' 7"'$  (1.702 m)   Wt 162 lb 12.8 oz (73.8 kg)   SpO2 96%   BMI 25.50 kg/m   Exam focused RUE.   MS- decreased internal /external rotation Rt shoulder with tenderness of Right anterior joint line.   Neuro -  Nl w/o focal abnormalities.  Assessment and Plan:   1. Chronic pain in right shoulder  - dexamethasone  4 MG tablet;  Take 1 tab 3 x day for 5 days, then 2 x day for 5 days, then 1 tab daily   Dispense: 30 tablet   Follow Up Instructions:        I discussed the assessment and treatment plan with the patient. The patient was provided an opportunity to ask questions and all were answered. The patient agreed with the  plan and demonstrated an understanding of the instructions.       The patient was advised to call back if the symptoms worsen or if the condition fails to improve as anticipated.  Advised if not significantly improve  to call for orthopedic referral.     Kirtland Bouchard, MD

## 2021-11-20 ENCOUNTER — Encounter: Payer: Self-pay | Admitting: Internal Medicine

## 2021-11-21 ENCOUNTER — Encounter: Payer: Self-pay | Admitting: Internal Medicine

## 2021-11-21 ENCOUNTER — Ambulatory Visit (INDEPENDENT_AMBULATORY_CARE_PROVIDER_SITE_OTHER): Payer: Medicare Other | Admitting: Internal Medicine

## 2021-11-21 VITALS — BP 124/72 | HR 60 | Temp 97.9°F | Resp 16 | Ht 67.0 in | Wt 162.8 lb

## 2021-11-21 DIAGNOSIS — G8929 Other chronic pain: Secondary | ICD-10-CM | POA: Diagnosis not present

## 2021-11-21 DIAGNOSIS — M25511 Pain in right shoulder: Secondary | ICD-10-CM

## 2021-11-21 MED ORDER — DEXAMETHASONE 4 MG PO TABS: ORAL_TABLET | ORAL | 0 refills | Status: DC

## 2021-12-28 NOTE — Progress Notes (Signed)
Future Appointments  Date Time Provider Department  12/29/2021                6 mo ov  3:30 PM Unk Pinto, MD [facility]  06/26/2022                cpe  2:00 PM Unk Pinto, MD [facility]  09/29/2022               wellness 10:30 AM Alycia Rossetti, NP [facility]     History of Present Illness:       This very nice 71 y.o. [name] presents for 6  month follow up with labile HTN, HLD, Pre-Diabetes and Vitamin D Deficiency.  Today patient also relates difficulty staying focused & concentrating. Relates easily becomes distracted while driving.        Patient is monitored for labile HTN & BP has been controlled at home. Today's BP is at goal - 124/70 . Patient has had no complaints of any cardiac type chest pain, palpitations, dyspnea / orthopnea / PND, dizziness, claudication, or dependent edema.       Hyperlipidemia is controlled with diet & Rosuvastatin. Patient denies myalgias or other med SE's. Last Lipids were at goal:  Lab Results  Component Value Date   CHOL 129 09/08/2020   HDL 48 09/08/2020   LDLCALC 62 09/08/2020   TRIG 104 09/08/2020   CHOLHDL 2.7 09/08/2020     Also, the patient has history of abnormal glucose & is monitored for glucose intolerance.  Patient has had no symptoms of reactive hypoglycemia, diabetic polys, paresthesias or visual blurring.  Last A1c was near goal:  Lab Results  Component Value Date   HGBA1C 5.7 (H) 06/10/2020        Further, the patient also has history of Vitamin D Deficiency ("29" /2017) and supplements vitamin D without any suspected side-effects. Last vitamin D was at goal:  Lab Results  Component Value Date   VD25OH 80 06/10/2020     Current Outpatient Medications:     amphetamine-dextroamphetamine 20 MG tablet, Take  1/2 to 1 tablet  2 x / day as needed for Focus    aspirin EC 81 5000 u, Take 2 capsules Daily   FLAXSEED OIL 1000 MG CAPS, Take 2 capsules daily   meclizine 25 MG tablet, Take 1/2 to 1 tablet 2 to 3  x /day as needed for Dizziness /Vertigo   meloxicam  15 MG tablet, Take  1/2 to 1 tablet  Daily with food for Pain    Omega-3 FISH OIL 1000 MG , Takes 1 capsule daily   rosuvastatin 20 MG tablet, Take 1 tablet  every day     venlafaxine -XR)75 MG 24 hr capsule, Take  1 capsule  Daily  For Mood    Allergies  Allergen Reactions   Other     Bee stings    PMHx:   Past Medical History:  Diagnosis Date   GERD (gastroesophageal reflux disease)    Hypercholesterolemia    Hypertension    labile    Plantar fasciitis, right 12/07/2016   Vitamin D deficiency     Immunization History  Administered Date(s) Administered   PFIZER SARS-COV-2 Vaccination 05/24/2019, 06/19/2019, 02/16/2020   Pneumococcal -13 04/29/2018   Pneumococcal -23 08/18/2019   Td 01/03/2017    Past Surgical History:  Procedure Laterality Date   COLONOSCOPY N/A 2002,2007,2012   ROTATOR CUFF REPAIR Right 2015    FHx:    Reviewed /  unchanged  SHx:    Reviewed / unchanged   Systems Review:  Constitutional: Denies fever, chills, wt changes, headaches, insomnia, fatigue, night sweats, change in appetite. Eyes: Denies redness, blurred vision, diplopia, discharge, itchy, watery eyes.  ENT: Denies discharge, congestion, post nasal drip, epistaxis, sore throat, earache, hearing loss, dental pain, tinnitus, vertigo, sinus pain, snoring.  CV: Denies chest pain, palpitations, irregular heartbeat, syncope, dyspnea, diaphoresis, orthopnea, PND, claudication or edema. Respiratory: denies cough, dyspnea, DOE, pleurisy, hoarseness, laryngitis, wheezing.  Gastrointestinal: Denies dysphagia, odynophagia, heartburn, reflux, water brash, abdominal pain or cramps, nausea, vomiting, bloating, diarrhea, constipation, hematemesis, melena, hematochezia  or hemorrhoids. Genitourinary: Denies dysuria, frequency, urgency, nocturia, hesitancy, discharge, hematuria or flank pain. Musculoskeletal: Denies arthralgias, myalgias, stiffness, jt.  swelling, pain, limping or strain/sprain.  Skin: Denies pruritus, rash, hives, warts, acne, eczema or change in skin lesion(s). Neuro: No weakness, tremor, incoordination, spasms, paresthesia or pain. Psychiatric: Denies confusion, memory loss or sensory loss. Endo: Denies change in weight, skin or hair change.  Heme/Lymph: No excessive bleeding, bruising or enlarged lymph nodes.  Physical Exam  BP 124/70   Pulse 78   Temp 97.9 F (36.6 C)   Resp 16   Ht '5\' 7"'$  (1.702 m)   Wt 162 lb 9.6 oz (73.8 kg)   SpO2 99%   BMI 25.47 kg/m   Appears  well nourished, well groomed  and in no distress.  Eyes: PERRLA, EOMs, conjunctiva no swelling or erythema. Sinuses: No frontal/maxillary tenderness ENT/Mouth: EAC's clear, TM's nl w/o erythema, bulging. Nares clear w/o erythema, swelling, exudates. Oropharynx clear without erythema or exudates. Oral hygiene is good. Tongue normal, non obstructing. Hearing intact.  Neck: Supple. Thyroid not palpable. Car 2+/2+ without bruits, nodes or JVD. Chest: Respirations nl with BS clear & equal w/o rales, rhonchi, wheezing or stridor.  Cor: Heart sounds normal w/ regular rate and rhythm without sig. murmurs, gallops, clicks or rubs. Peripheral pulses normal and equal  without edema.  Abdomen: Soft & bowel sounds normal. Non-tender w/o guarding, rebound, hernias, masses or organomegaly.  Lymphatics: Unremarkable.  Musculoskeletal: Full ROM all peripheral extremities, joint stability, 5/5 strength and normal gait.  Skin: Warm, dry without exposed rashes, lesions or ecchymosis apparent.  Neuro: Cranial nerves intact, reflexes equal bilaterally. Sensory-motor testing grossly intact. Tendon reflexes grossly intact.  Pysch: Alert & oriented x 3.  Insight and judgement nl & appropriate. No ideations.  Assessment and Plan:  1. Labile hypertension  - Continue medication, monitor blood pressure at home.  - Continue DASH diet.  Reminder to go to the ER if any CP,   SOB, nausea, dizziness, severe HA, changes vision/speech.   - CBC with Differential/Platelet - COMPLETE METABOLIC PANEL WITH GFR - Magnesium - TSH  2. Hyperlipidemia, mixed  - Continue diet/meds, exercise,& lifestyle modifications.  - Continue monitor periodic cholesterol/liver & renal functions    - Lipid panel - TSH  3. Abnormal glucose  - Continue diet, exercise  - Lifestyle modifications.  - Monitor appropriate labs   - Hemoglobin A1c - Insulin, random  4. Vitamin D deficiency  - Continue supplementation.   - VITAMIN D 25 Hydroxy   5. Medication management  - CBC with Differential/Platelet - COMPLETE METABOLIC PANEL WITH GFR - Magnesium - Lipid panel - TSH - Hemoglobin A1c - Insulin, random - VITAMIN D 25 Hydroxy          Discussed  regular exercise, BP monitoring, weight control to achieve/maintain BMI less than 25 and discussed med and SE's.  Recommended labs to assess and monitor clinical status with further disposition pending results of labs.  I discussed the assessment and treatment plan with the patient. The patient was provided an opportunity to ask questions and all were answered. The patient agreed with the plan and demonstrated an understanding of the instructions.  I provided over 30 minutes of exam, counseling, chart review and  complex critical decision making.        The patient was advised to call back or seek an in-person evaluation if the symptoms worsen or if the condition fails to improve as anticipated.   Kirtland Bouchard, MD

## 2021-12-28 NOTE — Patient Instructions (Signed)

## 2021-12-29 ENCOUNTER — Ambulatory Visit (INDEPENDENT_AMBULATORY_CARE_PROVIDER_SITE_OTHER): Payer: Medicare Other | Admitting: Internal Medicine

## 2021-12-29 ENCOUNTER — Encounter: Payer: Self-pay | Admitting: Internal Medicine

## 2021-12-29 VITALS — BP 124/70 | HR 78 | Temp 97.9°F | Resp 16 | Ht 67.0 in | Wt 162.6 lb

## 2021-12-29 DIAGNOSIS — E782 Mixed hyperlipidemia: Secondary | ICD-10-CM | POA: Diagnosis not present

## 2021-12-29 DIAGNOSIS — R0989 Other specified symptoms and signs involving the circulatory and respiratory systems: Secondary | ICD-10-CM | POA: Diagnosis not present

## 2021-12-29 DIAGNOSIS — Z79899 Other long term (current) drug therapy: Secondary | ICD-10-CM | POA: Diagnosis not present

## 2021-12-29 DIAGNOSIS — R7309 Other abnormal glucose: Secondary | ICD-10-CM | POA: Diagnosis not present

## 2021-12-29 DIAGNOSIS — E559 Vitamin D deficiency, unspecified: Secondary | ICD-10-CM | POA: Diagnosis not present

## 2021-12-30 LAB — COMPLETE METABOLIC PANEL WITH GFR
AG Ratio: 1.8 (calc) (ref 1.0–2.5)
ALT: 22 U/L (ref 9–46)
AST: 26 U/L (ref 10–35)
Albumin: 4.2 g/dL (ref 3.6–5.1)
Alkaline phosphatase (APISO): 53 U/L (ref 35–144)
BUN: 19 mg/dL (ref 7–25)
CO2: 28 mmol/L (ref 20–32)
Calcium: 9.5 mg/dL (ref 8.6–10.3)
Chloride: 105 mmol/L (ref 98–110)
Creat: 1.07 mg/dL (ref 0.70–1.28)
Globulin: 2.3 g/dL (calc) (ref 1.9–3.7)
Glucose, Bld: 91 mg/dL (ref 65–99)
Potassium: 4.4 mmol/L (ref 3.5–5.3)
Sodium: 141 mmol/L (ref 135–146)
Total Bilirubin: 0.4 mg/dL (ref 0.2–1.2)
Total Protein: 6.5 g/dL (ref 6.1–8.1)
eGFR: 74 mL/min/{1.73_m2} (ref >=60)

## 2021-12-30 LAB — CBC WITH DIFFERENTIAL/PLATELET
Absolute Monocytes: 625 cells/uL (ref 200–950)
Basophils Absolute: 69 cells/uL (ref 0–200)
Basophils Relative: 1.3 %
Eosinophils Absolute: 228 cells/uL (ref 15–500)
Eosinophils Relative: 4.3 %
HCT: 38.7 % (ref 38.5–50.0)
Hemoglobin: 13.3 g/dL (ref 13.2–17.1)
Lymphs Abs: 1617 cells/uL (ref 850–3900)
MCH: 30.2 pg (ref 27.0–33.0)
MCHC: 34.4 g/dL (ref 32.0–36.0)
MCV: 88 fL (ref 80.0–100.0)
MPV: 10 fL (ref 7.5–12.5)
Monocytes Relative: 11.8 %
Neutro Abs: 2761 cells/uL (ref 1500–7800)
Neutrophils Relative %: 52.1 %
Platelets: 414 10*3/uL — ABNORMAL HIGH (ref 140–400)
RBC: 4.4 10*6/uL (ref 4.20–5.80)
RDW: 12.8 % (ref 11.0–15.0)
Total Lymphocyte: 30.5 %
WBC: 5.3 10*3/uL (ref 3.8–10.8)

## 2021-12-30 LAB — HEMOGLOBIN A1C
Hgb A1c MFr Bld: 5.7 % of total Hgb — ABNORMAL HIGH (ref <5.7)
Mean Plasma Glucose: 117 mg/dL
eAG (mmol/L): 6.5 mmol/L

## 2021-12-30 LAB — TSH: TSH: 1.96 mIU/L (ref 0.40–4.50)

## 2021-12-30 LAB — LIPID PANEL
Cholesterol: 161 mg/dL (ref <200)
HDL: 39 mg/dL — ABNORMAL LOW (ref >=40)
LDL Cholesterol (Calc): 88 mg/dL (calc)
Non-HDL Cholesterol (Calc): 122 mg/dL (calc) (ref <130)
Total CHOL/HDL Ratio: 4.1 (calc) (ref <5.0)
Triglycerides: 256 mg/dL — ABNORMAL HIGH (ref <150)

## 2021-12-30 LAB — INSULIN, RANDOM: Insulin: 11.4 u[IU]/mL

## 2021-12-30 LAB — VITAMIN D 25 HYDROXY (VIT D DEFICIENCY, FRACTURES): Vit D, 25-Hydroxy: 107 ng/mL — ABNORMAL HIGH (ref 30–100)

## 2021-12-30 LAB — MAGNESIUM: Magnesium: 2.2 mg/dL (ref 1.5–2.5)

## 2021-12-31 ENCOUNTER — Encounter: Payer: Self-pay | Admitting: Internal Medicine

## 2022-01-04 ENCOUNTER — Ambulatory Visit: Payer: Medicare Other | Admitting: Internal Medicine

## 2022-02-21 ENCOUNTER — Ambulatory Visit (INDEPENDENT_AMBULATORY_CARE_PROVIDER_SITE_OTHER): Payer: Medicare Other | Admitting: Internal Medicine

## 2022-02-21 ENCOUNTER — Encounter: Payer: Self-pay | Admitting: Internal Medicine

## 2022-02-21 VITALS — BP 120/80 | HR 75 | Temp 97.6°F | Resp 16 | Ht 67.0 in | Wt 155.6 lb

## 2022-02-21 DIAGNOSIS — R202 Paresthesia of skin: Secondary | ICD-10-CM | POA: Diagnosis not present

## 2022-02-21 MED ORDER — GABAPENTIN 100 MG PO CAPS: ORAL_CAPSULE | ORAL | 0 refills | Status: DC

## 2022-02-21 MED ORDER — DEXAMETHASONE 4 MG PO TABS: ORAL_TABLET | ORAL | 0 refills | Status: DC

## 2022-02-21 NOTE — Progress Notes (Signed)
Future Appointments  Date Time Provider Department  12/29/2021                6 mo ov  3:30 PM Unk Pinto, MD [facility]  06/26/2022                cpe  2:00 PM Unk Pinto, MD [facility]  09/29/2022               wellness 10:30 AM Alycia Rossetti, NP [facility]   History of Present Illness:       This very nice 71 y.o. [name]  with labile HTN, HLD, Pre-Diabetes and Vitamin D Deficiency  presents with c/o 3-4 week hx/o sensations of vibrations in his Rt chest wall in the T4 dermatome in the peri areolar area  circumference size diameter of aprox. 4-6 inches. Sensations usu last less than 5 seconds and reoccur about 20 + times /day.    Current Outpatient Medications:     amphetamine-dextroamphetamine 20 MG tablet, Take  1/2 to 1 tablet  2 x / day as needed for Focus    aspirin EC 81 5000 u, Take 2 capsules Daily   FLAXSEED OIL 1000 MG CAPS, Take 2 capsules daily   meclizine 25 MG tablet, Take 1/2 to 1 tablet 2 to 3 x /day as needed for Dizziness /Vertigo   meloxicam  15 MG tablet, Take  1/2 to 1 tablet  Daily with food for Pain    Omega-3 FISH OIL 1000 MG , Takes 1 capsule daily   rosuvastatin 20 MG tablet, Take 1 tablet  every day     venlafaxine -XR)75 MG 24 hr capsule, Take  1 capsule  Daily  For Mood    Allergies  Allergen Reactions   Other     Bee stings    PMHx:   Past Medical History:  Diagnosis Date   GERD (gastroesophageal reflux disease)    Hypercholesterolemia    Hypertension    labile    Plantar fasciitis, right 12/07/2016   Vitamin D deficiency     Immunization History  Administered Date(s) Administered   PFIZER SARS-COV-2 Vaccination 05/24/2019, 06/19/2019, 02/16/2020   Pneumococcal -13 04/29/2018   Pneumococcal -23 08/18/2019   Td 01/03/2017    Past Surgical History:  Procedure Laterality Date   COLONOSCOPY N/A 2002,2007,2012   ROTATOR CUFF REPAIR Right 2015    FHx:    Reviewed / unchanged  SHx:    Reviewed / unchanged   Systems  Review:  Constitutional: Denies fever, chills, wt changes, headaches, insomnia, fatigue, night sweats, change in appetite. Eyes: Denies redness, blurred vision, diplopia, discharge, itchy, watery eyes.  ENT: Denies discharge, congestion, post nasal drip, epistaxis, sore throat, earache, hearing loss, dental pain, tinnitus, vertigo, sinus pain, snoring.  CV: Denies chest pain, palpitations, irregular heartbeat, syncope, dyspnea, diaphoresis, orthopnea, PND, claudication or edema. Respiratory: denies cough, dyspnea, DOE, pleurisy, hoarseness, laryngitis, wheezing.  Gastrointestinal: Denies dysphagia, odynophagia, heartburn, reflux, water brash, abdominal pain or cramps, nausea, vomiting, bloating, diarrhea, constipation, hematemesis, melena, hematochezia  or hemorrhoids. Genitourinary: Denies dysuria, frequency, urgency, nocturia, hesitancy, discharge, hematuria or flank pain. Musculoskeletal: Denies arthralgias, myalgias, stiffness, jt. swelling, pain, limping or strain/sprain.  Skin: Denies pruritus, rash, hives, warts, acne, eczema or change in skin lesion(s). Neuro: No weakness, tremor, incoordination, spasms, paresthesia or pain. Psychiatric: Denies confusion, memory loss or sensory loss. Endo: Denies change in weight, skin or hair change.  Heme/Lymph: No excessive bleeding, bruising or enlarged lymph nodes.  Physical  Exam  BP 124/70   Pulse 78   Temp 97.9 F (36.6 C)   Resp 16   Ht '5\' 7"'$  (1.702 m)   Wt 162 lb 9.6 oz (73.8 kg)   SpO2 99%   BMI 25.47 kg/m   Appears  well nourished, well groomed  and in no distress.  Eyes: PERRLA, EOMs, conjunctiva no swelling or erythema. Sinuses: No frontal/maxillary tenderness ENT/Mouth: EAC's clear, TM's nl w/o erythema, bulging. Nares clear w/o erythema, swelling, exudates. Oropharynx clear without erythema or exudates. Oral hygiene is good. Tongue normal, non obstructing. Hearing intact.  Neck: Supple. Thyroid not palpable. Car 2+/2+ without  bruits, nodes or JVD. Chest: Respirations nl with BS clear & equal w/o rales, rhonchi, wheezing or stridor.  Cor: Heart sounds normal w/ regular rate and rhythm without sig. murmurs, gallops, clicks or rubs. Peripheral pulses normal and equal  without edema.  Abdomen: Soft & bowel sounds normal. Non-tender w/o guarding, rebound, hernias, masses or organomegaly.  Lymphatics: Unremarkable.  Musculoskeletal: Full ROM all peripheral extremities, joint stability, 5/5 strength and normal gait.  Skin: Warm, dry without exposed rashes, lesions or ecchymosis apparent.  Neuro: Cranial nerves intact, reflexes equal bilaterally. Sensory-motor testing grossly intact. Tendon reflexes grossly intact.  Pysch: Alert & oriented x 3.  Insight and judgement nl & appropriate. No ideations.  Assessment and Plan:   1. Paresthesias / Dysthesias  - dexamethasone  4 MG tablet;   Take 1 tab 3 x day - 3 days, then 2 x day - 3 days, then 1 tab daily    Dispense: 20 tablet; Refill: 0  - gabapentin  100 MG capsule;  Take  1 to 3 capsules  3 x /day for Neuropathy Sensations   Dispense: 270 capsule; Refill: 0         Discussed  regular exercise, BP monitoring, weight control to achieve/maintain BMI less than 25 and discussed med and SE's. Recommended labs to assess and monitor clinical status with further disposition pending results of labs.  I discussed the assessment and treatment plan with the patient. The patient was provided an opportunity to ask questions and all were answered. The patient agreed with the plan and demonstrated an understanding of the instructions.  I provided over 30 minutes of exam, counseling, chart review and  complex critical decision making.        The patient was advised to call back or seek an in-person evaluation if the symptoms worsen or if the condition fails to improve as anticipated.   Kirtland Bouchard, MD

## 2022-02-27 ENCOUNTER — Encounter: Payer: Self-pay | Admitting: Internal Medicine

## 2022-02-28 DIAGNOSIS — J019 Acute sinusitis, unspecified: Secondary | ICD-10-CM | POA: Diagnosis not present

## 2022-02-28 DIAGNOSIS — J209 Acute bronchitis, unspecified: Secondary | ICD-10-CM | POA: Diagnosis not present

## 2022-03-15 NOTE — Progress Notes (Unsigned)
Assessment and Plan:  There are no diagnoses linked to this encounter.    Further disposition pending results of labs. Discussed med's effects and SE's.   Over 30 minutes of exam, counseling, chart review, and critical decision making was performed.   Future Appointments  Date Time Provider [place]  03/16/2022 10:00 AM Alycia Rossetti, NP [facility] None  06/26/2022  2:00 PM Unk Pinto, MD [facility] None  09/29/2022 10:30 AM Alycia Rossetti, NP [facility] None    ------------------------------------------------------------------------------------------------------------------   HPI There were no vitals taken for this visit. 71 y.o.male presents for  Past Medical History:  Diagnosis Date   GERD (gastroesophageal reflux disease)    Hypercholesterolemia    Hypertension    labile    Plantar fasciitis, right 12/07/2016   Vitamin D deficiency      Allergies  Allergen Reactions   Other     Bee stings    Current Outpatient Medications on File Prior to Visit  Medication Sig   amphetamine-dextroamphetamine (ADDERALL) 20 MG tablet Take  1/2 to 1 tablet  2 x / day as needed for Focus & Concentration   aspirin EC 81 MG tablet Take  1 tablet  Daily   Cholecalciferol (VITAMIN D3) 125 MCG (5000 UT) CAPS Take 2 capsules Daily   dexamethasone (DECADRON) 4 MG tablet Take 1 tab 3 x day - 3 days, then 2 x day - 3 days, then 1 tab daily   Flaxseed, Linseed, (FLAXSEED OIL) 1000 MG CAPS Take 2 capsules by mouth daily.   gabapentin (NEURONTIN) 100 MG capsule Take  1 to 3 capsules  3 x /day for Neuropathy Sensations   meclizine (ANTIVERT) 25 MG tablet Take 1/2 to 1 tablet 2 to 3 x /day as needed for Dizziness / Vertigo   meloxicam (MOBIC) 15 MG tablet Take  1/2 to 1 tablet  Daily with food for Pain & Inflammation - try limit to 5 days /week to avoid kidney damage   Omega-3 Fatty Acids (FISH OIL) 1000 MG CAPS Takes 1 capsule daily   rosuvastatin (CRESTOR) 20 MG tablet Take 1  tablet  every day  for Cholesterol   venlafaxine XR (EFFEXOR-XR) 75 MG 24 hr capsule Take  1 capsule  Daily  For Mood                                                      /                       TAKE  BY MOUTH   No current facility-administered medications on file prior to visit.    ROS: all negative except above.   Physical Exam:  There were no vitals taken for this visit.  General Appearance: Well nourished, in no apparent distress. Eyes: PERRLA, EOMs, conjunctiva no swelling or erythema Sinuses: No Frontal/maxillary tenderness ENT/Mouth: Ext aud canals clear, TMs without erythema, bulging. No erythema, swelling, or exudate on post pharynx.  Tonsils not swollen or erythematous. Hearing normal.  Neck: Supple, thyroid normal.  Respiratory: Respiratory effort normal, BS equal bilaterally without rales, rhonchi, wheezing or stridor.  Cardio: RRR with no MRGs. Brisk peripheral pulses without edema.  Abdomen: Soft, + BS.  Non tender, no guarding, rebound, hernias, masses. Lymphatics: Non tender without lymphadenopathy.  Musculoskeletal: Full ROM, 5/5 strength,  normal gait.  Skin: Warm, dry without rashes, lesions, ecchymosis.  Neuro: Cranial nerves intact. Normal muscle tone, no cerebellar symptoms. Sensation intact.  Psych: Awake and oriented X 3, normal affect, Insight and Judgment appropriate.     Alycia Rossetti, NP 9:19 AM [facility]

## 2022-03-16 ENCOUNTER — Encounter: Payer: Self-pay | Admitting: Nurse Practitioner

## 2022-03-16 ENCOUNTER — Ambulatory Visit
Admission: RE | Admit: 2022-03-16 | Discharge: 2022-03-16 | Disposition: A | Discharge status: Home / Self Care | Payer: Medicare Other | Source: Ambulatory Visit | Attending: Nurse Practitioner | Admitting: Nurse Practitioner

## 2022-03-16 ENCOUNTER — Ambulatory Visit (INDEPENDENT_AMBULATORY_CARE_PROVIDER_SITE_OTHER): Payer: Medicare Other | Admitting: Nurse Practitioner

## 2022-03-16 VITALS — BP 118/72 | HR 66 | Temp 97.3°F | Ht 67.0 in | Wt 158.6 lb

## 2022-03-16 DIAGNOSIS — R0989 Other specified symptoms and signs involving the circulatory and respiratory systems: Secondary | ICD-10-CM

## 2022-03-16 DIAGNOSIS — R0789 Other chest pain: Secondary | ICD-10-CM | POA: Diagnosis not present

## 2022-03-16 DIAGNOSIS — R202 Paresthesia of skin: Secondary | ICD-10-CM

## 2022-03-16 DIAGNOSIS — R200 Anesthesia of skin: Secondary | ICD-10-CM

## 2022-03-16 NOTE — Patient Instructions (Signed)
I have ordered a chest xray at [facility] [place] Can walk in M-F 8:30-3:45  Referral is being placed to neurology  Increase gabapentin to 2 tabs twice a day

## 2022-04-12 ENCOUNTER — Encounter: Payer: Self-pay | Admitting: Nurse Practitioner

## 2022-05-22 ENCOUNTER — Ambulatory Visit: Payer: Medicare Other | Admitting: Neurology

## 2022-06-08 DIAGNOSIS — H2513 Age-related nuclear cataract, bilateral: Secondary | ICD-10-CM | POA: Diagnosis not present

## 2022-06-25 ENCOUNTER — Encounter: Payer: Self-pay | Admitting: Internal Medicine

## 2022-06-25 NOTE — Progress Notes (Signed)
Comprehensive Evaluation & Examination  Future Appointments  Date Time Provider Department  06/26/2022                   cpe  2:00 PM Francisco Cowboy, MD [facility]  09/29/2022                  wellness 10:30 AM Francisco Dick, NP [facility]  07/02/2023                  cpe  2:00 PM Francisco Cowboy, MD [facility]         This very nice 72 y.o.  [name]  presents for a comprehensive evaluation and management of multiple medical co-morbidities.  Patient has been followed for labile HTN, HLD, Prediabetes and Vitamin D Deficiency.        Patient is followed expectantly for labile HTN .  Patient's BP has been controlled and today's BP is at goal  -  110/70.  Patient denies any cardiac symptoms as chest pain, palpitations, shortness of breath, dizziness or ankle swelling.        Patient's hyperlipidemia is controlled with diet and Rosuvastatin. Patient denies myalgias or other medication SE's. Last lipids were at goal except elevated Trig's :   Lab Results  Component Value Date   CHOL 141 03/21/2021   HDL 45 03/21/2021   LDLCALC 68 03/21/2021   TRIG 216 (H) 03/21/2021   CHOLHDL 3.1 03/21/2021         Patient is followed for glucose intolerance and patient denies reactive hypoglycemic symptoms, visual blurring, diabetic polys or paresthesias. Last A1c was not at  goal :   Lab Results  Component Value Date   HGBA1C 5.8 (H) 03/21/2021          Patient has hx/o low Vit B12 of  183  in 2020.  Finally, patient has history of Vitamin D Deficiency ("29" /2017) and last vitamin D was borderline elevated :   Lab Results  Component Value Date   VD25OH 107 (H) 03/21/2021     Current Outpatient Medications  Medication Instructions   albuterol  HFA inhaler    ADDERALL 20 MG tablet Take  1/2 to 1 tablet  2 x / day as needed    aspirin EC 81 MG tablet Take  1 tablet  Daily   VITAMIN D 5000 u Take 2 capsules Daily   FLAXSEED OIL 1000 MG CAPS 2 capsules, Oral, Daily   gabapentin   100 MG capsule Take  1 to 3 capsules  3 x /day for Neuropathy    meclizine  25 MG tablet Take 1/2 to 1 tablet 2 to 3 x /day as needed    meloxicam 15 MG tablet Take  1/2 to 1 tablet  Daily    Omega-3 FISH OIL 1000 MG CAPS Takes 1 capsule daily   rosuvastatin  20 MG tablet Take 1 tablet  every day  for Cholesterol   venlafaxine -XR 75 MG 24 hr capsule Take  1 capsule  Daily       Allergies  Allergen Reactions   Other     Bee stings    Past Medical History:  Diagnosis Date   GERD (gastroesophageal reflux disease)    Hypercholesterolemia    Hypertension    labile    Plantar fasciitis, right 12/07/2016   Vitamin D deficiency      Health Maintenance  Topic Date Due   Zoster Vaccines- Shingrix (1 of 2)  Never done   COVID-19 Vaccine (4 - Booster for Pfizer series) 04/12/2020   TETANUS/TDAP  01/04/2027   Pneumonia Vaccine 61+ Years old  Completed   Hepatitis C Screening  Completed   HPV VACCINES  Aged Out     Immunization History  Administered Date(s) Administered   Influenza, High Dose   03/21/2021   PFIZER  SARS-COV-2 Vacc  05/24/2019, 06/19/2019, 02/16/2020   Pneumococcal  - 13 04/29/2018   Pneumococcal  - 23 08/18/2019   Td 01/03/2017    Last Colon -   11/16/2015 in [place] & was recommended 10 yr f/u - due Aug 2027     Past Surgical History:  Procedure Laterality Date   COLONOSCOPY N/A 2002,2007,2012   ROTATOR CUFF REPAIR Right 2015     Social History   Socioeconomic History   Marital status: Married      Spouse name: Francisco Dixon   Number of children: 2 children   Occupational History  Retired Nurse, adult    Tobacco Use   Smoking status: Never   Smokeless tobacco: Never  Substance Use Topics   Alcohol use: Yes    Comment: drinks 2-3 beers a week   Drug use: No      ROS Constitutional: Denies fever, chills, weight loss/gain, headaches, insomnia,  night sweats or change in appetite. Does c/o fatigue. Eyes: Denies redness, blurred vision,  diplopia, discharge, itchy or watery eyes.  ENT: Denies discharge, congestion, post nasal drip, epistaxis, sore throat, earache, hearing loss, dental pain, Tinnitus, Vertigo, Sinus pain or snoring.  Cardio: Denies chest pain, palpitations, irregular heartbeat, syncope, dyspnea, diaphoresis, orthopnea, PND, claudication or edema Respiratory: denies cough, dyspnea, DOE, pleurisy, hoarseness, laryngitis or wheezing.  Gastrointestinal: Denies dysphagia, heartburn, reflux, water brash, pain, cramps, nausea, vomiting, bloating, diarrhea, constipation, hematemesis, melena, hematochezia, jaundice or hemorrhoids Genitourinary: Denies dysuria, frequency, discharge, hematuria or flank pain. Has urgency, nocturia x 2-3 & occasional hesitancy. Musculoskeletal: Denies arthralgia, myalgia, stiffness, Jt. Swelling, pain, limp or strain/sprain. Denies Falls. Skin: Denies puritis, rash, hives, warts, acne, eczema or change in skin lesion Neuro: No weakness, tremor, incoordination, spasms, paresthesia or pain Psychiatric: Denies confusion, memory loss or sensory loss. Denies Depression. Endocrine: Denies change in weight, skin, hair change, nocturia, and paresthesia, diabetic polys, visual blurring or hyper / hypo glycemic episodes.  Heme/Lymph: No excessive bleeding, bruising or enlarged lymph nodes.   Physical Exam  BP 110/70   Pulse 79   Temp 97.9 F (36.6 C)   Resp 16   Ht 5\' 7"  (1.702 m)   Wt 161 lb 12.8 oz (73.4 kg)   SpO2 97%   BMI 25.34 kg/m   General Appearance: Well nourished and well groomed and in no apparent distress.  Eyes: PERRLA, EOMs, conjunctiva no swelling or erythema, normal fundi and vessels. Sinuses: No frontal/maxillary tenderness ENT/Mouth: EACs patent /TMs  nl. Nares clear without erythema, swelling, mucoid exudates. Oral hygiene is good. No erythema, swelling, or exudate. Tongue normal, non-obstructing. Tonsils not swollen or erythematous. Hearing normal.  Neck: Supple, thyroid  not palpable. No bruits, nodes or JVD. Respiratory: Respiratory effort normal.  BS equal and clear bilateral without rales, rhonci, wheezing or stridor. Cardio: Heart sounds are normal with regular rate and rhythm and no murmurs, rubs or gallops. Peripheral pulses are normal and equal bilaterally without edema. No aortic or femoral bruits. Chest: symmetric with normal excursions and percussion.  Abdomen: Soft, with Nl bowel sounds. Nontender, no guarding, rebound, hernias, masses, or organomegaly.  Lymphatics: Non  tender without lymphadenopathy.  Musculoskeletal: Full ROM all peripheral extremities, joint stability, 5/5 strength, and normal gait. Skin: Warm and dry without rashes, lesions, cyanosis, clubbing or  ecchymosis.  Neuro: Cranial nerves intact, reflexes equal bilaterally. Normal muscle tone, no cerebellar symptoms. Sensation intact.  Pysch: Alert and oriented X 3 with normal affect, insight and judgment appropriate.   Assessment and Plan   1. Labile hypertension  - EKG 12-Lead - Urinalysis, Routine w reflex microscopic - Microalbumin / creatinine urine ratio - CBC with Differential/Platelet - COMPLETE METABOLIC PANEL WITH GFR - Magnesium - TSH  2. Hyperlipidemia, mixed  - EKG 12-Lead - Lipid panel - TSH  3. Abnormal glucose  - EKG 12-Lead - Hemoglobin A1c - Insulin, random  4. Vitamin D deficiency  - VITAMIN D 25 Hydroxy  5. Benign localized prostatic hyperplasia with lower urinary tract symptoms (LUTS)  - PSA  6. B12 deficiency  - Methylmalonic acid, serum - Vitamin B12  7. Prostate cancer screening  - PSA  8. Screening for colorectal cancer  - Cologuard  9. Screening for heart disease  - EKG 12-Lead  10. FHx: heart disease  - EKG 12-Lead  11. Screening for AAA (aortic abdominal aneurysm)   12. Medication management  - Urinalysis, Routine w reflex microscopic - Microalbumin / creatinine urine ratio - Methylmalonic acid, serum -  TSH - Hemoglobin A1c - Insulin, random - VITAMIN D 25 Hydroxy  - Vitamin B12           Patient was counseled in prudent diet, weight control to achieve/maintain BMI less than 25, BP monitoring, regular exercise and medications as discussed.  Discussed med effects and SE's. Routine screening labs and tests as requested with regular follow-up as recommended. Over 40 minutes of exam, counseling, chart review and high complex critical decision making was performed   Francisco Maw, MD

## 2022-06-25 NOTE — Patient Instructions (Signed)

## 2022-06-26 ENCOUNTER — Ambulatory Visit (INDEPENDENT_AMBULATORY_CARE_PROVIDER_SITE_OTHER): Payer: Medicare Other | Admitting: Internal Medicine

## 2022-06-26 ENCOUNTER — Encounter: Payer: Self-pay | Admitting: Internal Medicine

## 2022-06-26 VITALS — BP 110/70 | HR 79 | Temp 97.9°F | Resp 16 | Ht 67.0 in | Wt 161.8 lb

## 2022-06-26 DIAGNOSIS — Z79899 Other long term (current) drug therapy: Secondary | ICD-10-CM

## 2022-06-26 DIAGNOSIS — Z1211 Encounter for screening for malignant neoplasm of colon: Secondary | ICD-10-CM

## 2022-06-26 DIAGNOSIS — E782 Mixed hyperlipidemia: Secondary | ICD-10-CM

## 2022-06-26 DIAGNOSIS — R7309 Other abnormal glucose: Secondary | ICD-10-CM

## 2022-06-26 DIAGNOSIS — Z136 Encounter for screening for cardiovascular disorders: Secondary | ICD-10-CM | POA: Diagnosis not present

## 2022-06-26 DIAGNOSIS — E559 Vitamin D deficiency, unspecified: Secondary | ICD-10-CM | POA: Diagnosis not present

## 2022-06-26 DIAGNOSIS — E538 Deficiency of other specified B group vitamins: Secondary | ICD-10-CM

## 2022-06-26 DIAGNOSIS — Z125 Encounter for screening for malignant neoplasm of prostate: Secondary | ICD-10-CM | POA: Diagnosis not present

## 2022-06-26 DIAGNOSIS — N401 Enlarged prostate with lower urinary tract symptoms: Secondary | ICD-10-CM | POA: Diagnosis not present

## 2022-06-26 DIAGNOSIS — R0989 Other specified symptoms and signs involving the circulatory and respiratory systems: Secondary | ICD-10-CM | POA: Diagnosis not present

## 2022-06-26 DIAGNOSIS — Z8249 Family history of ischemic heart disease and other diseases of the circulatory system: Secondary | ICD-10-CM

## 2022-06-27 NOTE — Progress Notes (Signed)
<><><><><><><><><><><><><><><><><><><><><><><><><><><><><><><><><> <><><><><><><><><><><><><><><><><><><><><><><><><><><><><><><><><> - Test results slightly outside the reference range are not unusual. If there is anything important, I will review this with you,  otherwise it is considered normal test values.  If you have further questions,  please do not hesitate to contact me at the office or via My Chart.  <><><><><><><><><><><><><><><><><><><><><><><><><><><><><><><><><> <><><><><><><><><><><><><><><><><><><><><><><><><><><><><><><><><>  -  Chol = 169  & LDL Chol = 82  - Both  Excellent   - Very low risk for Heart Attack  / Stroke <><><><><><><><><><><><><><><><><><><><><><><><><><><><><><><><><>  -  But Triglycerides ( = 388     ) or fats in blood are too high                 (   Ideal or  Goal is less than 150  !  )    - Recommend avoid fried & greasy foods,  sweets / candy,   - Avoid white rice  (brown or wild rice or Quinoa is OK),   - Avoid white potatoes  (sweet potatoes are OK)   - Avoid anything made from white flour  - bagels, doughnuts, rolls, buns, biscuits, white and   wheat breads, pizza crust and traditional  pasta made of white flour & egg white  - (vegetarian pasta or spinach or wheat pasta is OK).    - Multi-grain bread is OK - like multi-grain flat bread or  sandwich thins.   - Avoid alcohol in excess.   - Exercise is also important. <><><><><><><><><><><><><><><><><><><><><><><><><><><><><><><><><>  -  A1c = 5.8% - Blood sugar and A1c are                                   STILL elevated in the borderline and                                                               early or pre-diabetes range which has the same   300% increased risk for heart attack, stroke, cancer and                                                 alzheimer- type vascular dementia as full blown diabetes.   But the good news is that diet, exercise with                                                           weight loss can cure the early diabetes at this point. <><><><><><><><><><><><><><><><><><><><><><><><><><><><><><><><><>  -  PSA - Low - No Prostate Cancer - Great    !  <><><><><><><><><><><><><><><><><><><><><><><><><><><><><><><><><>  -  Vitamin D = 92 - Excellent  - Please keep dose same <><><><><><><><><><><><><><><><><><><><><><><><><><><><><><><><><>   -  Vitamin B12 =388  Very Low    (Ideal or Goal Vit B12 is between 450 - 1,100)   Low Vit B12 may be associated with Anemia , Fatigue,   Peripheral Neuropathy, Dementia, "Brain Fog", &  Depression  - Recommend take a sub-lingual form of Vitamin B12 tablet   1,000 to 5,000 mcg tab that you dissolve under your tongue /Daily   - Can get Baron Sane - best price at [employer] or on [employer] <><><><><><><><><><><><><><><><><><><><><><><><><><><><><><><><><> <><><><><><><><><><><><><><><><><><><><><><><><><><><><><><><><><>

## 2022-06-29 LAB — URINALYSIS, ROUTINE W REFLEX MICROSCOPIC
Bilirubin Urine: NEGATIVE
Glucose, UA: NEGATIVE
Hgb urine dipstick: NEGATIVE
Ketones, ur: NEGATIVE
Leukocytes,Ua: NEGATIVE
Nitrite: NEGATIVE
Protein, ur: NEGATIVE
Specific Gravity, Urine: 1.021 (ref 1.001–1.035)
pH: <=5 (ref 5.0–8.0)

## 2022-06-29 LAB — CBC WITH DIFFERENTIAL/PLATELET
Absolute Monocytes: 451 cells/uL (ref 200–950)
Basophils Absolute: 28 cells/uL (ref 0–200)
Basophils Relative: 0.6 %
Eosinophils Absolute: 141 cells/uL (ref 15–500)
Eosinophils Relative: 3 %
HCT: 40.1 % (ref 38.5–50.0)
Hemoglobin: 13.4 g/dL (ref 13.2–17.1)
Lymphs Abs: 1429 cells/uL (ref 850–3900)
MCH: 29.3 pg (ref 27.0–33.0)
MCHC: 33.4 g/dL (ref 32.0–36.0)
MCV: 87.6 fL (ref 80.0–100.0)
MPV: 10.6 fL (ref 7.5–12.5)
Monocytes Relative: 9.6 %
Neutro Abs: 2651 cells/uL (ref 1500–7800)
Neutrophils Relative %: 56.4 %
Platelets: 260 10*3/uL (ref 140–400)
RBC: 4.58 10*6/uL (ref 4.20–5.80)
RDW: 12.6 % (ref 11.0–15.0)
Total Lymphocyte: 30.4 %
WBC: 4.7 10*3/uL (ref 3.8–10.8)

## 2022-06-29 LAB — MAGNESIUM: Magnesium: 2.2 mg/dL (ref 1.5–2.5)

## 2022-06-29 LAB — COMPLETE METABOLIC PANEL WITH GFR
AG Ratio: 2 (calc) (ref 1.0–2.5)
ALT: 18 U/L (ref 9–46)
AST: 20 U/L (ref 10–35)
Albumin: 4.5 g/dL (ref 3.6–5.1)
Alkaline phosphatase (APISO): 54 U/L (ref 35–144)
BUN: 17 mg/dL (ref 7–25)
CO2: 31 mmol/L (ref 20–32)
Calcium: 9.9 mg/dL (ref 8.6–10.3)
Chloride: 106 mmol/L (ref 98–110)
Creat: 0.92 mg/dL (ref 0.70–1.28)
Globulin: 2.3 g/dL (calc) (ref 1.9–3.7)
Glucose, Bld: 82 mg/dL (ref 65–99)
Potassium: 4.3 mmol/L (ref 3.5–5.3)
Sodium: 141 mmol/L (ref 135–146)
Total Bilirubin: 0.3 mg/dL (ref 0.2–1.2)
Total Protein: 6.8 g/dL (ref 6.1–8.1)
eGFR: 88 mL/min/{1.73_m2} (ref >=60)

## 2022-06-29 LAB — HEMOGLOBIN A1C
Hgb A1c MFr Bld: 5.8 % of total Hgb — ABNORMAL HIGH (ref <5.7)
Mean Plasma Glucose: 120 mg/dL
eAG (mmol/L): 6.6 mmol/L

## 2022-06-29 LAB — INSULIN, RANDOM: Insulin: 9 u[IU]/mL

## 2022-06-29 LAB — LIPID PANEL
Cholesterol: 169 mg/dL (ref <200)
HDL: 40 mg/dL (ref >=40)
LDL Cholesterol (Calc): 82 mg/dL (calc)
Non-HDL Cholesterol (Calc): 129 mg/dL (calc) (ref <130)
Total CHOL/HDL Ratio: 4.2 (calc) (ref <5.0)
Triglycerides: 388 mg/dL — ABNORMAL HIGH (ref <150)

## 2022-06-29 LAB — MICROALBUMIN / CREATININE URINE RATIO
Creatinine, Urine: 148 mg/dL (ref 20–320)
Microalb Creat Ratio: 1 mcg/mg creat (ref <30)
Microalb, Ur: 0.2 mg/dL

## 2022-06-29 LAB — METHYLMALONIC ACID, SERUM: Methylmalonic Acid, Quant: 146 nmol/L (ref 87–318)

## 2022-06-29 LAB — TSH: TSH: 2.9 mIU/L (ref 0.40–4.50)

## 2022-06-29 LAB — PSA: PSA: 0.24 ng/mL (ref <=4.00)

## 2022-06-29 LAB — VITAMIN B12: Vitamin B-12: 388 pg/mL (ref 200–1100)

## 2022-06-29 LAB — VITAMIN D 25 HYDROXY (VIT D DEFICIENCY, FRACTURES): Vit D, 25-Hydroxy: 92 ng/mL (ref 30–100)

## 2022-07-06 DIAGNOSIS — Z1212 Encounter for screening for malignant neoplasm of rectum: Secondary | ICD-10-CM | POA: Diagnosis not present

## 2022-07-06 DIAGNOSIS — Z1211 Encounter for screening for malignant neoplasm of colon: Secondary | ICD-10-CM | POA: Diagnosis not present

## 2022-07-12 LAB — COLOGUARD: COLOGUARD: NEGATIVE

## 2022-07-12 NOTE — Progress Notes (Signed)
-   Cologard =- Negative - Great !    - Repeat in 3 years

## 2022-07-27 ENCOUNTER — Other Ambulatory Visit: Payer: Self-pay | Admitting: Nurse Practitioner

## 2022-07-27 ENCOUNTER — Other Ambulatory Visit: Payer: Self-pay

## 2022-07-27 DIAGNOSIS — E782 Mixed hyperlipidemia: Secondary | ICD-10-CM

## 2022-07-27 DIAGNOSIS — F332 Major depressive disorder, recurrent severe without psychotic features: Secondary | ICD-10-CM

## 2022-07-27 MED ORDER — ROSUVASTATIN CALCIUM 20 MG PO TABS: ORAL_TABLET | ORAL | 3 refills | Status: DC

## 2022-09-28 NOTE — Progress Notes (Signed)
MEDICARE ANNUAL WELLNESS VISIT AND FOLLOW UP Assessment:   Diagnoses and all orders for this visit:  Encounter for Medicare annual wellness exam Due annually  Cologuard negative 07/06/22  Labile hypertension Currently at goal without medications Monitor blood pressure at home; call if consistently over 130/80 Continue DASH diet.   Reminder to go to the ER if any CP, SOB, nausea, dizziness, severe HA, changes vision/speech, left arm numbness and tingling and jaw pain. -CBC, CMP  Mixed hyperlipidemia Continue Rosuvastatin 20 mg Continue low cholesterol diet and exercise.  Check lipid panel  Abnormal glucose (prediabetes) Discussed disease and risks Discussed diet/exercise, weight management  Monitor A1C q35m, defer today  Allergic state, sequela Refilled epi pen for bee stings; reminded to follow up at ER   BMI 25, adult Continue to recommend diet heavy in fruits and veggies and low in animal meats, cheeses, and dairy products, appropriate calorie intake Discuss exercise recommendations routinely Continue to monitor weight at each visit - set goal to lose 10 lb this summer  Medication management Check CBC, CMP/GFR, Magnesium  B12 deficiency In range at last check 3 months ago without supplementation  Arthralgia right shoulder Meloxicam daily for the next 2 weeks and if symptoms worsen notify the office  Numbness/tingling bilateral lower extremities Mild; patient declines meds or further workup  Major depression in remission (HCC) In remission on medication  Lifestyle discussed: diet/exerise, sleep hygiene, stress management, hydration  Vitamin D deficiency At goal at recent check; continue to recommend supplementation for goal of 70-100 Defer vitamin D level  Medication management -     CBC with Differential/Platelet -     COMPLETE METABOLIC PANEL WITH GFR -     Lipid panel -     Magnesium  Bee sting allergy Needs new epi pen- prescription sent and is aware  to go to ER if he uses -     EPINEPHrine (EPIPEN) 0.3 mg/0.3 mL IJ SOAJ injection; Inject 0.3 mg into the muscle once for 1 dose.  Acute pain of right shoulder Try Meloxicam for 5 days straight and see if notices any improvement Previously had 1 tendon repaired on right shoulder and is hurthing in same area Refer to Ortho for further evaluation, previously had surgery in [place] -     Ambulatory referral to Orthopedic Surgery  Diplopia Has been evaluated by eye doctor who suggests MRI as new glasses have made no change and it persists. -     MR Brain W Wo Contrast; Future       Over 30 minutes of exam, counseling, chart review, and critical decision making was performed  Future Appointments  Date Time Provider Department Center  01/01/2023 10:30 AM [name], MD [facility] None  04/04/2023  9:30 AM Raynelle Dick, NP [facility] None  07/05/2023 10:00 AM [name], MD [facility] None  10/01/2023  9:30 AM Raynelle Dick, NP [facility] None     Plan:   During the course of the visit the patient was educated and counseled about appropriate screening and preventive services including:   Pneumococcal vaccine  Influenza vaccine Prevnar 13 Td vaccine Screening electrocardiogram Colorectal cancer screening Diabetes screening Glaucoma screening Nutrition counseling    Subjective:  Francisco Dixon is a 72 y.o. male who presents for Medicare Annual Wellness Visit and 3 month follow up for HTN, hyperlipidemia, prediabetes, and vitamin D Def.   He has a diagnosis of depression and has been in remission on effexor 75 mg daily.   2015  had right rotator cuff surgery.  He has noticed that after an exercise in class he had noticed pain in his right shoulder.  He notices pain in right after sleeping, he is a side sleeper, occasionally wakes him up- sharp pain.   He has been having double vision for the past 3 months. Previously 1 year ago he was given antibiotic and it  resolved but has come back 3 months ago and double vision has not gone away since.  Eye exam showed no causes and has had new glasses with no change in double vision.   He has persistent numbness/tingling of bilateral feet, not significantly limiting him. Questionably has been attributed to DDD seen on xray but he declines interventions or work up, managing with exercise and stretching, occasional NSAID/meloxicam. No recent falls  BMI is Body mass index is 24.71 kg/m., he has been working on diet and exercise (30 min exercise tape), very active building deck, installing kitchen cabinets, landscaping, etc, minimal sitting during the day.  Wt Readings from Last 3 Encounters:  09/29/22 157 lb 12.8 oz (71.6 kg)  06/26/22 161 lb 12.8 oz (73.4 kg)  03/16/22 158 lb 9.6 oz (71.9 kg)   His blood pressure controlled without medication, today their BP is BP: 124/80  BP Readings from Last 3 Encounters:  09/29/22 124/80  06/26/22 110/70  03/16/22 118/72  He does workout. He denies chest pain, shortness of breath, dizziness.    He is on cholesterol medication (crestor 20 mg every day)and denies myalgias. His cholesterol is at goal. The cholesterol last visit was:   Lab Results  Component Value Date   CHOL 169 06/26/2022   HDL 40 06/26/2022   LDLCALC 82 06/26/2022   TRIG 388 (H) 06/26/2022   CHOLHDL 4.2 06/26/2022   He has been working on diet and exercise for prediabetes, and denies foot ulcerations, increased appetite, nausea, paresthesia of the feet, polydipsia, polyuria, visual disturbances, vomiting and weight loss. Last A1C in the office was:  Lab Results  Component Value Date   HGBA1C 5.8 (H) 06/26/2022   He is trying to drink more water but mostly drinks unsweetened ice tea. Last GFR Lab Results  Component Value Date   EGFR 88 06/26/2022     Patient is on Vitamin D supplement and at goal at recent check:   Lab Results  Component Value Date   VD25OH 92 06/26/2022     He was on  monthly B12 injections due to history of deficiency,he stopped B12 since it was elevated at last check. Currently in normal range Lab Results  Component Value Date   VITAMINB12 388 06/26/2022     Medication Review: Current Outpatient Medications on File Prior to Visit  Medication Sig Dispense Refill   albuterol (VENTOLIN HFA) 108 (90 Base) MCG/ACT inhaler SMARTSIG:By Mouth     amphetamine-dextroamphetamine (ADDERALL) 20 MG tablet Take  1/2 to 1 tablet  2 x / day as needed for Focus & Concentration 60 tablet 0   aspirin EC 81 MG tablet Take  1 tablet  Daily     Cholecalciferol (VITAMIN D3) 125 MCG (5000 UT) CAPS Take 2 capsules Daily     Flaxseed, Linseed, (FLAXSEED OIL) 1000 MG CAPS Take 2 capsules by mouth daily.     gabapentin (NEURONTIN) 100 MG capsule Take  1 to 3 capsules  3 x /day for Neuropathy Sensations 270 capsule 0   meclizine (ANTIVERT) 25 MG tablet Take 1/2 to 1 tablet 2 to 3 x /day  as needed for Dizziness / Vertigo 90 tablet 11   meloxicam (MOBIC) 15 MG tablet Take  1/2 to 1 tablet  Daily with food for Pain & Inflammation - try limit to 5 days /week to avoid kidney damage 90 tablet 3   Omega-3 Fatty Acids (FISH OIL) 1000 MG CAPS Takes 1 capsule daily  0   rosuvastatin (CRESTOR) 20 MG tablet Take 1 tablet  every day  for Cholesterol 90 tablet 3   venlafaxine XR (EFFEXOR-XR) 75 MG 24 hr capsule TAKE ONE CAPSULE BY MOUTH DAILY FOR mood 90 capsule 3   No current facility-administered medications on file prior to visit.    Allergies: Allergies  Allergen Reactions   Other     Bee stings    Current Problems (verified) has Labile hypertension; Hyperlipidemia, mixed; Vitamin D deficiency; Medication management; Abnormal glucose; Allergy; Major depression in full remission (HCC); Numbness and tingling of both feet; B12 deficiency; and COVID-19 (12/16/2020) on their problem list.  Screening Tests Immunization History  Administered Date(s) Administered   Influenza, High Dose  Seasonal PF 03/21/2021   PFIZER(Purple Top)SARS-COV-2 Vaccination 05/24/2019, 06/19/2019, 02/16/2020   Pneumococcal Conjugate-13 04/29/2018   Pneumococcal Polysaccharide-23 08/18/2019   Td 01/03/2017    Health Maintenance  Topic Date Due   COVID-19 Vaccine (4 - 2023-24 season) 10/15/2022 (Originally 12/16/2021)   Zoster Vaccines- Shingrix (1 of 2) 12/30/2022 (Originally 06/02/1969)   Medicare Annual Wellness (AWV)  09/29/2023   Colonoscopy  12/02/2025   DTaP/Tdap/Td (2 - Tdap) 01/04/2027   Pneumonia Vaccine 45+ Years old  Completed   Hepatitis C Screening  Completed   HPV VACCINES  Aged Out   INFLUENZA VACCINE  Discontinued     Names of Other Physician/Practitioners you currently use: 1. [facility] here for primary care 2. Dr. Freida Busman, eye doctor, last visit 08/2022 3.  Dr. Coralee North, new dentist, last visit 08/2022  Patient Care Team: [name], MD as PCP - General (Internal Medicine)  Surgical: He  has a past surgical history that includes Colonoscopy (N/A, 2002,2007,2012) and Rotator cuff repair (Right, 2015). Family His family history is not on file. Social history  He reports that he has never smoked. He has never used smokeless tobacco. He reports current alcohol use. He reports that he does not use drugs.  MEDICARE WELLNESS OBJECTIVES: Physical activity: Current Exercise Habits: Home exercise routine;Structured exercise class, Type of exercise: walking (walking 2 days a week and exercise class 3 days a week), Time (Minutes): 60, Frequency (Times/Week): 5, Weekly Exercise (Minutes/Week): 300, Intensity: Mild, Exercise limited by: None identified Cardiac risk factors: Cardiac Risk Factors include: advanced age (>33men, >66 women);hypertension;male gender Depression/mood screen:      09/29/2022   10:56 AM  Depression screen PHQ 2/9  Decreased Interest 0  Down, Depressed, Hopeless 0  PHQ - 2 Score 0    ADLs:     09/29/2022    10:56 AM 06/26/2022    8:56 PM  In your present state of health, do you have any difficulty performing the following activities:  Hearing? 0 0  Vision? 0 0  Difficulty concentrating or making decisions? 0 0  Walking or climbing stairs? 0 0  Dressing or bathing? 0 0  Doing errands, shopping? 0 0     Cognitive Testing  Alert? Yes  Normal Appearance?Yes  Oriented to person? Yes  Place? Yes   Time? Yes  Recall of three objects?  Yes  Can perform simple calculations? Yes  Displays appropriate  judgment?Yes  Can read the correct time from a watch face?Yes  EOL planning: Does Patient Have a Medical Advance Directive?: Yes Type of Advance Directive: Healthcare Power of Attorney, Living will Does patient want to make changes to medical advance directive?: No - Patient declined Copy of Healthcare Power of Attorney in Chart?: No - copy requested   Objective:   Today's Vitals   09/29/22 1050  BP: 124/80  Pulse: 73  Resp: 16  Temp: 97.9 F (36.6 C)  SpO2: 98%  Weight: 157 lb 12.8 oz (71.6 kg)  Height: 5\' 7"  (1.702 m)     Body mass index is 24.71 kg/m.  General appearance: alert, no distress, WD/WN, male HEENT: normocephalic, sclerae anicteric, TMs pearly, nares patent, no discharge or erythema, pharynx normal. HOH with bilateral hearing aids.  Oral cavity: MMM, no lesions Neck: supple, no lymphadenopathy, no thyromegaly, no masses Heart: RRR, normal S1, S2, no murmurs Lungs: CTA bilaterally, no wheezes, rhonchi, or rales Abdomen: +bs, soft, non tender, non distended, no masses, no hepatomegaly, no splenomegaly Musculoskeletal: nontender, no swelling, no obvious deformity. Tenderness with pressure to anterior of shoulder, full ROM Extremities: no edema, no cyanosis, no clubbing Pulses: 2+ symmetric, upper and lower extremities, normal cap refill Neurological: alert, oriented x 3, CN2-12 intact, strength normal upper extremities and lower extremities, sensation diminished bil  feet/toes, DTRs 2+ throughout, no cerebellar signs, gait normal Psychiatric: normal affect, behavior normal, pleasant   Medicare Attestation I have personally reviewed: The patient's medical and social history Their use of alcohol, tobacco or illicit drugs Their current medications and supplements The patient's functional ability including ADLs,fall risks, home safety risks, cognitive, and hearing and visual impairment Diet and physical activities Evidence for depression or mood disorders  The patient's weight, height, BMI, and visual acuity have been recorded in the chart.  I have made referrals, counseling, and provided education to the patient based on review of the above and I have provided the patient with a written personalized care plan for preventive services.     Raynelle Dick, NP   09/29/2022

## 2022-09-29 ENCOUNTER — Ambulatory Visit (INDEPENDENT_AMBULATORY_CARE_PROVIDER_SITE_OTHER): Payer: Medicare Other | Admitting: Nurse Practitioner

## 2022-09-29 ENCOUNTER — Encounter: Payer: Self-pay | Admitting: Nurse Practitioner

## 2022-09-29 VITALS — BP 124/80 | HR 73 | Temp 97.9°F | Resp 16 | Ht 67.0 in | Wt 157.8 lb

## 2022-09-29 DIAGNOSIS — Z79899 Other long term (current) drug therapy: Secondary | ICD-10-CM

## 2022-09-29 DIAGNOSIS — R6889 Other general symptoms and signs: Secondary | ICD-10-CM

## 2022-09-29 DIAGNOSIS — F3342 Major depressive disorder, recurrent, in full remission: Secondary | ICD-10-CM | POA: Diagnosis not present

## 2022-09-29 DIAGNOSIS — Z0001 Encounter for general adult medical examination with abnormal findings: Secondary | ICD-10-CM | POA: Diagnosis not present

## 2022-09-29 DIAGNOSIS — E538 Deficiency of other specified B group vitamins: Secondary | ICD-10-CM | POA: Diagnosis not present

## 2022-09-29 DIAGNOSIS — Z6825 Body mass index (BMI) 25.0-25.9, adult: Secondary | ICD-10-CM | POA: Diagnosis not present

## 2022-09-29 DIAGNOSIS — R200 Anesthesia of skin: Secondary | ICD-10-CM

## 2022-09-29 DIAGNOSIS — Z91030 Bee allergy status: Secondary | ICD-10-CM | POA: Diagnosis not present

## 2022-09-29 DIAGNOSIS — M2550 Pain in unspecified joint: Secondary | ICD-10-CM

## 2022-09-29 DIAGNOSIS — M25511 Pain in right shoulder: Secondary | ICD-10-CM

## 2022-09-29 DIAGNOSIS — R202 Paresthesia of skin: Secondary | ICD-10-CM

## 2022-09-29 DIAGNOSIS — R7309 Other abnormal glucose: Secondary | ICD-10-CM | POA: Diagnosis not present

## 2022-09-29 DIAGNOSIS — Z0000 Encounter for general adult medical examination without abnormal findings: Secondary | ICD-10-CM

## 2022-09-29 DIAGNOSIS — H532 Diplopia: Secondary | ICD-10-CM

## 2022-09-29 DIAGNOSIS — R0989 Other specified symptoms and signs involving the circulatory and respiratory systems: Secondary | ICD-10-CM

## 2022-09-29 DIAGNOSIS — E782 Mixed hyperlipidemia: Secondary | ICD-10-CM

## 2022-09-29 DIAGNOSIS — E559 Vitamin D deficiency, unspecified: Secondary | ICD-10-CM

## 2022-09-29 DIAGNOSIS — T7840XS Allergy, unspecified, sequela: Secondary | ICD-10-CM

## 2022-09-29 MED ORDER — EPINEPHRINE 0.3 MG/0.3ML IJ SOAJ: 0.3000 mg | Freq: Once | INTRAMUSCULAR | 0 refills | Status: AC

## 2022-09-29 NOTE — Patient Instructions (Signed)
Shoulder Pain Many things can cause shoulder pain, including: An injury. Moving the shoulder in the same way again and again (overuse). Joint pain (arthritis). Pain can come from: Swelling and irritation (inflammation) of any part of the shoulder. An injury to: The shoulder joint. Tissues that connect muscle to bone (tendons). Tissues that connect bones to each other (ligaments). Bones. Follow these instructions at home: Watch for changes in your symptoms. Let your doctor know about them. Follow these instructions to help with your pain. If you have a sling that can be taken off: Wear the sling as told by your doctor. Take it off only as told by your doctor. Check the skin around the sling every day. Tell your doctor if you see problems. Loosen the sling if your fingers: Tingle. Become numb. Become cold. Keep the sling clean. If the sling is not waterproof: Do not let it get wet. Take the sling off when you shower or bathe. Managing pain, stiffness, and swelling  If told, put ice on the painful area. Put ice in a plastic bag. Place a towel between your skin and the bag. Leave the ice on for 20 minutes, 2-3 times a day. Stop putting ice on if it does not help with the pain. If your skin turns bright red, take off the ice right away to prevent skin damage. The risk of damage is higher if you cannot feel pain, heat, or cold. Squeeze a soft ball or a foam pad as much as possible. This prevents swelling in the shoulder. It also helps to strengthen the arm. General instructions Take over-the-counter and prescription medicines only as told by your doctor. Keep all follow-up visits. This will help you avoid any type of permanent shoulder problems. Contact a doctor if: Your pain gets worse. Medicine does not help your pain. You have new pain in your arm, hand, or fingers. You loosen your sling and your arm, hand, or fingers: Tingle. Are numb. Are swollen. Get help right away  if: Your arm, hand, or fingers turn white or blue. This information is not intended to replace advice given to you by your health care provider. Make sure you discuss any questions you have with your health care provider. Document Revised: 11/04/2021 Document Reviewed: 11/04/2021 Elsevier Patient Education  2024 [employer].

## 2022-09-30 LAB — COMPLETE METABOLIC PANEL WITH GFR
AG Ratio: 1.7 (calc) (ref 1.0–2.5)
ALT: 17 U/L (ref 9–46)
AST: 22 U/L (ref 10–35)
Albumin: 4.4 g/dL (ref 3.6–5.1)
Alkaline phosphatase (APISO): 53 U/L (ref 35–144)
BUN: 19 mg/dL (ref 7–25)
CO2: 29 mmol/L (ref 20–32)
Calcium: 9.8 mg/dL (ref 8.6–10.3)
Chloride: 106 mmol/L (ref 98–110)
Creat: 1.01 mg/dL (ref 0.70–1.28)
Globulin: 2.6 g/dL (calc) (ref 1.9–3.7)
Glucose, Bld: 92 mg/dL (ref 65–99)
Potassium: 4.4 mmol/L (ref 3.5–5.3)
Sodium: 143 mmol/L (ref 135–146)
Total Bilirubin: 0.7 mg/dL (ref 0.2–1.2)
Total Protein: 7 g/dL (ref 6.1–8.1)
eGFR: 79 mL/min/{1.73_m2} (ref >=60)

## 2022-09-30 LAB — LIPID PANEL
Cholesterol: 145 mg/dL (ref <200)
HDL: 46 mg/dL (ref >=40)
LDL Cholesterol (Calc): 78 mg/dL (calc)
Non-HDL Cholesterol (Calc): 99 mg/dL (calc) (ref <130)
Total CHOL/HDL Ratio: 3.2 (calc) (ref <5.0)
Triglycerides: 120 mg/dL (ref <150)

## 2022-09-30 LAB — CBC WITH DIFFERENTIAL/PLATELET
Absolute Monocytes: 425 cells/uL (ref 200–950)
Basophils Absolute: 50 cells/uL (ref 0–200)
Basophils Relative: 1 %
Eosinophils Absolute: 170 cells/uL (ref 15–500)
Eosinophils Relative: 3.4 %
HCT: 41.2 % (ref 38.5–50.0)
Hemoglobin: 13.8 g/dL (ref 13.2–17.1)
Lymphs Abs: 1555 cells/uL (ref 850–3900)
MCH: 29.6 pg (ref 27.0–33.0)
MCHC: 33.5 g/dL (ref 32.0–36.0)
MCV: 88.4 fL (ref 80.0–100.0)
MPV: 10.5 fL (ref 7.5–12.5)
Monocytes Relative: 8.5 %
Neutro Abs: 2800 cells/uL (ref 1500–7800)
Neutrophils Relative %: 56 %
Platelets: 235 10*3/uL (ref 140–400)
RBC: 4.66 10*6/uL (ref 4.20–5.80)
RDW: 12.5 % (ref 11.0–15.0)
Total Lymphocyte: 31.1 %
WBC: 5 10*3/uL (ref 3.8–10.8)

## 2022-09-30 LAB — MAGNESIUM: Magnesium: 2.1 mg/dL (ref 1.5–2.5)

## 2022-10-23 ENCOUNTER — Ambulatory Visit (HOSPITAL_COMMUNITY)
Admission: RE | Admit: 2022-10-23 | Discharge: 2022-10-23 | Disposition: A | Discharge status: Home / Self Care | Payer: Medicare Other | Source: Ambulatory Visit | Attending: Nurse Practitioner | Admitting: Nurse Practitioner

## 2022-10-23 DIAGNOSIS — H532 Diplopia: Secondary | ICD-10-CM | POA: Diagnosis not present

## 2022-10-23 MED ORDER — GADOBUTROL 1 MMOL/ML IV SOLN
7.0000 mL | Freq: Once | INTRAVENOUS | Status: AC | PRN
  Administered 2022-10-23: 7 mL via INTRAVENOUS

## 2022-11-07 ENCOUNTER — Encounter: Payer: Self-pay | Admitting: Nurse Practitioner

## 2022-11-08 ENCOUNTER — Ambulatory Visit: Payer: Medicare Other | Admitting: Orthopedic Surgery

## 2022-11-08 ENCOUNTER — Other Ambulatory Visit (INDEPENDENT_AMBULATORY_CARE_PROVIDER_SITE_OTHER): Payer: Medicare Other

## 2022-11-08 DIAGNOSIS — M25511 Pain in right shoulder: Secondary | ICD-10-CM

## 2022-11-10 ENCOUNTER — Encounter: Payer: Self-pay | Admitting: Orthopedic Surgery

## 2022-11-10 NOTE — Progress Notes (Signed)
Office Visit Note   Patient: Francisco Dixon           Date of Birth: 25-May-1950           MRN: [id number] Visit Date: 11/08/2022 Requested by: Raynelle Dick, NP [address] [place],  [place] [address] PCP: [name], MD  Subjective: Chief Complaint  Patient presents with   Right Shoulder - Pain    HPI: Francisco Dixon is a 72 y.o. male who presents to the office reporting right shoulder pain of 4 months duration.  He is very active and likes to workout.  Was doing some reverse flies about 3 months ago and the shoulder started hurting.  Hard for him to sleep.  Does not report much weakness but does report pain.  No new mechanical symptoms.  He does exercise class about 3 days a week along with golf and also mows the lawn and does woodworking.  He did undergo a right shoulder rotator cuff tear repair about 8 years ago in [place].  He is right-hand dominant.  Pain is a little bit worse with sleeping.  Does occasionally wake him from sleep at night.  Most of the pain is anterior.  Takes occasional aspirin for his symptoms..                ROS: All systems reviewed are negative as they relate to the chief complaint within the history of present illness.  Patient denies fevers or chills.  Assessment & Plan: Visit Diagnoses:  1. Right shoulder pain, unspecified chronicity     Plan: Impression is history of right shoulder rotator cuff surgery with pretty good strength.  He states that they did do a biceps tendon release and it appears that the biceps tendon has settled in the bicipital groove.  Not too much of a Popeye deformity is present.  Rotator cuff strength is pretty reasonable.  No frozen shoulder.  I think this could be bursitis.  No obvious problems seen today.  He is going to wait this out a little bit longer.  Ultrasound examination did not really show a massive rotator cuff tear but he may have a small 1 present which is not easily visualized with the ultrasound.   He is going to give this about 6-8 more weeks and if he remains symptomatic he will call and we will get him set up for MRI scanning.  Follow-up after that scan if it is needed.  Follow-Up Instructions: No follow-ups on file.   Orders:  Orders Placed This Encounter  Procedures   XR Shoulder Right   No orders of the defined types were placed in this encounter.     Procedures: No procedures performed   Clinical Data: No additional findings.  Objective: Vital Signs: There were no vitals taken for this visit.  Physical Exam:  Constitutional: Patient appears well-developed HEENT:  Head: Normocephalic Eyes:EOM are normal Neck: Normal range of motion Cardiovascular: Normal rate Pulmonary/chest: Effort normal Neurologic: Patient is alert Skin: Skin is warm Psychiatric: Patient has normal mood and affect  Ortho Exam: Ortho exam demonstrates good cervical spine range of motion.  5 out of 5 grip EPL FPL interosseous resection extension bicep triceps abductor strength.  Radial pulses intact.  Cervical spine range of motion intact with flexion chin to chest extension is about 40 degrees rotation 60 degrees bilaterally.  Right shoulder is examined.  Has good passive range of motion of 60/100/170.  Good rotator cuff strength  infraspinatus supraspinatus and subscap muscle testing.  No obvious Popeye deformity or biceps deformity on the right-hand side.  No asymmetric AC joint tenderness.  No other masses lymphadenopathy or skin changes noted in that shoulder girdle region.  No coarse grinding or crepitus with internal/external rotation of the shoulder at 90 degrees of abduction.  Ultrasound examination demonstrates biceps tendon in the bicipital groove along with intact subscap.  Visualized portion of the supraspinatus also appeared intact but there was a little bit of hypoechoic signal in that  Specialty Comments:  No specialty comments available.  Imaging: No results found.   PMFS  History: Patient Active Problem List   Diagnosis Date Noted   COVID-19 (12/16/2020) 12/17/2020   B12 deficiency 11/12/2018   Numbness and tingling of both feet 11/27/2017   Major depression in full remission (HCC) 07/09/2017   Medication management 07/07/2017   Abnormal glucose 07/07/2017   Allergy 07/07/2017   Labile hypertension 02/26/2016   Hyperlipidemia, mixed 02/26/2016   Vitamin D deficiency 02/26/2016   Past Medical History:  Diagnosis Date   GERD (gastroesophageal reflux disease)    Hypercholesterolemia    Hypertension    labile    Plantar fasciitis, right 12/07/2016   Vitamin D deficiency     History reviewed. No pertinent family history.  Past Surgical History:  Procedure Laterality Date   COLONOSCOPY N/A 2002,2007,2012   ROTATOR CUFF REPAIR Right 2015   Social History   Occupational History   Not on file  Tobacco Use   Smoking status: Never   Smokeless tobacco: Never  Substance and Sexual Activity   Alcohol use: Yes    Comment: drinks 2-3 beers a week   Drug use: No   Sexual activity: Not on file

## 2022-12-12 DIAGNOSIS — R202 Paresthesia of skin: Secondary | ICD-10-CM | POA: Diagnosis not present

## 2022-12-12 DIAGNOSIS — R112 Nausea with vomiting, unspecified: Secondary | ICD-10-CM | POA: Diagnosis not present

## 2022-12-12 DIAGNOSIS — R7989 Other specified abnormal findings of blood chemistry: Secondary | ICD-10-CM | POA: Diagnosis not present

## 2022-12-12 DIAGNOSIS — R531 Weakness: Secondary | ICD-10-CM | POA: Diagnosis not present

## 2022-12-12 DIAGNOSIS — T43625A Adverse effect of amphetamines, initial encounter: Secondary | ICD-10-CM | POA: Diagnosis not present

## 2022-12-12 DIAGNOSIS — R42 Dizziness and giddiness: Secondary | ICD-10-CM | POA: Diagnosis not present

## 2022-12-12 DIAGNOSIS — R0602 Shortness of breath: Secondary | ICD-10-CM | POA: Diagnosis not present

## 2022-12-31 ENCOUNTER — Encounter: Payer: Self-pay | Admitting: Internal Medicine

## 2022-12-31 NOTE — Progress Notes (Unsigned)
Future Appointments  Date Time Provider Department  01/01/2023                         6 mo 10:30 AM [name], MD [facility]  04/04/2023                       9 mo   9:30 AM Raynelle Dick, NP [facility]  07/05/2023                         cpe 10:00 AM [name], MD [facility]  10/01/2023                         wellness  9:30 AM Raynelle Dick, NP [facility]    History of Present Illness:       This very nice 72 y.o. [name] presents for 6  month follow up with labile HTN, HLD, Pre-Diabetes and Vitamin D Deficiency.  Patient was initiated on meds for ADD last year.         Patient is monitored for labile HTN & BP has been controlled at home. Today's BP is at goal - 136/78 . Patient has had no complaints of any cardiac type chest pain, palpitations, dyspnea Pollyann Kennedy /PND, dizziness, claudication   or dependent edema.        Hyperlipidemia is controlled with diet & Rosuvastatin. Patient denies myalgias or other med SE's. Last Lipids were at goal:  Lab Results  Component Value Date   CHOL 145 09/29/2022   HDL 46 09/29/2022   LDLCALC 78 09/29/2022   TRIG 120 09/29/2022   CHOLHDL 3.2 09/29/2022     Also, the patient has history of abnormal glucose & is monitored for glucose intolerance.  Patient has had no symptoms of reactive hypoglycemia, diabetic polys, paresthesias or visual blurring.  Last A1c was near goal:  Lab Results  Component Value Date   HGBA1C 5.8 (H) 06/26/2022                                                       Further, the patient also has history of Vitamin D Deficiency ("29" /2017) and supplements vitamin D without any suspected side-effects. Last vitamin D was at goal:  Lab Results  Component Value Date   VD25OH 92 06/26/2022       Current Outpatient Medications  Medication Instructions   albuterol  HFA  inhaler SMARTSIG:By Mouth   ADDERALL 20 MG tablet Take  1/2 to 1 tablet  2 x / day as needed    aspirin EC 81 MG tablet  Take  1 tablet  Daily   VITAMIN D  5000 u Take 2 capsules Daily   FLAXSEED OIL  1000 MG  2 capsules  Daily   gabapentin 100 MG capsule Take  1 to 3 capsules  3 x /day   meclizine  25 MG tablet Take 1/2 to 1 tablet 2 to 3 x /day as needed   meloxicam 15 MG tablet Take  1/2 to 1 tablet  Daily    Omega-3 FISH OIL 1000 MG  Takes 1 capsule daily   rosuvastatin  20 MG tablet Take 1 tablet  every day  venlafaxine -XR 75 MG  TAKE ONE CAPSULE DAILY      Allergies  Allergen Reactions   Other     Bee stings    PMHx:   Past Medical History:  Diagnosis Date   GERD (gastroesophageal reflux disease)    Hypercholesterolemia    Hypertension    labile    Plantar fasciitis, right 12/07/2016   Vitamin D deficiency      Immunization History  Administered Date(s) Administered   PFIZER SARS-COV-2 Vacc 05/24/2019, 06/19/2019, 02/16/2020   Pneumococcal -13 04/29/2018   Pneumococcal -23 08/18/2019   Td 01/03/2017     Past Surgical History:  Procedure Laterality Date   COLONOSCOPY N/A 2002,2007,2012   ROTATOR CUFF REPAIR Right 2015    FHx:    Reviewed / unchanged \  SHx:    Reviewed / unchanged    Systems Review:  Constitutional: Denies fever, chills, wt changes, headaches, insomnia, fatigue, night sweats, change in appetite. Eyes: Denies redness, blurred vision, diplopia, discharge, itchy, watery eyes.  ENT: Denies discharge, congestion, post nasal drip, epistaxis, sore throat, earache, hearing loss, dental pain, tinnitus, vertigo, sinus pain, snoring.  CV: Denies chest pain, palpitations, irregular heartbeat, syncope, dyspnea, diaphoresis, orthopnea, PND, claudication or edema. Respiratory: denies cough, dyspnea, DOE, pleurisy, hoarseness, laryngitis, wheezing.  Gastrointestinal: Denies dysphagia, odynophagia, heartburn, reflux, water brash, abdominal pain or cramps, nausea, vomiting, bloating, diarrhea, constipation, hematemesis, melena, hematochezia  or  hemorrhoids. Genitourinary: Denies dysuria, frequency, urgency, nocturia, hesitancy, discharge, hematuria or flank pain. Musculoskeletal: Denies arthralgias, myalgias, stiffness, jt. swelling, pain, limping or strain/sprain.  Skin: Denies pruritus, rash, hives, warts, acne, eczema or change in skin lesion(s). Neuro: No weakness, tremor, incoordination, spasms, paresthesia or pain. Psychiatric: Denies confusion, memory loss or sensory loss. Endo: Denies change in weight, skin or hair change.  Heme/Lymph: No excessive bleeding, bruising or enlarged lymph nodes.  Physical Exam  BP 136/78   Pulse 74   Temp 97.9 F (36.6 C)   Resp 17   Ht 5\' 7"  (1.702 m)   Wt 161 lb 3.2 oz (73.1 kg)   SpO2 98%   BMI 25.25 kg/m   Appears  well nourished, well groomed  and in no distress.  Eyes: PERRLA, EOMs, conjunctiva no swelling or erythema. Sinuses: No frontal/maxillary tenderness ENT/Mouth: EAC's clear, TM's nl w/o erythema, bulging. Nares clear w/o erythema, swelling, exudates. Oropharynx clear without erythema or exudates. Oral hygiene is good. Tongue normal, non obstructing. Hearing intact.  Neck: Supple. Thyroid not palpable. Car 2+/2+ without bruits, nodes or JVD. Chest: Respirations nl with BS clear & equal w/o rales, rhonchi, wheezing or stridor.  Cor: Heart sounds normal w/ regular rate and rhythm without sig. murmurs, gallops, clicks or rubs. Peripheral pulses normal and equal  without edema.  Abdomen: Soft & bowel sounds normal. Non-tender w/o guarding, rebound, hernias, masses or organomegaly.  Lymphatics: Unremarkable.  Musculoskeletal: Full ROM all peripheral extremities, joint stability, 5/5 strength and normal gait.  Skin: Warm, dry without exposed rashes, lesions or ecchymosis apparent.  Neuro: Cranial nerves intact, reflexes equal bilaterally. Sensory-motor testing grossly intact. Tendon reflexes grossly intact.  Pysch: Alert & oriented x 3.  Insight and judgement nl & appropriate.  No ideations.    Assessment and Plan:   1. Labile hypertension  - Continue medication, monitor blood pressure at home.  - Continue DASH diet.  Reminder to go to the ER if any CP,  SOB, nausea, dizziness, severe HA, changes vision/speech.   - CBC with Differential/Platelet - COMPLETE METABOLIC  PANEL WITH GFR - Magnesium - TSH   2. Hyperlipidemia, mixed  - Continue diet/meds, exercise,& lifestyle modifications.  - Continue monitor periodic cholesterol/liver & renal functions      - Lipid panel - TSH   3. Abnormal glucose  - Continue diet, exercise  - Lifestyle modifications.  - Monitor appropriate labs    - Hemoglobin A1c - Insulin, random   4. Vitamin D deficiency  - Continue supplementation.     - VITAMIN D 25 Hydroxy    5. Attention deficit hyperactivity disorder (ADHD)   6. Medication management  - CBC with Differential/Platelet - COMPLETE METABOLIC PANEL WITH GFR - Magnesium - Lipid panel - TSH - Hemoglobin A1c - Insulin, random - VITAMIN D 25 Hydroxy         Discussed  regular exercise, BP monitoring, weight control to achieve/maintain BMI less than 25 and discussed med and SE's. Recommended labs to assess and monitor clinical status with further disposition pending results of labs.  I discussed the assessment and treatment plan with the patient. The patient was provided an opportunity to ask questions and all were answered. The patient agreed with the plan and demonstrated an understanding of the instructions.  I provided over 30 minutes of exam, counseling, chart review and  complex critical decision making.       The patient was advised to call back or seek an in-person evaluation if the symptoms worsen or if the condition fails to improve as anticipated.   Marinus Maw, MD

## 2022-12-31 NOTE — Patient Instructions (Signed)

## 2023-01-01 ENCOUNTER — Ambulatory Visit (INDEPENDENT_AMBULATORY_CARE_PROVIDER_SITE_OTHER): Payer: Medicare Other | Admitting: Internal Medicine

## 2023-01-01 ENCOUNTER — Encounter: Payer: Self-pay | Admitting: Internal Medicine

## 2023-01-01 VITALS — BP 136/78 | HR 74 | Temp 97.9°F | Resp 17 | Ht 67.0 in | Wt 161.2 lb

## 2023-01-01 DIAGNOSIS — F900 Attention-deficit hyperactivity disorder, predominantly inattentive type: Secondary | ICD-10-CM

## 2023-01-01 DIAGNOSIS — R0989 Other specified symptoms and signs involving the circulatory and respiratory systems: Secondary | ICD-10-CM | POA: Diagnosis not present

## 2023-01-01 DIAGNOSIS — E782 Mixed hyperlipidemia: Secondary | ICD-10-CM | POA: Diagnosis not present

## 2023-01-01 DIAGNOSIS — E559 Vitamin D deficiency, unspecified: Secondary | ICD-10-CM

## 2023-01-01 DIAGNOSIS — Z79899 Other long term (current) drug therapy: Secondary | ICD-10-CM | POA: Diagnosis not present

## 2023-01-01 DIAGNOSIS — R7309 Other abnormal glucose: Secondary | ICD-10-CM | POA: Diagnosis not present

## 2023-01-02 LAB — CBC WITH DIFFERENTIAL/PLATELET
Absolute Monocytes: 379 {cells}/uL (ref 200–950)
Basophils Absolute: 58 {cells}/uL (ref 0–200)
Basophils Relative: 1.2 %
Eosinophils Absolute: 158 {cells}/uL (ref 15–500)
Eosinophils Relative: 3.3 %
HCT: 43.9 % (ref 38.5–50.0)
Hemoglobin: 14.3 g/dL (ref 13.2–17.1)
Lymphs Abs: 1354 {cells}/uL (ref 850–3900)
MCH: 29.9 pg (ref 27.0–33.0)
MCHC: 32.6 g/dL (ref 32.0–36.0)
MCV: 91.8 fL (ref 80.0–100.0)
MPV: 10.4 fL (ref 7.5–12.5)
Monocytes Relative: 7.9 %
Neutro Abs: 2851 {cells}/uL (ref 1500–7800)
Neutrophils Relative %: 59.4 %
Platelets: 277 10*3/uL (ref 140–400)
RBC: 4.78 10*6/uL (ref 4.20–5.80)
RDW: 12.4 % (ref 11.0–15.0)
Total Lymphocyte: 28.2 %
WBC: 4.8 10*3/uL (ref 3.8–10.8)

## 2023-01-02 LAB — COMPLETE METABOLIC PANEL WITH GFR
AG Ratio: 2.3 (calc) (ref 1.0–2.5)
ALT: 20 U/L (ref 9–46)
AST: 19 U/L (ref 10–35)
Albumin: 4.6 g/dL (ref 3.6–5.1)
Alkaline phosphatase (APISO): 53 U/L (ref 35–144)
BUN: 17 mg/dL (ref 7–25)
CO2: 32 mmol/L (ref 20–32)
Calcium: 10 mg/dL (ref 8.6–10.3)
Chloride: 102 mmol/L (ref 98–110)
Creat: 1.03 mg/dL (ref 0.70–1.28)
Globulin: 2 g/dL (ref 1.9–3.7)
Glucose, Bld: 95 mg/dL (ref 65–99)
Potassium: 4.7 mmol/L (ref 3.5–5.3)
Sodium: 140 mmol/L (ref 135–146)
Total Bilirubin: 0.6 mg/dL (ref 0.2–1.2)
Total Protein: 6.6 g/dL (ref 6.1–8.1)
eGFR: 77 mL/min/{1.73_m2} (ref >=60)

## 2023-01-02 LAB — LIPID PANEL
Cholesterol: 153 mg/dL (ref <200)
HDL: 52 mg/dL (ref >=40)
LDL Cholesterol (Calc): 76 mg/dL
Non-HDL Cholesterol (Calc): 101 mg/dL (ref <130)
Total CHOL/HDL Ratio: 2.9 (calc) (ref <5.0)
Triglycerides: 156 mg/dL — ABNORMAL HIGH (ref <150)

## 2023-01-02 LAB — MAGNESIUM: Magnesium: 2.2 mg/dL (ref 1.5–2.5)

## 2023-01-02 LAB — HEMOGLOBIN A1C
Hgb A1c MFr Bld: 6 %{Hb} — ABNORMAL HIGH (ref <5.7)
Mean Plasma Glucose: 126 mg/dL
eAG (mmol/L): 7 mmol/L

## 2023-01-02 LAB — TSH: TSH: 2.69 m[IU]/L (ref 0.40–4.50)

## 2023-01-02 LAB — VITAMIN D 25 HYDROXY (VIT D DEFICIENCY, FRACTURES): Vit D, 25-Hydroxy: 101 ng/mL — ABNORMAL HIGH (ref 30–100)

## 2023-01-02 LAB — INSULIN, RANDOM: Insulin: 6.4 u[IU]/mL

## 2023-01-02 NOTE — Progress Notes (Signed)
<>*<>*<>*<>*<>*<>*<>*<>*<>*<>*<>*<>*<>*<>*<>*<>*<>*<>*<>*<>*<>*<>*<>*<>*<> <>*<>*<>*<>*<>*<>*<>*<>*<>*<>*<>*<>*<>*<>*<>*<>*<>*<>*<>*<>*<>*<>*<>*<>*<>  -Test results slightly outside the reference range are not unusual. If there is anything important, I will review this with you,  otherwise it is considered normal test values.  If you have further questions,  please do not hesitate to contact me at the office or via My Chart.   <>*<>*<>*<>*<>*<>*<>*<>*<>*<>*<>*<>*<>*<>*<>*<>*<>*<>*<>*<>*<>*<>*<>*<>*<> <>*<>*<>*<>*<>*<>*<>*<>*<>*<>*<>*<>*<>*<>*<>*<>*<>*<>*<>*<>*<>*<>*<>*<>*<>  -  A1c = 6.0% is still elevated in the borderline and                                                     early or pre-diabetes range which has the same   300% increased risk for heart attack, stroke, cancer and                                        alzheimer- type vascular dementia as full blown diabetes.   But the good news is that diet, exercise with                                                   weight loss can cure the early diabetes at this point.   -  It is very important that you work harder with diet by                             avoiding all foods that are white except chicken, fish & calliflower.  - Avoid white rice  (brown & wild rice is OK),   - Avoid white potatoes  (sweet potatoes in moderation is OK),   White bread or wheat bread or anything made out of   white flour like bagels, donuts, rolls, buns, biscuits, cakes,  - pastries, cookies, pizza crust, and pasta (made from white flour & egg whites)   - vegetarian pasta or spinach or wheat pasta is OK.  - Multigrain breads like Arnold's, Pepperidge Farm or                                                    multigrain sandwich thins or high fiber breads like   [place] bread or "Dave's Killer" breads that are                                                                       4 to 5 grams fiber per slice !  are best.    Diet,  exercise and weight loss can reverse and cure diabetes in the early stages.    - Diet, exercise and weight loss is very important in the   control and prevention of complications of diabetes which  affects every system in your body  <>*<>*<>*<>*<>*<>*<>*<>*<>*<>*<>*<>*<>*<>*<>*<>*<>*<>*<>*<>*<>*<>*<>*<>*<> <>*<>*<>*<>*<>*<>*<>*<>*<>*<>*<>*<>*<>*<>*<>*<>*<>*<>*<>*<>*<>*<>*<>*<>*<>  -  Vitamin D = 101 - Excellent - Please keep dosage same  <>*<>*<>*<>*<>*<>*<>*<>*<>*<>*<>*<>*<>*<>*<>*<>*<>*<>*<>*<>*<>*<>*<>*<>*<> <>*<>*<>*<>*<>*<>*<>*<>*<>*<>*<>*<>*<>*<>*<>*<>*<>*<>*<>*<>*<>*<>*<>*<>*<>  -  Chol = 153  &  LDL = 76  - Both . Excellent   - Very low risk for Heart Attack  / Stroke ^>^>^>^>^>^>^>^>^>^>^>^>^>^>^>^>^>^>^>^>^>^>^>^>^>^>^>^>^>^>^>^>^>^>^>^>^ ^>^>^>^>^>^>^>^>^>^>^>^>^>^>^>^>^>^>^>^>^>^>^>^>^>^>^>^>^>^>^>^>^>^>^>^>^  -  All Else - CBC - Kidneys - Electrolytes - Liver - Magnesium & Thyroid    - all  Normal / OK  <>*<>*<>*<>*<>*<>*<>*<>*<>*<>*<>*<>*<>*<>*<>*<>*<>*<>*<>*<>*<>*<>*<>*<>*<> <>*<>*<>*<>*<>*<>*<>*<>*<>*<>*<>*<>*<>*<>*<>*<>*<>*<>*<>*<>*<>*<>*<>*<>*<>

## 2023-03-06 DIAGNOSIS — H2513 Age-related nuclear cataract, bilateral: Secondary | ICD-10-CM | POA: Diagnosis not present

## 2023-04-03 NOTE — Progress Notes (Unsigned)
FOLLOW UP Assessment:    Labile hypertension Currently at goal without medications Monitor blood pressure at home; call if consistently over 130/80 Continue DASH diet.   Reminder to go to the ER if any CP, SOB, nausea, dizziness, severe HA, changes vision/speech, left arm numbness and tingling and jaw pain. -CBC, CMP  Mixed hyperlipidemia Continue Rosuvastatin 20 mg Continue low cholesterol diet and exercise.  Check lipid panel  Abnormal glucose (prediabetes) Discussed disease and risks Discussed diet/exercise, weight management  Monitor A1C q33m, defer today  Allergic state, sequela Refilled epi pen for bee stings; reminded to follow up at ER   BMI 25, adult Continue to recommend diet heavy in fruits and veggies and low in animal meats, cheeses, and dairy products, appropriate calorie intake Discuss exercise recommendations routinely Continue to monitor weight at each visit - set goal to lose 10 lb this summer  Medication management Check CBC, CMP/GFR, Magnesium  B12 deficiency In range at last check 3 months ago without supplementation  Arthralgia right shoulder Meloxicam daily for the next 2 weeks and if symptoms worsen notify the office  Numbness/tingling bilateral lower extremities Mild; patient declines meds or further workup  Major depression in remission (HCC) In remission on medication  Lifestyle discussed: diet/exerise, sleep hygiene, stress management, hydration  Vitamin D deficiency At goal at recent check; continue to recommend supplementation for goal of 70-100 Defer vitamin D level  Medication management -     CBC with Differential/Platelet -     COMPLETE METABOLIC PANEL WITH GFR -     Lipid panel -     Magnesium  Diplopia Has been evaluated by eye doctor who suggests MRI as new glasses have made no change and it persists. -     MR Brain W Wo Contrast; Future       Over 30 minutes of exam, counseling, chart review, and critical decision  making was performed  Future Appointments  Date Time Provider Department Center  04/04/2023  9:30 AM Raynelle Dick, NP [facility] None  07/05/2023 10:00 AM [name], MD [facility] None  10/08/2023 11:00 AM Raynelle Dick, NP [facility] None      Subjective:  Francisco Dixon is a 72 y.o. male who presents for  3 month follow up for HTN, hyperlipidemia, prediabetes, and vitamin D Def.   He has a diagnosis of depression and has been in remission on effexor 75 mg daily.   2015 had right rotator cuff surgery.  He has noticed that after an exercise in class he had noticed pain in his right shoulder.  He notices pain in right after sleeping, he is a side sleeper, occasionally wakes him up- sharp pain.   He has been having double vision for the past 3 months. Previously 1 year ago he was given antibiotic and it resolved but has come back 3 months ago and double vision has not gone away since.  Eye exam showed no causes and has had new glasses with no change in double vision.   He has persistent numbness/tingling of bilateral feet, not significantly limiting him. Questionably has been attributed to DDD seen on xray but he declines interventions or work up, managing with exercise and stretching, occasional NSAID/meloxicam. No recent falls  BMI is There is no height or weight on file to calculate BMI., he has been working on diet and exercise (30 min exercise tape), very active building deck, installing kitchen cabinets, landscaping, etc, minimal sitting during the day.  Wt Readings from  Last 3 Encounters:  01/01/23 161 lb 3.2 oz (73.1 kg)  09/29/22 157 lb 12.8 oz (71.6 kg)  06/26/22 161 lb 12.8 oz (73.4 kg)   His blood pressure controlled without medication, today their BP is    BP Readings from Last 3 Encounters:  01/01/23 136/78  09/29/22 124/80  06/26/22 110/70  He does workout. He denies chest pain, shortness of breath, dizziness.    He is on cholesterol medication (crestor  20 mg every day)and denies myalgias. His cholesterol is at goal. The cholesterol last visit was:   Lab Results  Component Value Date   CHOL 153 01/01/2023   HDL 52 01/01/2023   LDLCALC 76 01/01/2023   TRIG 156 (H) 01/01/2023   CHOLHDL 2.9 01/01/2023   He has been working on diet and exercise for prediabetes, and denies foot ulcerations, increased appetite, nausea, paresthesia of the feet, polydipsia, polyuria, visual disturbances, vomiting and weight loss. Last A1C in the office was:  Lab Results  Component Value Date   HGBA1C 6.0 (H) 01/01/2023   He is trying to drink more water but mostly drinks unsweetened ice tea. Last GFR Lab Results  Component Value Date   EGFR 77 01/01/2023     Patient is on Vitamin D supplement and at goal at recent check:   Lab Results  Component Value Date   VD25OH 101 (H) 01/01/2023     He was on monthly B12 injections due to history of deficiency,he stopped B12 since it was elevated at last check. Currently in normal range Lab Results  Component Value Date   VITAMINB12 388 06/26/2022     Medication Review: Current Outpatient Medications on File Prior to Visit  Medication Sig Dispense Refill   albuterol (VENTOLIN HFA) 108 (90 Base) MCG/ACT inhaler SMARTSIG:By Mouth     amphetamine-dextroamphetamine (ADDERALL) 20 MG tablet Take  1/2 to 1 tablet  2 x / day as needed for Focus & Concentration (Patient not taking: Reported on 01/01/2023) 60 tablet 0   aspirin EC 81 MG tablet Take  1 tablet  Daily     Cholecalciferol (VITAMIN D3) 125 MCG (5000 UT) CAPS Take 2 capsules Daily     Flaxseed, Linseed, (FLAXSEED OIL) 1000 MG CAPS Take 2 capsules by mouth daily.     gabapentin (NEURONTIN) 100 MG capsule Take  1 to 3 capsules  3 x /day for Neuropathy Sensations 270 capsule 0   meclizine (ANTIVERT) 25 MG tablet Take 1/2 to 1 tablet 2 to 3 x /day as needed for Dizziness / Vertigo 90 tablet 11   meloxicam (MOBIC) 15 MG tablet Take  1/2 to 1 tablet  Daily with food  for Pain & Inflammation - try limit to 5 days /week to avoid kidney damage 90 tablet 3   Omega-3 Fatty Acids (FISH OIL) 1000 MG CAPS Takes 1 capsule daily  0   rosuvastatin (CRESTOR) 20 MG tablet Take 1 tablet  every day  for Cholesterol 90 tablet 3   venlafaxine XR (EFFEXOR-XR) 75 MG 24 hr capsule TAKE ONE CAPSULE BY MOUTH DAILY FOR mood 90 capsule 3   No current facility-administered medications on file prior to visit.    Allergies: Allergies  Allergen Reactions   Other     Bee stings    Current Problems (verified) has Labile hypertension; Hyperlipidemia, mixed; Vitamin D deficiency; Medication management; Abnormal glucose; Allergy; Major depression in full remission (HCC); Numbness and tingling of both feet; B12 deficiency; and COVID-19 (12/16/2020) on their problem list.  Screening Tests Immunization History  Administered Date(s) Administered   Influenza, High Dose Seasonal PF 03/21/2021   PFIZER(Purple Top)SARS-COV-2 Vaccination 05/24/2019, 06/19/2019, 02/16/2020   Pneumococcal Conjugate-13 04/29/2018   Pneumococcal Polysaccharide-23 08/18/2019   Td 01/03/2017    Health Maintenance  Topic Date Due   Zoster Vaccines- Shingrix (1 of 2) Never done   COVID-19 Vaccine (4 - 2024-25 season) 12/17/2022   Medicare Annual Wellness (AWV)  09/29/2023   Colonoscopy  12/02/2025   DTaP/Tdap/Td (2 - Tdap) 01/04/2027   Pneumonia Vaccine 8+ Years old  Completed   Hepatitis C Screening  Completed   HPV VACCINES  Aged Out   INFLUENZA VACCINE  Discontinued     Names of Other Physician/Practitioners you currently use: 1. [facility] here for primary care 2. Dr. Freida Busman, eye doctor, last visit 08/2022 3.  Dr. Coralee North, new dentist, last visit 08/2022  Patient Care Team: [name], MD as PCP - General (Internal Medicine)  Surgical: He  has a past surgical history that includes Colonoscopy (N/A, 2002,2007,2012) and Rotator cuff repair (Right,  2015). Family His family history is not on file. Social history  He reports that he has never smoked. He has never used smokeless tobacco. He reports current alcohol use. He reports that he does not use drugs.     Objective:   There were no vitals filed for this visit.    There is no height or weight on file to calculate BMI.  General appearance: alert, no distress, WD/WN, male HEENT: normocephalic, sclerae anicteric, TMs pearly, nares patent, no discharge or erythema, pharynx normal. HOH with bilateral hearing aids.  Oral cavity: MMM, no lesions Neck: supple, no lymphadenopathy, no thyromegaly, no masses Heart: RRR, normal S1, S2, no murmurs Lungs: CTA bilaterally, no wheezes, rhonchi, or rales Abdomen: +bs, soft, non tender, non distended, no masses, no hepatomegaly, no splenomegaly Musculoskeletal: nontender, no swelling, no obvious deformity. Tenderness with pressure to anterior of shoulder, full ROM Extremities: no edema, no cyanosis, no clubbing Pulses: 2+ symmetric, upper and lower extremities, normal cap refill Neurological: alert, oriented x 3, CN2-12 intact, strength normal upper extremities and lower extremities, sensation diminished bil feet/toes, DTRs 2+ throughout, no cerebellar signs, gait normal Psychiatric: normal affect, behavior normal, pleasant     Raynelle Dick, NP   04/03/2023

## 2023-04-04 ENCOUNTER — Encounter: Payer: Self-pay | Admitting: Nurse Practitioner

## 2023-04-04 ENCOUNTER — Ambulatory Visit (INDEPENDENT_AMBULATORY_CARE_PROVIDER_SITE_OTHER): Payer: Medicare Other | Admitting: Nurse Practitioner

## 2023-04-04 VITALS — BP 112/70 | HR 65 | Temp 97.5°F | Ht 67.0 in | Wt 165.6 lb

## 2023-04-04 DIAGNOSIS — B070 Plantar wart: Secondary | ICD-10-CM | POA: Diagnosis not present

## 2023-04-04 DIAGNOSIS — R202 Paresthesia of skin: Secondary | ICD-10-CM

## 2023-04-04 DIAGNOSIS — R200 Anesthesia of skin: Secondary | ICD-10-CM

## 2023-04-04 DIAGNOSIS — E538 Deficiency of other specified B group vitamins: Secondary | ICD-10-CM | POA: Diagnosis not present

## 2023-04-04 DIAGNOSIS — F3342 Major depressive disorder, recurrent, in full remission: Secondary | ICD-10-CM

## 2023-04-04 DIAGNOSIS — M2550 Pain in unspecified joint: Secondary | ICD-10-CM

## 2023-04-04 DIAGNOSIS — R7309 Other abnormal glucose: Secondary | ICD-10-CM

## 2023-04-04 DIAGNOSIS — Z79899 Other long term (current) drug therapy: Secondary | ICD-10-CM

## 2023-04-04 DIAGNOSIS — R0989 Other specified symptoms and signs involving the circulatory and respiratory systems: Secondary | ICD-10-CM | POA: Diagnosis not present

## 2023-04-04 DIAGNOSIS — W57XXXA Bitten or stung by nonvenomous insect and other nonvenomous arthropods, initial encounter: Secondary | ICD-10-CM | POA: Diagnosis not present

## 2023-04-04 DIAGNOSIS — E559 Vitamin D deficiency, unspecified: Secondary | ICD-10-CM

## 2023-04-04 DIAGNOSIS — Z6825 Body mass index (BMI) 25.0-25.9, adult: Secondary | ICD-10-CM

## 2023-04-04 DIAGNOSIS — T7840XS Allergy, unspecified, sequela: Secondary | ICD-10-CM

## 2023-04-04 DIAGNOSIS — S30860A Insect bite (nonvenomous) of lower back and pelvis, initial encounter: Secondary | ICD-10-CM | POA: Diagnosis not present

## 2023-04-04 DIAGNOSIS — E782 Mixed hyperlipidemia: Secondary | ICD-10-CM | POA: Diagnosis not present

## 2023-04-04 MED ORDER — FLUTICASONE PROPIONATE 50 MCG/ACT NA SUSP
2.0000 | Freq: Every day | NASAL | 1 refills | Status: DC
Start: 2023-04-04 — End: 2023-07-09

## 2023-04-04 NOTE — Patient Instructions (Addendum)
Use Mucinex and generic allegra or Zyrtec x 2 weeks Also use Flonase 2 sprays each nostril 1-2 times a day  General Headache Without Cause A headache is pain or discomfort you feel around the head or neck area. There are many causes and types of headaches. In some cases, the cause may not be found. Follow these instructions at home: Watch your condition for any changes. Let your doctor know about them. Take these steps to help with your condition: Managing pain     Take over-the-counter and prescription medicines only as told by your doctor. This includes medicines for pain that are taken by mouth or put on the skin. Lie down in a dark, quiet room when you have a headache. If told, put ice on your head and neck area: Put ice in a plastic bag. Place a towel between your skin and the bag. Leave the ice on for 20 minutes, 2-3 times per day. Take off the ice if your skin turns bright red. This is very important. If you cannot feel pain, heat, or cold, you have a greater risk of damage to the area. If told, put heat on the affected area. Use the heat source that your doctor recommends, such as a moist heat pack or a heating pad. Place a towel between your skin and the heat source. Leave the heat on for 20-30 minutes. Take off the heat if your skin turns bright red. This is very important. If you cannot feel pain, heat, or cold, you have a greater risk of getting burned. Keep lights dim if bright lights bother you or make your headaches worse. Eating and drinking Eat meals on a regular schedule. If you drink alcohol: Limit how much you have to: 0-1 drink a day for women who are not pregnant. 0-2 drinks a day for men. Know how much alcohol is in a drink. In the [place] one drink equals one 12 oz bottle of beer (355 mL), one 5 oz glass of wine (148 mL), or one 1 oz glass of hard liquor (44 mL). Stop drinking caffeine, or drink less caffeine. General instructions  Keep a journal to find out if  certain things bring on headaches. For example, write down: What you eat and drink. How much sleep you get. Any change to your diet or medicines. Get a massage or try other ways to relax. Limit stress. Sit up straight. Do not tighten (tense) your muscles. Do not smoke or use any products that contain nicotine or tobacco. If you need help quitting, ask your doctor. Exercise regularly as told by your doctor. Get enough sleep. This often means 7-9 hours of sleep each night. Keep all follow-up visits. This is important. Contact a doctor if: Medicine does not help your symptoms. You have a headache that feels different than the other headaches. You feel like you may vomit (nauseous) or you vomit. You have a fever. Get help right away if: Your headache: Gets very bad quickly. Gets worse after a lot of physical activity. You have any of these symptoms: You continue to vomit. A stiff neck. Trouble seeing. Your eye or ear hurts. Trouble speaking. Weak muscles or you lose muscle control. You lose your balance or have trouble walking. You feel like you will pass out (faint) or you pass out. You are mixed up (confused). You have a seizure. These symptoms may be an emergency. Get help right away. Call your local emergency services (911 in the [place] Do not wait to  see if the symptoms will go away. Do not drive yourself to the hospital. Summary A headache is pain or discomfort that is felt around the head or neck area. There are many causes and types of headaches. In some cases, the cause may not be found. Keep a journal to help find out what causes your headaches. Watch your condition for any changes. Let your doctor know about them. Contact a doctor if you have a headache that is different from usual, or if medicine does not help your headache. Get help right away if your headache gets very bad, you throw up, you have trouble seeing, you lose your balance, or you have a seizure. This  information is not intended to replace advice given to you by your health care provider. Make sure you discuss any questions you have with your health care provider. Document Revised: 09/01/2020 Document Reviewed: 09/01/2020 Elsevier Patient Education  2024 [employer].

## 2023-04-10 LAB — CBC WITH DIFFERENTIAL/PLATELET
Absolute Lymphocytes: 1373 {cells}/uL (ref 850–3900)
Absolute Monocytes: 468 {cells}/uL (ref 200–950)
Basophils Absolute: 57 {cells}/uL (ref 0–200)
Basophils Relative: 1.1 %
Eosinophils Absolute: 177 {cells}/uL (ref 15–500)
Eosinophils Relative: 3.4 %
HCT: 44.3 % (ref 38.5–50.0)
Hemoglobin: 14.6 g/dL (ref 13.2–17.1)
MCH: 29.8 pg (ref 27.0–33.0)
MCHC: 33 g/dL (ref 32.0–36.0)
MCV: 90.4 fL (ref 80.0–100.0)
MPV: 10.1 fL (ref 7.5–12.5)
Monocytes Relative: 9 %
Neutro Abs: 3125 {cells}/uL (ref 1500–7800)
Neutrophils Relative %: 60.1 %
Platelets: 253 10*3/uL (ref 140–400)
RBC: 4.9 10*6/uL (ref 4.20–5.80)
RDW: 12.3 % (ref 11.0–15.0)
Total Lymphocyte: 26.4 %
WBC: 5.2 10*3/uL (ref 3.8–10.8)

## 2023-04-10 LAB — TICK-BORNE,AB PANEL
A. phagocytophila IgG Abs: <1:64 {titer}
A. phagocytophila IgM Abs: <1:20 {titer}
B burgdorferi Ab IgG+IgM: <0.9 {index}
Babesia Duncani(WA1)Antibody(IgG),IFA: <1:256 {titer}
Babesia microti IgG: <1:64 {titer}
Babesia microti IgM: <1:20 {titer}
E. CHAFFEENSIS AB IGG: <1:64 {titer}
E. CHAFFEENSIS AB IGM: <1:20 {titer}

## 2023-04-10 LAB — COMPLETE METABOLIC PANEL WITH GFR
AG Ratio: 1.6 (calc) (ref 1.0–2.5)
ALT: 17 U/L (ref 9–46)
AST: 17 U/L (ref 10–35)
Albumin: 4.3 g/dL (ref 3.6–5.1)
Alkaline phosphatase (APISO): 50 U/L (ref 35–144)
BUN: 16 mg/dL (ref 7–25)
CO2: 32 mmol/L (ref 20–32)
Calcium: 10.1 mg/dL (ref 8.6–10.3)
Chloride: 104 mmol/L (ref 98–110)
Creat: 0.98 mg/dL (ref 0.70–1.28)
Globulin: 2.7 g/dL (ref 1.9–3.7)
Glucose, Bld: 86 mg/dL (ref 65–99)
Potassium: 4.4 mmol/L (ref 3.5–5.3)
Sodium: 143 mmol/L (ref 135–146)
Total Bilirubin: 0.6 mg/dL (ref 0.2–1.2)
Total Protein: 7 g/dL (ref 6.1–8.1)
eGFR: 82 mL/min/{1.73_m2} (ref >=60)

## 2023-04-10 LAB — VITAMIN B12: Vitamin B-12: 406 pg/mL (ref 200–1100)

## 2023-04-10 LAB — MAGNESIUM: Magnesium: 2.3 mg/dL (ref 1.5–2.5)

## 2023-04-10 LAB — TSH: TSH: 2.01 m[IU]/L (ref 0.40–4.50)

## 2023-04-10 LAB — LIPID PANEL
Cholesterol: 153 mg/dL (ref <200)
HDL: 52 mg/dL (ref >=40)
LDL Cholesterol (Calc): 79 mg/dL
Non-HDL Cholesterol (Calc): 101 mg/dL (ref <130)
Total CHOL/HDL Ratio: 2.9 (calc) (ref <5.0)
Triglycerides: 120 mg/dL (ref <150)

## 2023-04-15 ENCOUNTER — Encounter: Payer: Self-pay | Admitting: Internal Medicine

## 2023-04-16 ENCOUNTER — Other Ambulatory Visit: Payer: Self-pay | Admitting: Internal Medicine

## 2023-04-16 ENCOUNTER — Other Ambulatory Visit: Payer: Self-pay | Admitting: Nurse Practitioner

## 2023-04-16 DIAGNOSIS — M2550 Pain in unspecified joint: Secondary | ICD-10-CM

## 2023-04-16 MED ORDER — SULFAMETHOXAZOLE-TRIMETHOPRIM 800-160 MG PO TABS
ORAL_TABLET | ORAL | 0 refills | Status: DC
Start: 2023-04-16 — End: 2023-07-09

## 2023-04-19 DIAGNOSIS — H6123 Impacted cerumen, bilateral: Secondary | ICD-10-CM | POA: Diagnosis not present

## 2023-04-19 DIAGNOSIS — Z6826 Body mass index (BMI) 26.0-26.9, adult: Secondary | ICD-10-CM | POA: Diagnosis not present

## 2023-04-24 ENCOUNTER — Ambulatory Visit (INDEPENDENT_AMBULATORY_CARE_PROVIDER_SITE_OTHER): Payer: Medicare Other | Admitting: Internal Medicine

## 2023-04-24 ENCOUNTER — Encounter: Payer: Self-pay | Admitting: Internal Medicine

## 2023-04-24 VITALS — BP 124/70 | HR 81 | Temp 97.9°F | Resp 18 | Ht 67.0 in | Wt 161.4 lb

## 2023-04-24 DIAGNOSIS — J329 Chronic sinusitis, unspecified: Secondary | ICD-10-CM

## 2023-04-24 DIAGNOSIS — J40 Bronchitis, not specified as acute or chronic: Secondary | ICD-10-CM | POA: Diagnosis not present

## 2023-04-24 DIAGNOSIS — Z23 Encounter for immunization: Secondary | ICD-10-CM | POA: Diagnosis not present

## 2023-04-24 DIAGNOSIS — Z882 Allergy status to sulfonamides status: Secondary | ICD-10-CM

## 2023-04-24 MED ORDER — DEXAMETHASONE 4 MG PO TABS
ORAL_TABLET | ORAL | 0 refills | Status: DC
Start: 2023-04-24 — End: 2023-05-16

## 2023-04-24 MED ORDER — CLARITHROMYCIN 500 MG PO TABS
ORAL_TABLET | ORAL | 0 refills | Status: DC
Start: 2023-04-24 — End: 2023-07-09

## 2023-04-24 NOTE — Patient Instructions (Signed)
 Acute Bronchitis Acute bronchitis is sudden inflammation of the main airways (bronchi) that come off the windpipe (trachea) in the lungs. The swelling causes the airways to get smaller and make more mucus than normal. This can make it hard to breathe and can cause coughing or noisy breathing (wheezing). Acute bronchitis may last several weeks. The cough may last longer. Allergies, asthma, and exposure to smoke may make the condition worse. What are the causes? This condition can be caused by germs and by substances that irritate the lungs, including: Cold and flu viruses. The most common cause of this condition is the virus that causes the common cold. Bacteria. This is less common. Breathing in substances that irritate the lungs, including: Smoke from cigarettes and other forms of tobacco. Dust and pollen. Fumes from household cleaning products, gases, or burned fuel. Indoor or outdoor air pollution. What increases the risk? The following factors may make you more likely to develop this condition: A weak body's defense system, also called the immune system. A condition that affects your lungs and breathing, such as asthma. What are the signs or symptoms? Common symptoms of this condition include: Coughing. This may bring up clear, yellow, or green mucus from your lungs (sputum). Wheezing. Runny or stuffy nose. Having too much mucus in your lungs (chest congestion). Shortness of breath. Aches and pains, including sore throat or chest. How is this diagnosed? This condition is usually diagnosed based on: Your symptoms and medical history. A physical exam. You may also have other tests, including tests to rule out other conditions, such as pneumonia. These tests include: A test of lung function. Test of a mucus sample to look for the presence of bacteria. Tests to check the oxygen level in your blood. Blood tests. Chest X-ray. How is this treated? Most cases of acute bronchitis clear  up over time without treatment. Your health care provider may recommend: Drinking more fluids to help thin your mucus so it is easier to cough up. Taking inhaled medicine (inhaler) to improve air flow in and out of your lungs. Using a vaporizer or a humidifier. These are machines that add water to the air to help you breathe better. Taking a medicine that thins mucus and clears congestion (expectorant). Taking a medicine that prevents or stops coughing (cough suppressant). It is not common to take an antibiotic medicine for this condition. Follow these instructions at home:  Take over-the-counter and prescription medicines only as told by your health care provider. Use an inhaler, vaporizer, or humidifier as told by your health care provider. Take two teaspoons (10 mL) of honey at bedtime to lessen coughing at night. Drink enough fluid to keep your urine pale yellow. Do not use any products that contain nicotine or tobacco. These products include cigarettes, chewing tobacco, and vaping devices, such as e-cigarettes. If you need help quitting, ask your health care provider. Get plenty of rest. Return to your normal activities as told by your health care provider. Ask your health care provider what activities are safe for you. Keep all follow-up visits. This is important. How is this prevented? To lower your risk of getting this condition again: Wash your hands often with soap and water for at least 20 seconds. If soap and water are not available, use hand sanitizer. Avoid contact with people who have cold symptoms. Try not to touch your mouth, nose, or eyes with your hands. Avoid breathing in smoke or chemical fumes. Breathing smoke or chemical fumes will make your condition  worse. Get the flu shot every year. Contact a health care provider if: Your symptoms do not improve after 2 weeks. You have trouble coughing up the mucus. Your cough keeps you awake at night. You have a fever. Get  help right away if you: Cough up blood. Feel pain in your chest. Have severe shortness of breath. Faint or keep feeling like you are going to faint. Have a severe headache. Have a fever or chills that get worse. These symptoms may represent a serious problem that is an emergency. Do not wait to see if the symptoms will go away. Get medical help right away. Call your local emergency services (911 in the [place] Do not drive yourself to the hospital. Summary Acute bronchitis is inflammation of the main airways (bronchi) that come off the windpipe (trachea) in the lungs. The swelling causes the airways to get smaller and make more mucus than normal. Drinking more fluids can help thin your mucus so it is easier to cough up. Take over-the-counter and prescription medicines only as told by your health care provider. Do not use any products that contain nicotine or tobacco. These products include cigarettes, chewing tobacco, and vaping devices, such as e-cigarettes. If you need help quitting, ask your health care provider. Contact a health care provider if your symptoms do not improve after 2 weeks. This information is not intended to replace advice given to you by your health care provider. Make sure you discuss any questions you have with your health care provider. Document Revised: 07/14/2021 Document Reviewed: 08/04/2020 Elsevier Patient Education  2024 [employer].

## 2023-04-24 NOTE — Progress Notes (Signed)
 Francisco Dixon      ADULT   &   ADOLESCENT      INTERNAL MEDICINE  Francisco Dixon, M.D.          Lonell Rous, ANP        Tonya Cranford, FNP  [facility] [address]  [place], [place]. [address] Telephone [phone] Telefax [phone]  Future Appointments  Date Time Provider Department  07/05/2023                                  cpe 10:00 AM [name], MD [facility]  10/08/2023                                 wellness 11:00 AM Dixon, Francisco E, NP [facility]    History of Present Illness:     This very nice 73 y.o. [name]  with labile HTN, HLD, Pre-Diabetes and Vitamin D  Deficiency  was treated with Septra  on Dec 30 for a sinusitis and subsequently developed a generalized truncal / extremity morbilliform pruritic rash. He stopped the Septra , but relates persistent head /sinus & chest congestion.     Current Outpatient Medications on File Prior to Visit  Medication Sig   albuterol (VENTOLIN HFA) 108 (90 Base) MCG/ACT inhaler SMARTSIG:By Mouth   aspirin  EC 81 MG tablet Take  1 tablet  Daily   Cholecalciferol (VITAMIN D3) 125 MCG (5000 UT) CAPS Take 2 capsules Daily   Flaxseed, Linseed, (FLAXSEED OIL) 1000 MG CAPS Take 2 capsules by mouth daily.   fluticasone  (FLONASE ) 50 MCG/ACT nasal spray Place 2 sprays into both nostrils at bedtime.   meclizine  (ANTIVERT ) 25 MG tablet Take 1/2 to 1 tablet 2 to 3 x /day as needed for Dizziness / Vertigo   meloxicam  (MOBIC ) 15 MG tablet Take ONE-HALF TO ONE tablet BY MOUTH Daily with food FOR Pain & Inflammation - try TO limit TO FIVE DAYS /week TO AVOID kidney damage]   Omega-3 Fatty Acids (FISH OIL ) 1000 MG CAPS Takes 1 capsule daily   rosuvastatin  (CRESTOR ) 20 MG tablet Take 1 tablet  every day  for Cholesterol   sulfamethoxazole -trimethoprim  (BACTRIM  DS) 800-160 MG tablet Take   1 tablet  2 x / day with Food for Sinus Infection   venlafaxine  XR (EFFEXOR -XR) 75 MG 24 hr capsule TAKE ONE CAPSULE BY MOUTH DAILY  FOR mood   No current facility-administered medications on file prior to visit.    Allergies  Allergen Reactions   Other     Bee stings     Problem list He has Labile hypertension; Hyperlipidemia, mixed; Vitamin D  deficiency; Medication management; Abnormal glucose; Allergy; Major depression in full remission (HCC); Numbness and tingling of both feet; B12 deficiency; and COVID-19 (12/16/2020) on their problem list.   Observations/Objective:  BP 124/70   Pulse 81   Temp 97.9 F (36.6 C)   Resp 18   Ht 5' 7 (1.702 m)   Wt 161 lb 6.4 oz (73.2 kg)   SpO2 98%   BMI 25.28 kg/m   HEENT - EACs / TMs- Nl/ N/O/P - clear. Sl tender Fronto-Maxillary sinus areas.  Neck - supple.  Chest - Scattered coarse rales & rhonchi - No Wheezes.  Cor - Nl HS. RRR w/o sig MGR. PP 1(+). No edema. MS- FROM w/o deformities.  Gait Nl. Neuro -  Nl w/o  focal abnormalities.   Assessment and Plan:   1. Sinobronchitis (Primary)  - clarithromycin  (BIAXIN ) 500 MG tablet;    Take  1 tablet  2 x / day with Food for Infection    Dispense: 20 tablet; Refill: 0  - dexamethasone  (DECADRON ) 4 MG tablet;    Take 1 tab 3 x day - 3 days, then 2 x day - 3 days, then 1 tab daily     Dispense: 20 tablet; Refill: 0   2. Need for influenza vaccination  - Flu vaccine HIGH DOSE PF(Fluzone Trivalent)   3. Allergy to sulfa  drugs  - dexamethasone  (DECADRON ) 4 MG tablet;   Take 1 tab 3 x day - 3 days, then 2 x day - 3 days, then 1 tab daily     Dispense: 20 tablet; Refill: 0   Follow Up Instructions:        I discussed the assessment and treatment plan with the patient. The patient was provided an opportunity to ask questions and all were answered. The patient agreed with the plan and demonstrated an understanding of the instructions.       The patient was advised to call back or seek an in-person evaluation if the symptoms worsen or if the condition fails to improve as anticipated.    Francisco JONETTA Richards,  MD

## 2023-05-16 ENCOUNTER — Ambulatory Visit (INDEPENDENT_AMBULATORY_CARE_PROVIDER_SITE_OTHER): Payer: Medicare Other | Admitting: Nurse Practitioner

## 2023-05-16 VITALS — BP 148/84 | HR 82 | Temp 97.9°F | Resp 16 | Ht 67.0 in | Wt 162.4 lb

## 2023-05-16 DIAGNOSIS — R52 Pain, unspecified: Secondary | ICD-10-CM | POA: Diagnosis not present

## 2023-05-16 DIAGNOSIS — R600 Localized edema: Secondary | ICD-10-CM | POA: Diagnosis not present

## 2023-05-16 DIAGNOSIS — M25562 Pain in left knee: Secondary | ICD-10-CM

## 2023-05-16 MED ORDER — PREDNISONE 10 MG PO TABS: ORAL_TABLET | ORAL | 0 refills | Status: DC

## 2023-05-16 NOTE — Progress Notes (Signed)
Assessment and Plan:  Francisco Dixon was seen today for an episodic visit.  Diagnoses and all order for this visit:  1. Acute pain of left knee (Primary) Obtain left knee x-ray for further review and evaluation of underlying etiology including OA versus spur versus meniscus tear Possible MRI and/or PT if x-ray negative. RICE Method OTC pain medications PRN  - DG Knee Complete 4 Views Left; Future  2. Pain aggravated by walking/localized edem Discussed importance of brace support along with assistive device to decrease risk for falls.  - DG Knee Complete 4 Views Left; Future  Notify office for further evaluation and treatment, questions or concerns if s/s fail to improve. The risks and benefits of my recommendations, as well as other treatment options were discussed with the patient today. Questions were answered.  Further disposition pending results of labs. Discussed med's effects and SE's.    Over 20 minutes of exam, counseling, chart review, and critical decision making was performed.   Future Appointments  Date Time Provider Department Center  07/05/2023 10:00 AM [name], MD [facility] None  10/08/2023 11:00 AM Raynelle Dick, NP [facility] None    ------------------------------------------------------------------------------------------------------------------   HPI BP (!) 148/84   Pulse 82   Temp 97.9 F (36.6 C)   Resp 16   Ht 5\' 7"  (1.702 m)   Wt 162 lb 6.4 oz (73.7 kg)   SpO2 97%   BMI 25.44 kg/m    Patient presents with knee pain involving the left knee. Onset of the symptoms was several weeks ago. Inciting event: this is a longstanding problem which has been getting worse. Current symptoms include crepitus sensation, giving out, pain located on the medial side of the knee and above patella, and swelling. Pain is aggravated by going up and down stairs, kneeling, and squatting. Patient has had no prior knee problems. Evaluation to date: none.  Treatment to date: ice and rest.  Past Medical History:  Diagnosis Date   GERD (gastroesophageal reflux disease)    Hypercholesterolemia    Hypertension    labile    Plantar fasciitis, right 12/07/2016   Vitamin D deficiency      Allergies  Allergen Reactions   Sulfa Antibiotics Rash   Other     Bee stings    Current Outpatient Medications on File Prior to Visit  Medication Sig   albuterol (VENTOLIN HFA) 108 (90 Base) MCG/ACT inhaler SMARTSIG:By Mouth   aspirin EC 81 MG tablet Take  1 tablet  Daily   Cholecalciferol (VITAMIN D3) 125 MCG (5000 UT) CAPS Take 2 capsules Daily   clarithromycin (BIAXIN) 500 MG tablet Take  1 tablet  2 x / day with Food for Infection2   Flaxseed, Linseed, (FLAXSEED OIL) 1000 MG CAPS Take 2 capsules by mouth daily.   fluticasone (FLONASE) 50 MCG/ACT nasal spray Place 2 sprays into both nostrils at bedtime.   meclizine (ANTIVERT) 25 MG tablet Take 1/2 to 1 tablet 2 to 3 x /day as needed for Dizziness / Vertigo   meloxicam (MOBIC) 15 MG tablet Take ONE-HALF TO ONE tablet BY MOUTH Daily with food FOR Pain & Inflammation - try TO limit TO FIVE DAYS /week TO AVOID kidney damage]   Omega-3 Fatty Acids (FISH OIL) 1000 MG CAPS Takes 1 capsule daily   rosuvastatin (CRESTOR) 20 MG tablet Take 1 tablet  every day  for Cholesterol   sulfamethoxazole-trimethoprim (BACTRIM DS) 800-160 MG tablet Take   1 tablet  2 x / day with Food  for Sinus Infection   venlafaxine XR (EFFEXOR-XR) 75 MG 24 hr capsule TAKE ONE CAPSULE BY MOUTH DAILY FOR mood   dexamethasone (DECADRON) 4 MG tablet Take 1 tab 3 x day - 3 days, then 2 x day - 3 days, then 1 tab daily (Patient not taking: Reported on 05/16/2023)   No current facility-administered medications on file prior to visit.    ROS: all negative except what is noted in the HPI.   Physical Exam:  BP (!) 148/84   Pulse 82   Temp 97.9 F (36.6 C)   Resp 16   Ht 5\' 7"  (1.702 m)   Wt 162 lb 6.4 oz (73.7 kg)   SpO2 97%   BMI  25.44 kg/m   General Appearance: NAD.  Awake, conversant and cooperative. Eyes: PERRLA, EOMs intact.  Sclera white.  Conjunctiva without erythema. Sinuses: No frontal/maxillary tenderness.  No nasal discharge. Nares patent.  ENT/Mouth: Ext aud canals clear.  Bilateral TMs w/DOL and without erythema or bulging. Hearing intact.  Posterior pharynx without swelling or exudate.  Tonsils without swelling or erythema.  Neck: Supple.  No masses, nodules or thyromegaly. Respiratory: Effort is regular with non-labored breathing. Breath sounds are equal bilaterally without rales, rhonchi, wheezing or stridor.  Cardio: RRR with no MRGs. Brisk peripheral pulses without edema.  Abdomen: Active BS in all four quadrants.  Soft and non-tender without guarding, rebound tenderness, hernias or masses. Lymphatics: Non tender without lymphadenopathy.  Musculoskeletal: Full ROM, 5/5 strength, normal ambulation.  No clubbing or cyanosis. Skin: Appropriate color for ethnicity. Warm without rashes, lesions, ecchymosis, ulcers.  Neuro: CN II-XII grossly normal. Normal muscle tone without cerebellar symptoms and intact sensation.   Psych: AO X 3,  appropriate mood and affect, insight and judgment.     Adela Glimpse, NP 4:28 PM [facility]

## 2023-05-17 ENCOUNTER — Ambulatory Visit
Admission: RE | Admit: 2023-05-17 | Discharge: 2023-05-17 | Disposition: A | Discharge status: Home / Self Care | Payer: Medicare Other | Source: Ambulatory Visit | Attending: Nurse Practitioner | Admitting: Nurse Practitioner

## 2023-05-17 DIAGNOSIS — M25562 Pain in left knee: Secondary | ICD-10-CM

## 2023-05-17 DIAGNOSIS — R52 Pain, unspecified: Secondary | ICD-10-CM

## 2023-05-17 DIAGNOSIS — R600 Localized edema: Secondary | ICD-10-CM

## 2023-05-20 ENCOUNTER — Encounter: Payer: Self-pay | Admitting: Nurse Practitioner

## 2023-05-20 NOTE — Patient Instructions (Signed)
 Acute Knee Pain, Adult Many things can cause knee pain. Sometimes, knee pain is sudden (acute). It may be caused by damage, swelling, or irritation of the muscles and tissues that support your knee. Pain may come from: A fall. An injury to the knee from twisting motions. A hit to the knee. Infection. The pain often goes away on its own with time and rest. If the pain does not go away, tests may be done to find out what is causing the pain. These may include: Imaging tests, such as an X-ray, MRI, CT scan, or ultrasound. Joint aspiration. In this test, fluid is removed from the knee and checked. Arthroscopy. In this test, a lighted tube is put in the knee and an image is shown on a screen. A biopsy. In this test, a health care provider will remove a small piece of tissue for testing. Follow these instructions at home: If you have a knee sleeve or brace that can be taken off:  Wear the knee sleeve or brace as told by your provider. Take it off only if your provider says that you can. Check the skin around it every day. Tell your provider if you see problems. Loosen the knee sleeve or brace if your toes tingle, are numb, or turn cold and blue. Keep the knee sleeve or brace clean and dry. Bathing If the knee sleeve or brace is not waterproof: Do not let it get wet. Cover it when you take a bath or shower. Use a cover that does not let any water in. Managing pain, stiffness, and swelling  If told, put ice on the area. If you have a knee sleeve or brace that you can take off, remove it as told. Put ice in a plastic bag. Place a towel between your skin and the bag. Leave the ice on for 20 minutes, 2-3 times a day. If your skin turns bright red, take off the ice right away to prevent skin damage. The risk of damage is higher if you cannot feel pain, heat, or cold. Move your toes often to reduce stiffness and swelling. Raise the injured area above the level of your heart while you are sitting  or lying down. Use a pillow to support your foot as needed. If told, use an elastic bandage to put pressure (compression) on your injured knee. This may control swelling, give support, and help with discomfort. Sleep with a pillow under your knee. Activity Rest your knee. Do not do things that cause pain or make pain worse. Do not stand or walk on your injured knee until you're told it's okay. Use crutches as told. Avoid activities where both feet leave the ground at the same time and put stress on the joints. Avoid running, jumping rope, and doing jumping jacks. Work with a physical therapist to make a safe exercise program if told. Physical therapy helps your knee move better and get stronger. Exercise as told. General instructions Take your medicines only as told by your provider. If you are overweight, work with your provider and an expert in healthy eating, called a dietician, to set goals to lose weight. Being overweight can make your knee hurt more. Do not smoke, vape, or use products with nicotine or tobacco in them. If you need help quitting, talk with your provider. Return to normal activities when you are told. Ask what things are safe for you to do. Watch for any changes in your symptoms. Keep all follow-up visits. Your provider will check  your healing and adjust treatments if needed. Contact a health care provider if: The knee pain does not stop. The knee pain changes or gets worse. You have a fever along with knee pain. Your knee is red or feels warm when you touch it. Your knee gives out or locks up. Get help right away if: Your knee swells and the swelling gets worse. You cannot move your knee. You have very bad knee pain that does not get better with medicine. This information is not intended to replace advice given to you by your health care provider. Make sure you discuss any questions you have with your health care provider. Document Revised: 01/04/2023 Document  Reviewed: 05/29/2022 Elsevier Patient Education  2024 [employer].

## 2023-05-21 ENCOUNTER — Encounter: Payer: Self-pay | Admitting: Nurse Practitioner

## 2023-05-22 ENCOUNTER — Other Ambulatory Visit: Payer: Self-pay | Admitting: Nurse Practitioner

## 2023-05-22 DIAGNOSIS — B029 Zoster without complications: Secondary | ICD-10-CM

## 2023-05-22 DIAGNOSIS — R600 Localized edema: Secondary | ICD-10-CM

## 2023-05-22 DIAGNOSIS — M25562 Pain in left knee: Secondary | ICD-10-CM

## 2023-05-22 DIAGNOSIS — R52 Pain, unspecified: Secondary | ICD-10-CM

## 2023-05-22 MED ORDER — FAMCICLOVIR 500 MG PO TABS
500.0000 mg | ORAL_TABLET | Freq: Three times a day (TID) | ORAL | 0 refills | Status: DC
Start: 2023-05-22 — End: 2023-07-09

## 2023-05-30 ENCOUNTER — Ambulatory Visit
Admission: RE | Admit: 2023-05-30 | Discharge: 2023-05-30 | Disposition: A | Discharge status: Home / Self Care | Payer: Medicare Other | Source: Ambulatory Visit | Attending: Nurse Practitioner | Admitting: Nurse Practitioner

## 2023-05-30 ENCOUNTER — Telehealth: Payer: Self-pay | Admitting: Family Medicine

## 2023-05-30 DIAGNOSIS — M23612 Other spontaneous disruption of anterior cruciate ligament of left knee: Secondary | ICD-10-CM | POA: Diagnosis not present

## 2023-05-30 DIAGNOSIS — M23322 Other meniscus derangements, posterior horn of medial meniscus, left knee: Secondary | ICD-10-CM | POA: Diagnosis not present

## 2023-05-30 DIAGNOSIS — R600 Localized edema: Secondary | ICD-10-CM

## 2023-05-30 DIAGNOSIS — M25562 Pain in left knee: Secondary | ICD-10-CM

## 2023-05-30 DIAGNOSIS — R52 Pain, unspecified: Secondary | ICD-10-CM

## 2023-05-30 DIAGNOSIS — M84462A Pathological fracture, left tibia, initial encounter for fracture: Secondary | ICD-10-CM | POA: Diagnosis not present

## 2023-05-30 NOTE — Telephone Encounter (Signed)
Copied from CRM [id number]. Topic: Appointments - Scheduling Inquiry for Clinic >> May 30, 2023  3:45 PM Nila Nephew wrote: Reason for CRM: Patient calling to request to establish with Dr. Linford Arnold. Current PCP has passed.

## 2023-06-06 NOTE — Telephone Encounter (Signed)
Ok to estab

## 2023-07-02 ENCOUNTER — Encounter: Payer: Medicare Other | Admitting: Internal Medicine

## 2023-07-05 ENCOUNTER — Encounter: Payer: Medicare Other | Admitting: Internal Medicine

## 2023-07-09 ENCOUNTER — Encounter: Payer: Self-pay | Admitting: Internal Medicine

## 2023-07-09 ENCOUNTER — Encounter: Payer: Self-pay | Admitting: Family Medicine

## 2023-07-09 ENCOUNTER — Ambulatory Visit (INDEPENDENT_AMBULATORY_CARE_PROVIDER_SITE_OTHER): Payer: Medicare Other | Admitting: Family Medicine

## 2023-07-09 VITALS — BP 140/75 | HR 54 | Ht 67.0 in | Wt 164.2 lb

## 2023-07-09 DIAGNOSIS — E782 Mixed hyperlipidemia: Secondary | ICD-10-CM | POA: Diagnosis not present

## 2023-07-09 DIAGNOSIS — Q825 Congenital non-neoplastic nevus: Secondary | ICD-10-CM | POA: Diagnosis not present

## 2023-07-09 DIAGNOSIS — R7301 Impaired fasting glucose: Secondary | ICD-10-CM | POA: Insufficient documentation

## 2023-07-09 DIAGNOSIS — F3342 Major depressive disorder, recurrent, in full remission: Secondary | ICD-10-CM | POA: Diagnosis not present

## 2023-07-09 DIAGNOSIS — R0989 Other specified symptoms and signs involving the circulatory and respiratory systems: Secondary | ICD-10-CM

## 2023-07-09 DIAGNOSIS — H9193 Unspecified hearing loss, bilateral: Secondary | ICD-10-CM

## 2023-07-09 DIAGNOSIS — H9190 Unspecified hearing loss, unspecified ear: Secondary | ICD-10-CM | POA: Insufficient documentation

## 2023-07-09 NOTE — Assessment & Plan Note (Signed)
 Due to recheck A1C   Lab Results  Component Value Date   HGBA1C 5.6 07/09/2023

## 2023-07-09 NOTE — Assessment & Plan Note (Signed)
 Doing well on Effexor. Has tried several meds in the past  and this has worked the best for him. Happy with current regimen.

## 2023-07-09 NOTE — Assessment & Plan Note (Signed)
Wears hearing aids 

## 2023-07-09 NOTE — Progress Notes (Unsigned)
 New Patient Office Visit  Subjective    Patient ID: Francisco Dixon, male    DOB: March 01, 1951  Age: 73 y.o. MRN: [id number]  CC:  Chief Complaint  Patient presents with   Establish Care    HPI Francisco Dixon presents to establish care.   C/o of Left knee pain. Had MRI and has 2 meniscal tears. Not quite ready for surgery. Uses meloxicam.    Hx of anxiety - on Effexor and happy with his regimen. No recent changes.    Outpatient Encounter Medications as of 07/09/2023  Medication Sig   aspirin EC 81 MG tablet Take  1 tablet  Daily   Cholecalciferol (VITAMIN D3) 125 MCG (5000 UT) CAPS Take 2 capsules Daily   Flaxseed, Linseed, (FLAXSEED OIL) 1000 MG CAPS Take 2 capsules by mouth daily.   meloxicam (MOBIC) 15 MG tablet Take ONE-HALF TO ONE tablet BY MOUTH Daily with food FOR Pain & Inflammation - try TO limit TO FIVE DAYS /week TO AVOID kidney damage]   Omega-3 Fatty Acids (FISH OIL) 1000 MG CAPS Takes 1 capsule daily   rosuvastatin (CRESTOR) 20 MG tablet Take 1 tablet  every day  for Cholesterol   venlafaxine XR (EFFEXOR-XR) 75 MG 24 hr capsule TAKE ONE CAPSULE BY MOUTH DAILY FOR mood   [DISCONTINUED] albuterol (VENTOLIN HFA) 108 (90 Base) MCG/ACT inhaler SMARTSIG:By Mouth   [DISCONTINUED] clarithromycin (BIAXIN) 500 MG tablet Take  1 tablet  2 x / day with Food for Infection2   [DISCONTINUED] famciclovir (FAMVIR) 500 MG tablet Take 1 tablet (500 mg total) by mouth 3 (three) times daily. 7 days   [DISCONTINUED] fluticasone (FLONASE) 50 MCG/ACT nasal spray Place 2 sprays into both nostrils at bedtime.   [DISCONTINUED] meclizine (ANTIVERT) 25 MG tablet Take 1/2 to 1 tablet 2 to 3 x /day as needed for Dizziness / Vertigo   [DISCONTINUED] predniSONE (DELTASONE) 10 MG tablet 1 tab 3 x day for 2 days, then 1 tab 2 x day for 2 days, then 1 tab 1 x day for 3 days   [DISCONTINUED] sulfamethoxazole-trimethoprim (BACTRIM DS) 800-160 MG tablet Take   1 tablet  2 x / day with Food for Sinus  Infection   No facility-administered encounter medications on file as of 07/09/2023.    Past Medical History:  Diagnosis Date   COVID-19 (12/16/2020) 12/17/2020   GERD (gastroesophageal reflux disease)    Hypercholesterolemia    Hypertension    labile    Plantar fasciitis, right 12/07/2016   Vitamin D deficiency     Past Surgical History:  Procedure Laterality Date   COLONOSCOPY N/A 2002,2007,2012   ROTATOR CUFF REPAIR Right 2015    History reviewed. No pertinent family history.  Social History   Socioeconomic History   Marital status: Married    Spouse name: Not on file   Number of children: Not on file   Years of education: Not on file   Highest education level: Not on file  Occupational History   Not on file  Tobacco Use   Smoking status: Never   Smokeless tobacco: Never  Substance and Sexual Activity   Alcohol use: Yes    Comment: drinks 2-3 beers a week   Drug use: No   Sexual activity: Not on file  Other Topics Concern   Not on file  Social History Narrative   Not on file   Social Drivers of Health   Financial Resource Strain: High Risk (04/24/2021)   Received from [facility], [facility]   Overall Financial Resource Strain (CARDIA)    Difficulty of Paying Living Expenses: Hard  Food Insecurity: No Food Insecurity (04/24/2021)   Received from [facility], [facility]   Hunger Vital Sign    Worried About Running Out of Food in the Last Year: Never true    Ran Out of Food in the Last Year: Never true  Transportation Needs: No Transportation Needs (04/24/2021)   Received from [facility], [facility]   PRAPARE - Transportation    Lack of Transportation (Medical): No    Lack of Transportation (Non-Medical): No  Physical Activity: Sufficiently Active (04/24/2021)   Received from [facility], [facility]   Exercise Vital Sign    Days of Exercise per Week: 3 days    Minutes of Exercise per Session: 60 min  Stress: No Stress Concern Present  (04/24/2021)   Received from [facility], [facility] of Occupational Health - Occupational Stress Questionnaire    Feeling of Stress : Only a little  Social Connections: Socially Integrated (04/24/2021)   Received from [facility], [facility]   Social Connection and Isolation Panel [NHANES]    Frequency of Communication with Friends and Family: More than three times a week    Frequency of Social Gatherings with Friends and Family: More than three times a week    Attends Religious Services: More than 4 times per year    Active Member of [employer] or Organizations: Yes    Attends Engineer, structural: More than 4 times per year    Marital Status: Married  Catering manager Violence: Unknown (04/24/2021)   Received from [employer], [facility]   Humiliation, Afraid, Rape, and Kick questionnaire    Fear of Current or Ex-Partner: No    Emotionally Abused: No    Physically Abused: Not on file    Sexually Abused: No    ROS      Objective    BP (!) 140/75   Pulse (!) 54   Ht 5\' 7"  (1.702 m)   Wt 164 lb 3.2 oz (74.5 kg)   SpO2 98%   BMI 25.72 kg/m   Physical Exam Vitals and nursing note reviewed.  Constitutional:      Appearance: Normal appearance.  HENT:     Head: Normocephalic and atraumatic.  Eyes:     Conjunctiva/sclera: Conjunctivae normal.  Cardiovascular:     Rate and Rhythm: Normal rate and regular rhythm.  Pulmonary:     Effort: Pulmonary effort is normal.     Breath sounds: Normal breath sounds.  Skin:    General: Skin is warm and dry.  Neurological:     Mental Status: He is alert.  Psychiatric:        Mood and Affect: Mood normal.         Assessment & Plan:   Problem List Items Addressed This Visit       Cardiovascular and Mediastinum   Labile hypertension   BP high here.  Will monitor at home.       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          Endocrine   IFG (impaired fasting glucose)   Due to recheck A1C   Lab Results  Component Value Date   HGBA1C 5.6 07/09/2023         Relevant Orders   Hemoglobin A1c (Completed)  CMP14+EGFR (Completed)     Nervous and Auditory   Hearing loss - Primary   Wears hearing aids.         Other   Major depression in full remission (HCC)   Doing well on Effexor. Has tried several meds in the past  and this has worked the best for him. Happy with current regimen.       Hyperlipidemia, mixed   On Crestor and doing well. Exercises regularly.        Return in about 3 months (around 10/16/2023) for 3 mo check for BP .   Nani Gasser, MD

## 2023-07-10 ENCOUNTER — Encounter: Payer: Self-pay | Admitting: Family Medicine

## 2023-07-10 ENCOUNTER — Encounter: Payer: Medicare Other | Admitting: Nurse Practitioner

## 2023-07-10 LAB — CMP14+EGFR
ALT: 15 IU/L (ref 0–44)
AST: 19 IU/L (ref 0–40)
Albumin: 4.4 g/dL (ref 3.8–4.8)
Alkaline Phosphatase: 57 IU/L (ref 44–121)
BUN/Creatinine Ratio: 16 (ref 10–24)
BUN: 16 mg/dL (ref 8–27)
Bilirubin Total: 0.5 mg/dL (ref 0.0–1.2)
CO2: 26 mmol/L (ref 20–29)
Calcium: 9.7 mg/dL (ref 8.6–10.2)
Chloride: 106 mmol/L (ref 96–106)
Creatinine, Ser: 1 mg/dL (ref 0.76–1.27)
Globulin, Total: 2.1 g/dL (ref 1.5–4.5)
Glucose: 88 mg/dL (ref 70–99)
Potassium: 4.8 mmol/L (ref 3.5–5.2)
Sodium: 144 mmol/L (ref 134–144)
Total Protein: 6.5 g/dL (ref 6.0–8.5)
eGFR: 79 mL/min/{1.73_m2} (ref >59)

## 2023-07-10 LAB — HEMOGLOBIN A1C
Est. average glucose Bld gHb Est-mCnc: 114 mg/dL
Hgb A1c MFr Bld: 5.6 % (ref 4.8–5.6)

## 2023-07-10 NOTE — Progress Notes (Signed)
 Hi Francisco Dixon, great news!  Your A1c looks fantastic this time it was 6 back in September it is now 5.6 which is wonderful.  Your metabolic panel including liver and kidney function looks great.  It was a pleasure to meet you yesterday.

## 2023-07-11 NOTE — Assessment & Plan Note (Signed)
 BP high here.  Will monitor at home.

## 2023-07-11 NOTE — Assessment & Plan Note (Signed)
 On left thigh.

## 2023-07-11 NOTE — Assessment & Plan Note (Signed)
 On Crestor and doing well. Exercises regularly.

## 2023-07-13 ENCOUNTER — Ambulatory Visit (INDEPENDENT_AMBULATORY_CARE_PROVIDER_SITE_OTHER): Admitting: Sports Medicine

## 2023-07-13 ENCOUNTER — Encounter: Payer: Self-pay | Admitting: Sports Medicine

## 2023-07-13 DIAGNOSIS — M1712 Unilateral primary osteoarthritis, left knee: Secondary | ICD-10-CM | POA: Diagnosis not present

## 2023-07-13 NOTE — Assessment & Plan Note (Signed)
 This is a very pleasant 73 year old male, he has had months of pain left knee medial joint line, this worsened particularly when he was working on his knees doing some home-improvement. He saw his PCP, x-rays and MRI were done. MRI did ultimately show a subchondral insufficiency fracture, severe osteoarthritis and some meniscal tearing. Meloxicam seems to help. On exam he does have a mild effusion, tenderness medial joint line but otherwise good motion, good strength, he does have some pain with flexion.   I explained the anatomy and pathophysiology of knee osteoarthritis and degenerative meniscal tearing, we discussed the stepwise approach starting noninvasive. We will have him do some physical therapy, he can do meloxicam for 2 weeks, he will wear compression sleeve. He will avoid deep knee flexion and twisting. Return to see me in about 4 weeks to 6 weeks and if persistent discomfort we will consider injection.

## 2023-07-13 NOTE — Progress Notes (Signed)
    Procedures performed today:    None.  Independent interpretation of notes and tests performed by another provider:   None.  Brief History, Exam, Impression, and Recommendations:    Primary osteoarthritis of left knee This is a very pleasant 73 year old male, he has had months of pain left knee medial joint line, this worsened particularly when he was working on his knees doing some home-improvement. He saw his PCP, x-rays and MRI were done. MRI did ultimately show a subchondral insufficiency fracture, severe osteoarthritis and some meniscal tearing. Meloxicam seems to help. On exam he does have a mild effusion, tenderness medial joint line but otherwise good motion, good strength, he does have some pain with flexion.   I explained the anatomy and pathophysiology of knee osteoarthritis and degenerative meniscal tearing, we discussed the stepwise approach starting noninvasive. We will have him do some physical therapy, he can do meloxicam for 2 weeks, he will wear compression sleeve. He will avoid deep knee flexion and twisting. Return to see me in about 4 weeks to 6 weeks and if persistent discomfort we will consider injection.    ____________________________________________ Francisco Dixon. Benjamin Stain, M.D., ABFM., CAQSM., AME. Primary Care and Sports Medicine [facility]  Adjunct Professor of Family Medicine  [employer]  Restaurant manager, fast food

## 2023-07-18 ENCOUNTER — Encounter: Payer: Self-pay | Admitting: Family Medicine

## 2023-07-18 NOTE — Telephone Encounter (Signed)
 Okay to refer to an orthopedist for further workup and for second opinion.  Commend Dr. Lequita Halt in [place]

## 2023-07-19 ENCOUNTER — Ambulatory Visit

## 2023-07-19 ENCOUNTER — Other Ambulatory Visit: Payer: Self-pay

## 2023-07-19 DIAGNOSIS — M1712 Unilateral primary osteoarthritis, left knee: Secondary | ICD-10-CM

## 2023-08-01 ENCOUNTER — Ambulatory Visit (INDEPENDENT_AMBULATORY_CARE_PROVIDER_SITE_OTHER): Admitting: Sports Medicine

## 2023-08-01 ENCOUNTER — Encounter: Payer: Self-pay | Admitting: Sports Medicine

## 2023-08-01 ENCOUNTER — Other Ambulatory Visit (INDEPENDENT_AMBULATORY_CARE_PROVIDER_SITE_OTHER)

## 2023-08-01 DIAGNOSIS — M1712 Unilateral primary osteoarthritis, left knee: Secondary | ICD-10-CM | POA: Diagnosis not present

## 2023-08-01 MED ORDER — TRIAMCINOLONE ACETONIDE 40 MG/ML IJ SUSP
40.0000 mg | Freq: Once | INTRAMUSCULAR | Status: AC
Start: 2023-08-01 — End: 2023-08-01
  Administered 2023-08-01: 40 mg via INTRAMUSCULAR

## 2023-08-01 NOTE — Progress Notes (Signed)
    Procedures performed today:    Procedure: Real-time Ultrasound Guided injection of the left knee Device: Samsung HS60  Verbal informed consent obtained.  Time-out conducted.  Noted no overlying erythema, induration, or other signs of local infection.  Skin prepped in a sterile fashion.  Local anesthesia: Topical Ethyl chloride.  With sterile technique and under real time ultrasound guidance: Trace effusion noted 1 cc Kenalog 40, 2 cc lidocaine, 2 cc bupivacaine injected easily Completed without difficulty  Advised to call if fevers/chills, erythema, induration, drainage, or persistent bleeding.  Images permanently stored and available for review in PACS.  Impression: Technically successful ultrasound guided injection.  Independent interpretation of notes and tests performed by another provider:   None.  Brief History, Exam, Impression, and Recommendations:    Primary osteoarthritis of left knee This exquisitely pleasant 73 year old male returns, he has had months of left medial knee pain, this worsened acutely when working on his knees doing some home-improvement, he saw his PCP, x-rays and MRI were done that showed a medial subchondral insufficiency injury, medial meniscal tearing, significant osteoarthritis worst patellofemoral and medial tibiofemoral. He did not respond to meloxicam, home conditioning, compression bracing, activity modification. Today we injected his knee, he can return to see me in about 6 weeks. If insufficient improvement we will consider viscosupplementation.    ____________________________________________ Joselyn Nicely. Sandy Crumb, M.D., ABFM., CAQSM., AME. Primary Care and Sports Medicine [place] [facility]  Adjunct Professor of [facility] Medicine  [employer]  Restaurant manager, fast food

## 2023-08-01 NOTE — Addendum Note (Signed)
 Addended by: OLIVA-AVELLANEDA, Sarrah Fiorenza L on: 08/01/2023 09:19 AM   Modules accepted: Orders

## 2023-08-01 NOTE — Assessment & Plan Note (Signed)
 This exquisitely pleasant 73 year old male returns, he has had months of left medial knee pain, this worsened acutely when working on his knees doing some home-improvement, he saw his PCP, x-rays and MRI were done that showed a medial subchondral insufficiency injury, medial meniscal tearing, significant osteoarthritis worst patellofemoral and medial tibiofemoral. He did not respond to meloxicam, home conditioning, compression bracing, activity modification. Today we injected his knee, he can return to see me in about 6 weeks. If insufficient improvement we will consider viscosupplementation.

## 2023-08-07 ENCOUNTER — Encounter: Payer: Self-pay | Admitting: Family Medicine

## 2023-08-07 ENCOUNTER — Other Ambulatory Visit: Payer: Self-pay

## 2023-08-07 DIAGNOSIS — F332 Major depressive disorder, recurrent severe without psychotic features: Secondary | ICD-10-CM

## 2023-08-07 MED ORDER — VENLAFAXINE HCL ER 75 MG PO CP24
ORAL_CAPSULE | ORAL | 3 refills | Status: AC
Start: 2023-08-07 — End: ?

## 2023-08-10 ENCOUNTER — Ambulatory Visit: Admitting: Sports Medicine

## 2023-09-12 ENCOUNTER — Telehealth: Payer: Self-pay | Admitting: Sports Medicine

## 2023-09-12 ENCOUNTER — Ambulatory Visit (INDEPENDENT_AMBULATORY_CARE_PROVIDER_SITE_OTHER): Admitting: Sports Medicine

## 2023-09-12 DIAGNOSIS — M1712 Unilateral primary osteoarthritis, left knee: Secondary | ICD-10-CM | POA: Diagnosis not present

## 2023-09-12 NOTE — Assessment & Plan Note (Signed)
 Bhavesh returns, he has had months of left medial knee pain, acutely worsened when working on his knees doing some home-improvement projects, MRI ultimately showed medial subchondral insufficiency injury, medial meniscal tearing, as well as significant osteoarthritis worst patellofemoral and medial tibiofemoral. Meloxicam , compression bracing activity modification and home conditioning were ineffective, we injected his knee at the last visit he has done really well, he did have some recurrence of pain but nowhere near how it felt before the injection. We have suggested some additional options including watchful waiting, viscosupplementation, we discussed geniculate artery embolization. He will get more consistent with home conditioning, add arthritis strength Tylenol, we will get him approved for viscosupplementation, he will wear his knee brace more consistently, and he would like a consultation with orthopedic surgery though I do not think he is a candidate yet for arthroplasty.

## 2023-09-12 NOTE — Telephone Encounter (Signed)
 Visco approval please, bilateral x-ray confirmed osteoarthritis

## 2023-09-12 NOTE — Progress Notes (Signed)
    Procedures performed today:    None.  Independent interpretation of notes and tests performed by another provider:   None.  Brief History, Exam, Impression, and Recommendations:    Primary osteoarthritis of left knee Zyrus returns, he has had months of left medial knee pain, acutely worsened when working on his knees doing some home-improvement projects, MRI ultimately showed medial subchondral insufficiency injury, medial meniscal tearing, as well as significant osteoarthritis worst patellofemoral and medial tibiofemoral. Meloxicam , compression bracing activity modification and home conditioning were ineffective, we injected his knee at the last visit he has done really well, he did have some recurrence of pain but nowhere near how it felt before the injection. We have suggested some additional options including watchful waiting, viscosupplementation, we discussed geniculate artery embolization. He will get more consistent with home conditioning, add arthritis strength Tylenol, we will get him approved for viscosupplementation, he will wear his knee brace more consistently, and he would like a consultation with orthopedic surgery though I do not think he is a candidate yet for arthroplasty.  I spent 30 minutes of total time managing this patient today, this includes chart review, face to face, and non-face to face time.  ____________________________________________ Joselyn Nicely. Sandy Crumb, M.D., ABFM., CAQSM., AME. Primary Care and Sports Medicine [facility]  Adjunct Professor of [facility]  [employer]  Restaurant manager, fast food

## 2023-09-12 NOTE — Telephone Encounter (Addendum)
 Benefits Investigation Details received from [facility]: Orthovisc PA required: No May fill through: Buy and Bill  OV Copay/Coinsurance: 20% Product Copay: 20% Administration Coinsurance: 20% Administration Copay: $ Deductible: $257 (Met: $257) Individual  Benefits Investigation Started 09/12/2023  Deductible does apply. Since deductible has been met, patient is responsible for a coinsurance.  Prior Authorization is not required.  Patient CoPay comes to a total of $194 per office visit.  Patient has been advised. Patient stated he will be waiting until his follow-up appointment 11/12/2023 to begin [facility] Injections.

## 2023-09-18 ENCOUNTER — Encounter: Payer: Self-pay | Admitting: Sports Medicine

## 2023-09-20 ENCOUNTER — Other Ambulatory Visit: Payer: Self-pay | Admitting: *Deleted

## 2023-09-20 ENCOUNTER — Encounter: Payer: Self-pay | Admitting: Family Medicine

## 2023-09-20 DIAGNOSIS — E782 Mixed hyperlipidemia: Secondary | ICD-10-CM

## 2023-09-20 MED ORDER — ROSUVASTATIN CALCIUM 20 MG PO TABS
ORAL_TABLET | ORAL | 3 refills | Status: AC
Start: 2023-09-20 — End: ?

## 2023-09-22 ENCOUNTER — Encounter: Payer: Self-pay | Admitting: Sports Medicine

## 2023-09-23 ENCOUNTER — Ambulatory Visit
Admission: EM | Admit: 2023-09-23 | Discharge: 2023-09-23 | Disposition: A | Discharge status: Home / Self Care | Attending: Family Medicine | Admitting: Family Medicine

## 2023-09-23 ENCOUNTER — Other Ambulatory Visit: Payer: Self-pay

## 2023-09-23 DIAGNOSIS — Z8616 Personal history of COVID-19: Secondary | ICD-10-CM | POA: Insufficient documentation

## 2023-09-23 DIAGNOSIS — R197 Diarrhea, unspecified: Secondary | ICD-10-CM | POA: Diagnosis not present

## 2023-09-23 DIAGNOSIS — E860 Dehydration: Secondary | ICD-10-CM | POA: Diagnosis not present

## 2023-09-23 DIAGNOSIS — R634 Abnormal weight loss: Secondary | ICD-10-CM | POA: Insufficient documentation

## 2023-09-23 MED ORDER — DIPHENOXYLATE-ATROPINE 2.5-0.025 MG PO TABS: 1.0000 | ORAL_TABLET | Freq: Four times a day (QID) | ORAL | 0 refills | Status: DC | PRN

## 2023-09-23 MED ORDER — AZITHROMYCIN 500 MG PO TABS: 500.0000 mg | ORAL_TABLET | Freq: Every day | ORAL | 0 refills | Status: DC

## 2023-09-23 NOTE — ED Provider Notes (Signed)
 [facility]    CSN: [id number] Arrival date & time: 09/23/23  1353      History   Chief Complaint Chief Complaint  Patient presents with   Diarrhea    HPI Francisco Dixon is a 73 y.o. male.   Patient is here with his wife.  They both have the same history.  They had [place] food for dinner 1 night, and the leftovers the following day.  The morning after the leftovers they both developed diarrhea.  They have both had watery diarrhea for the last 9 days.  Patient states that he has lost a pound a day and has lost 10 pounds total.  He is concerned that the skin on his abdomen and upper thighs looks mottled.  He feels dehydrated.  He cannot keep up with his fluids.  Anytime he eats or drinks anything he has watery bowels No recent travel.  No other food that may have been bad.  No camping.  No exposure to illness.  He states that they have a new puppy, there is feral cats outside, but no other animal exposure.    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 Authorizing Provider  azithromycin  (ZITHROMAX ) 500 MG tablet Take 1 tablet (500 mg total) by mouth daily. 09/23/23  Yes Stephany Ehrich, MD  diphenoxylate-atropine (LOMOTIL) 2.5-0.025 MG tablet Take 1 tablet by mouth 4 (four) times daily as needed for diarrhea or loose stools. 09/23/23  Yes Stephany Ehrich, MD  aspirin  EC 81 MG tablet Take  1 tablet  Daily 06/19/21   Vangie Genet, MD  Cholecalciferol (VITAMIN D3) 125 MCG (5000 UT) CAPS Take 2 capsules Daily 06/19/21   Vangie Genet, MD  Flaxseed, Linseed, (FLAXSEED OIL) 1000 MG CAPS Take 2 capsules by mouth daily.    [provider]  meloxicam  (MOBIC ) 15 MG tablet Take ONE-HALF TO ONE tablet BY MOUTH Daily with food FOR Pain & Inflammation - try TO limit TO FIVE DAYS /week TO AVOID kidney damage] 04/16/23   Wilkinson, Dana E, FNP  Omega-3 Fatty Acids (FISH OIL ) 1000 MG CAPS Takes 1 capsule daily 04/02/17   Vangie Genet, MD  rosuvastatin  (CRESTOR ) 20 MG tablet Take 1 tablet  every day  for Cholesterol 09/20/23   Cydney Draft, MD  venlafaxine  XR (EFFEXOR -XR) 75 MG 24 hr capsule TAKE ONE CAPSULE BY MOUTH DAILY FOR mood 08/07/23   Cydney Draft, MD    Family History History reviewed. No pertinent family history.  Social History Social History   Tobacco Use   Smoking status: Never   Smokeless tobacco: Never  Substance Use Topics   Alcohol use: Yes    Comment: drinks 2-3 beers a week   Drug use: No     Allergies   Sulfa  antibiotics and Other   Review of Systems Review of Systems See HPI  Physical Exam Triage Vital Signs ED Triage Vitals  Encounter Vitals Group     BP 09/23/23 1401 124/72     Systolic BP Percentile --      Diastolic BP Percentile --      Pulse Rate 09/23/23 1401 97     Resp 09/23/23 1401 16     Temp 09/23/23 1401 99.6 F (37.6 C)     Temp src --      SpO2 09/23/23 1401 95 %     Weight --      Height --      Head Circumference --      Peak Flow --       Pain Score 09/23/23 1402 2     Pain Loc --      Pain Education --      Exclude from Growth Chart --    No data found.  Updated Vital Signs BP 124/72   Pulse 97   Temp 99.6 F (37.6 C)   Resp 16   SpO2 95%      Physical Exam Constitutional:      General: He is not in acute distress.    Appearance: He is well-developed.  HENT:     Head: Normocephalic and atraumatic.     Mouth/Throat:     Mouth: Mucous membranes are dry.  Eyes:     Conjunctiva/sclera: Conjunctivae normal.     Pupils: Pupils are equal, round, and reactive to light.  Cardiovascular:     Rate and Rhythm: Normal rate and regular rhythm.  Pulmonary:     Effort: Pulmonary effort is normal. No respiratory distress.     Breath sounds: Normal breath sounds.  Abdominal:     General: There is no distension.     Palpations: Abdomen is soft.     Comments: Abdomen is soft.  Nontender.  Hyperactive bowel sounds.  No organomegaly  Musculoskeletal:        General: Normal range of motion.     Cervical back: Normal range of motion.  Skin:    General: Skin is warm and dry.  Neurological:     Mental Status: He is alert.      UC Treatments / Results  Labs (all labs ordered are listed, but only abnormal results are displayed) Labs Reviewed  GASTROINTESTINAL PANEL BY PCR, STOOL (REPLACES STOOL CULTURE)    EKG   Radiology No results found.  Procedures Procedures (including critical care time)  Medications Ordered in UC Medications - No data to display  Initial Impression / Assessment and Plan / UC Course  I have reviewed the triage vital signs and the nursing notes.  Pertinent labs & imaging results that were available during my care of the patient were reviewed by me and considered in my medical decision making (see chart for details).     Concern for weight loss send dehydration with diarrhea for 9 days.  Will do a stool pathogen panel.  Izola Mart to cover him with antibiotics.  Wife also.  And giving him  a stronger diarrhea medication to use judiciously.  Increase fluids.  See PCP if fails to  improve. Final Clinical Impressions(s) / UC Diagnoses   Final diagnoses:  Acute diarrhea  Dehydration   Discharge Instructions      The stool results will be available on MyChart You need to increase your fluid intake.  A good replacement fluid would be an electrolyte drink like Gatorade or Pedialyte Bland foods only.  Nothing fried fatty or spicy Take the antibiotic once a day for 3 days I have prescribed Lomotil to slow down the diarrhea.  Take 1 as needed, repeat up to 3 or 4 times a day if needed Take Lomotil carefully.  You do not want to completely stop the bowel movements A nurse will call you if the stool culture is positive  ED Prescriptions     Medication Sig Dispense Auth. Provider   diphenoxylate-atropine (LOMOTIL) 2.5-0.025 MG tablet Take 1 tablet by mouth 4 (four) times daily as needed for diarrhea or loose stools. 20 tablet Stephany Ehrich, MD   azithromycin  (ZITHROMAX ) 500 MG tablet Take 1 tablet (500 mg total) by mouth daily. 3 tablet Stephany Ehrich, MD      PDMP not reviewed this encounter.   Stephany Ehrich, MD 09/23/23 [phone]

## 2023-09-23 NOTE — Discharge Instructions (Signed)
 The stool results will be available on MyChart You need to increase your fluid intake.  A good replacement fluid would be an electrolyte drink like Gatorade or Pedialyte Bland foods only.  Nothing fried fatty or spicy Take the antibiotic once a day for 3 days I have prescribed Lomotil to slow down the diarrhea.  Take 1 as needed, repeat up to 3 or 4 times a day if needed Take Lomotil carefully.  You do not want to completely stop the bowel movements A nurse will call you if the stool culture is positive

## 2023-09-23 NOTE — ED Triage Notes (Signed)
 Diarrhea, cough, ha, night sweats x 9 days. Unsure of fever. Has had immodium.

## 2023-09-24 ENCOUNTER — Ambulatory Visit: Admitting: Sports Medicine

## 2023-09-24 LAB — GASTROINTESTINAL PANEL BY PCR, STOOL (REPLACES STOOL CULTURE)

## 2023-09-25 DIAGNOSIS — M25562 Pain in left knee: Secondary | ICD-10-CM | POA: Diagnosis not present

## 2023-09-27 NOTE — Progress Notes (Signed)
 Patient had diarrhea for 9 days associated with weight loss.  Current medical recommendations include stool testing for diarrhea lasting more than 7 days.

## 2023-10-01 ENCOUNTER — Encounter: Payer: Self-pay | Admitting: Family Medicine

## 2023-10-01 ENCOUNTER — Ambulatory Visit: Payer: Medicare Other | Admitting: Nurse Practitioner

## 2023-10-03 NOTE — Telephone Encounter (Signed)
 Lets just make sure that we have the form from Dr. Murrell Arrant office.  Also please update his appointment notes just to remind me.

## 2023-10-08 ENCOUNTER — Ambulatory Visit: Payer: Medicare Other | Admitting: Nurse Practitioner

## 2023-10-16 ENCOUNTER — Ambulatory Visit (INDEPENDENT_AMBULATORY_CARE_PROVIDER_SITE_OTHER): Admitting: Family Medicine

## 2023-10-16 VITALS — BP 145/83 | HR 62 | Ht 67.0 in | Wt 160.1 lb

## 2023-10-16 DIAGNOSIS — Z01818 Encounter for other preprocedural examination: Secondary | ICD-10-CM

## 2023-10-16 NOTE — Progress Notes (Signed)
   Established Patient Office Visit  Subjective  Patient ID: Francisco Dixon, male    DOB: October 04, 1950  Age: 73 y.o. MRN: [id number]  No chief complaint on file.   HPI Pre op [facility] for left knee surgery with hx of HTN, prediabetes, and depression.   Tdap UTD.   We had not received paperwork from his orthopedic office so we called them.  He had not got the official recommendation from the surgeon yet he has an appointment on July 15.  So we discussed going to that appointment so that they can make a recommendation for type of anesthesia and whether or not he is gena go to day surgery center etc. and then send us  the paperwork we will go ahead and schedule him for the following week for his preop and we can get everything updated at that time.  We did not charge him for the visit today.     ROS    Objective:     There were no vitals taken for this visit.     No results found for any visits on 10/16/23.    The 10-year ASCVD risk score (Arnett DK, et al., 2019) is: 18.6%    Assessment & Plan:   Problem List Items Addressed This Visit   None Visit Diagnoses       Preop general physical exam    -  Primary       No follow-ups on file.    Dorothyann Byars, MD

## 2023-10-17 ENCOUNTER — Telehealth: Payer: Self-pay | Admitting: *Deleted

## 2023-10-17 NOTE — Telephone Encounter (Signed)
 This has been addressed.

## 2023-10-17 NOTE — Telephone Encounter (Signed)
 Pt has to be seen by Dr. Yvone before he can get any pre op clearance done by Dr. Alvan. He was advised of this and has an appointment on 7/15 with Dr. Yvone.

## 2023-10-30 DIAGNOSIS — M25562 Pain in left knee: Secondary | ICD-10-CM | POA: Diagnosis not present

## 2023-10-30 DIAGNOSIS — M1712 Unilateral primary osteoarthritis, left knee: Secondary | ICD-10-CM | POA: Diagnosis not present

## 2023-11-05 ENCOUNTER — Ambulatory Visit (INDEPENDENT_AMBULATORY_CARE_PROVIDER_SITE_OTHER): Admitting: Family Medicine

## 2023-11-05 ENCOUNTER — Encounter: Payer: Self-pay | Admitting: Family Medicine

## 2023-11-05 VITALS — BP 137/78 | HR 69 | Ht 67.0 in | Wt 163.0 lb

## 2023-11-05 DIAGNOSIS — R0989 Other specified symptoms and signs involving the circulatory and respiratory systems: Secondary | ICD-10-CM

## 2023-11-05 DIAGNOSIS — Z01818 Encounter for other preprocedural examination: Secondary | ICD-10-CM | POA: Diagnosis not present

## 2023-11-05 DIAGNOSIS — H2513 Age-related nuclear cataract, bilateral: Secondary | ICD-10-CM | POA: Diagnosis not present

## 2023-11-05 NOTE — Progress Notes (Signed)
 Established Patient Office Visit  Subjective  Patient ID: Francisco Dixon, male    DOB: August 01, 1950  Age: 73 y.o. MRN: [id number]  Chief Complaint  Patient presents with   Pre-op Exam    HPI  Francisco Dixon is here today for preoperative assessment for left total knee arthroplasty at [facility] orthopedics with Dr. Norleen Gavel..  It is he is get any clearance as well as office notes and any labs within the last 90 days they would like for us  to go ahead and draw a CMP, CBC with differential.  He does not use tobacco. He does have a history of hypertension, prediabetes and hyperlipidemia. He does wear hearing aids and uses a cane because of his knee.  He does not have any significant cardiac or pulmonary issues.  No recent chest pain or shortness of breath.  No prior complications with surgery.  No difficulty swallowing or current dental issues.  No bleeding concerns.  He does take aspirin  and fish oil  daily.  Allergies  Allergen Reactions   Sulfa  Antibiotics Rash   Other     Bee stings   Outpatient Encounter Medications as of 11/05/2023  Medication Sig   aspirin  EC 81 MG tablet Take  1 tablet  Daily   Cholecalciferol (VITAMIN D3) 125 MCG (5000 UT) CAPS Take 2 capsules Daily   Flaxseed, Linseed, (FLAXSEED OIL) 1000 MG CAPS Take 2 capsules by mouth daily.   Omega-3 Fatty Acids (FISH OIL ) 1000 MG CAPS Takes 1 capsule daily   rosuvastatin  (CRESTOR ) 20 MG tablet Take 1 tablet  every day  for Cholesterol   venlafaxine  XR (EFFEXOR -XR) 75 MG 24 hr capsule TAKE ONE CAPSULE BY MOUTH DAILY FOR mood   No facility-administered encounter medications on file as of 11/05/2023.      Past Surgical History:  Procedure Laterality Date   COLONOSCOPY N/A 2002,2007,2012   ROTATOR CUFF REPAIR Right 2015       ROS    Objective:     BP 137/78   Pulse 69   Ht 5' 7 (1.702 m)   Wt 163 lb 0.6 oz (74 kg)   SpO2 96%   BMI 25.54 kg/m    Physical Exam Constitutional:      Appearance: Normal  appearance.  HENT:     Head: Normocephalic and atraumatic.     Right Ear: Tympanic membrane, ear canal and external ear normal.     Left Ear: Tympanic membrane, ear canal and external ear normal.     Nose: Nose normal.     Mouth/Throat:     Pharynx: Oropharynx is clear.  Eyes:     Extraocular Movements: Extraocular movements intact.     Conjunctiva/sclera: Conjunctivae normal.     Pupils: Pupils are equal, round, and reactive to light.  Neck:     Thyroid : No thyromegaly.  Cardiovascular:     Rate and Rhythm: Normal rate and regular rhythm.  Pulmonary:     Effort: Pulmonary effort is normal.     Breath sounds: Normal breath sounds.  Abdominal:     General: Bowel sounds are normal.     Palpations: Abdomen is soft.     Tenderness: There is no abdominal tenderness.  Musculoskeletal:        General: No swelling.     Cervical back: Neck supple.  Skin:    General: Skin is warm and dry.  Neurological:     Mental Status: He is oriented to person, place, and time.  Psychiatric:  Mood and Affect: Mood normal.        Behavior: Behavior normal.      No results found for any visits on 11/05/23.    The 10-year ASCVD risk score (Arnett DK, et al., 2019) is: 21.7%    Assessment & Plan:   Problem List Items Addressed This Visit       Cardiovascular and Mediastinum   Labile hypertension   Relevant Orders   CBC with Differential/Platelet   CMP14+EGFR   Other Visit Diagnoses       Preop general physical exam    -  Primary   Relevant Orders   CBC with Differential/Platelet   CMP14+EGFR      Mr. Francisco Dixon is cleared today for left knee replacement surgery.  EKG normal.  Will get updated labs.  He is overall low risk.  He will need to stop his aspirin  and fish oil  1 week before surgery.  Avoid all NSAIDs 1 week prior to surgery.  EKG shows rate of 65 bpm, normal sinus rhythm, no acute ST-T wave changes.  No follow-ups on file.    Dorothyann Byars, MD

## 2023-11-05 NOTE — Patient Instructions (Signed)
 Please stop your fish oil  and aspirin  1 week prior to surgery.  Also avoid any Aleve or ibuprofen type products for 1 week prior to surgery.  Okay to use Tylenol if needed for pain or fever relief.

## 2023-11-06 ENCOUNTER — Ambulatory Visit: Payer: Self-pay | Admitting: Family Medicine

## 2023-11-06 LAB — CMP14+EGFR
ALT: 19 IU/L (ref 0–44)
AST: 21 IU/L (ref 0–40)
Albumin: 4.4 g/dL (ref 3.8–4.8)
Alkaline Phosphatase: 59 IU/L (ref 44–121)
BUN/Creatinine Ratio: 21 (ref 10–24)
BUN: 21 mg/dL (ref 8–27)
Bilirubin Total: 0.4 mg/dL (ref 0.0–1.2)
CO2: 21 mmol/L (ref 20–29)
Calcium: 9.6 mg/dL (ref 8.6–10.2)
Chloride: 103 mmol/L (ref 96–106)
Creatinine, Ser: 1.02 mg/dL (ref 0.76–1.27)
Globulin, Total: 2.4 g/dL (ref 1.5–4.5)
Glucose: 87 mg/dL (ref 70–99)
Potassium: 4.4 mmol/L (ref 3.5–5.2)
Sodium: 140 mmol/L (ref 134–144)
Total Protein: 6.8 g/dL (ref 6.0–8.5)
eGFR: 78 mL/min/1.73 (ref >59)

## 2023-11-06 LAB — CBC WITH DIFFERENTIAL/PLATELET
Basophils Absolute: 0.1 x10E3/uL (ref 0.0–0.2)
Basos: 1 %
EOS (ABSOLUTE): 0.2 x10E3/uL (ref 0.0–0.4)
Eos: 3 %
Hematocrit: 43 % (ref 37.5–51.0)
Hemoglobin: 13.7 g/dL (ref 13.0–17.7)
Immature Grans (Abs): 0 x10E3/uL (ref 0.0–0.1)
Immature Granulocytes: 0 %
Lymphocytes Absolute: 1.5 x10E3/uL (ref 0.7–3.1)
Lymphs: 31 %
MCH: 29.1 pg (ref 26.6–33.0)
MCHC: 31.9 g/dL (ref 31.5–35.7)
MCV: 92 fL (ref 79–97)
Monocytes Absolute: 0.5 x10E3/uL (ref 0.1–0.9)
Monocytes: 9 %
Neutrophils Absolute: 2.8 x10E3/uL (ref 1.4–7.0)
Neutrophils: 56 %
Platelets: 270 x10E3/uL (ref 150–450)
RBC: 4.7 x10E6/uL (ref 4.14–5.80)
RDW: 14 % (ref 11.6–15.4)
WBC: 5 x10E3/uL (ref 3.4–10.8)

## 2023-11-06 NOTE — Progress Notes (Signed)
 Hi Win, labs are good to go so we should be able to get everything faxed over to the Ortho office today.

## 2023-11-12 ENCOUNTER — Ambulatory Visit: Admitting: Sports Medicine

## 2023-11-13 DIAGNOSIS — M1712 Unilateral primary osteoarthritis, left knee: Secondary | ICD-10-CM | POA: Diagnosis not present

## 2023-11-23 NOTE — Addendum Note (Signed)
 Addended by: BONNY JON DEL on: 11/23/2023 09:25 AM   Modules accepted: Orders

## 2023-12-03 DIAGNOSIS — M1712 Unilateral primary osteoarthritis, left knee: Secondary | ICD-10-CM | POA: Diagnosis not present

## 2023-12-03 DIAGNOSIS — R262 Difficulty in walking, not elsewhere classified: Secondary | ICD-10-CM | POA: Diagnosis not present

## 2023-12-05 DIAGNOSIS — M1712 Unilateral primary osteoarthritis, left knee: Secondary | ICD-10-CM | POA: Diagnosis not present

## 2023-12-05 DIAGNOSIS — Z96652 Presence of left artificial knee joint: Secondary | ICD-10-CM | POA: Diagnosis not present

## 2023-12-05 DIAGNOSIS — G8918 Other acute postprocedural pain: Secondary | ICD-10-CM | POA: Diagnosis not present

## 2023-12-05 DIAGNOSIS — M25762 Osteophyte, left knee: Secondary | ICD-10-CM | POA: Diagnosis not present

## 2023-12-06 NOTE — Therapy (Signed)
 OUTPATIENT PHYSICAL THERAPY LOWER EXTREMITY EVALUATION   Patient Name: BAYDEN GIL MRN: [id number] DOB:1950/08/15, 73 y.o., male Today's Date: 12/07/2023  END OF SESSION:  PT End of Session - 12/07/23 1100     Visit Number 1    Number of Visits 17    Date for PT Re-Evaluation 02/01/24    Authorization Type medicare    Progress Note Due on Visit 10    PT Start Time 1101    PT Stop Time 1149    PT Time Calculation (min) 48 min          Past Medical History:  Diagnosis Date   COVID-19 (12/16/2020) 12/17/2020   GERD (gastroesophageal reflux disease)    Hypercholesterolemia    Hypertension    labile    Plantar fasciitis, right 12/07/2016   Vitamin D  deficiency    Past Surgical History:  Procedure Laterality Date   COLONOSCOPY N/A 2002,2007,2012   ROTATOR CUFF REPAIR Right 2015   Patient Active Problem List   Diagnosis Date Noted   Primary osteoarthritis of left knee 07/13/2023   Hearing loss 07/09/2023   Hemangioma flammeus 07/09/2023   IFG (impaired fasting glucose) 07/09/2023   B12 deficiency 11/12/2018   Numbness and tingling of both feet 11/27/2017   Major depression in full remission (HCC) 07/09/2017   Medication management 07/07/2017   Abnormal glucose 07/07/2017   Allergy 07/07/2017   Labile hypertension 02/26/2016   Hyperlipidemia, mixed 02/26/2016   Vitamin D  deficiency 02/26/2016    PCP: Alvan Dorothyann BIRCH, MD  REFERRING PROVIDER: Yvone Rush, MD  REFERRING DIAG: [phone] (ICD-10-CM) - History of total left knee replacement (TKR)  THERAPY DIAG:  Left knee pain, unspecified chronicity  Stiffness of left knee, not elsewhere classified  Other abnormalities of gait and mobility  Localized edema  Rationale for Evaluation and Treatment: Rehabilitation  ONSET DATE: s/p L TKA DOS 8/20   SUBJECTIVE:   SUBJECTIVE STATEMENT: Spouse currently assisting heavily with self care, housework. Has been working on exercises since surgery, feels pain  but has been very adherent with them.  Prior to surgery pt was limited somewhat due to pain, but states he was doing yardwork, modified exercise classes 3x/week (stretching, aerobics, resistance training). Enjoys golfing, hiking, biking but was limited prior to surgery and using cane for a few weeks due to pain.  Is traveling to see daughter in beginning of October and wants to be active for that.   PERTINENT HISTORY: GERD, HTN PAIN:  Are you having pain: 3/10  Location/description: L knee, anterior and posterior Best-worst: 3-8/10; will tend to settle quickly   - aggravating factors: transfers, walking, stairs, housework, stretching - Easing factors: icing, movement    PRECAUTIONS: None  RED FLAGS: None Reports some nausea/dizziness w/ medications, improving No calf pain, fevers/chills, SOB Reports good compliance w/ post op aspirin   WEIGHT BEARING RESTRICTIONS: No  FALLS:  Has patient fallen in last 6 months? Yes. Number of falls 1 fall, walking puppy, denies injury, denies issues with balance  LIVING ENVIRONMENT: 1 story; entry through basement , 13 steps 1 rail to main level; front entrance 3+4 STE 1 rail with sloped entry Lives w/ wife who is also retired  OCCUPATION: retired - was a Building services engineer for 40 years in [place]    PLOF: Independent  PATIENT GOALS: take trip to see daughter, wants to get back to normal and be more active again (hiking, golfing, biking)   NEXT MD VISIT: around 8/28  OBJECTIVE:  Note: Objective  measures were completed at Evaluation unless otherwise noted.  DIAGNOSTIC FINDINGS:  S/p L TKA DOS 8/20   PATIENT SURVEYS:  LEFS: 6/80  COGNITION: Overall cognitive status: Within functional limits for tasks assessed     EDEMA/INSPECTION:  Incision obscured by dressing and compression stocking, no calf pain or swelling, no TTP about calf, no pain w/ DF; no visible drainage   LOWER EXTREMITY ROM:      Right eval Left eval  Hip flexion     Hip extension    Hip internal rotation    Hip external rotation    Knee extension  A: lacking 8 deg w/ towel under calf   Knee flexion  P: 70-72 deg  (Blank rows = not tested) (Key: WFL = within functional limits not formally assessed, * = concordant pain, s = stiffness/stretching sensation, NT = not tested)  Comments:    LOWER EXTREMITY MMT:    MMT Right eval Left eval  Hip flexion    Hip abduction (modified sitting)    Hip internal rotation    Hip external rotation    Knee flexion    Knee extension    Ankle dorsiflexion     (Blank rows = not tested) (Key: WFL = within functional limits not formally assessed, * = concordant pain, s = stiffness/stretching sensation, NT = not tested)  Comments: deferred on eval given acuity of surgery   FUNCTIONAL TESTS:  TUG: 25.13 w RW; weight shift to RLE during transfer, BIL UE from RW  GAIT: Distance walked: within clinic Assistive device utilized: Environmental consultant - 2 wheeled Level of assistance: Modified independence Comments: step to pattern leading w/ LLE, discontinuous RW management, reduced knee ROM throughout all phases                                                                                                                                TREATMENT DATE:  [facility] Adult PT Treatment:                                                DATE: 12/07/23 Therapeutic Exercise: Seated quad sets, heel slides, heel/toe raises practice reps + HEP handout, education on modification PRN, verbally reviewed prior HEP from acute care  Self Care: Education/discussion re: post op self care (red flag monitoring and appropriate action should they occur); encouraged communication w/ provider re: dressing management; appropriate icing regimen; frequent/short bouts of walking throughout day to encourage mobility; usual course of rehab post op TKA; exam findings as they relate to functional mobility   PATIENT EDUCATION:  Education details: Pt education on PT  impairments, prognosis, and POC. Informed consent. Rationale for interventions, safe/appropriate HEP performance, self care as above Person educated: Patient Education method: Explanation, Demonstration, Tactile cues, Verbal cues Education comprehension: verbalized understanding, returned demonstration, verbal cues  required, tactile cues required, and needs further education    HOME EXERCISE PROGRAM: Access Code: [email or web address] URL: [email or web address] Date: 12/07/2023 Prepared by: Alm Jenny  Exercises - Seated Quad Set  - 2-3 x daily - 1 sets - 10-15 reps - Seated Heel Slide  - 2-3 x daily - 1 sets - 10-15 reps - Seated Heel Toe Raises  - 2-3 x daily - 1 sets - 10-15 reps  ASSESSMENT:  CLINICAL IMPRESSION: Patient is a pleasant 73 y.o. gentleman who was seen today for physical therapy evaluation and treatment for L TKA DOS 8/20. Pt endorses difficulty w/ ADLs and functional mobility as expected for post surgical status. No red flags today. On exam he demonstrates concordant deficits in L knee ROM, altered gait/transfer mechanics, post op edema. TUG is indicative of reduced mobility and fall risk. Tolerates exam/HEP well overall, no adverse events, self care education as above. Recommend skilled PT to address aforementioned deficits with aim of improving functional tolerance and reducing pain with typical activities. Pt departs today's session in no acute distress, all voiced concerns/questions addressed appropriately from PT perspective.     OBJECTIVE IMPAIRMENTS: Abnormal gait, decreased activity tolerance, decreased balance, decreased endurance, decreased mobility, difficulty walking, decreased ROM, decreased strength, increased edema, impaired perceived functional ability, improper body mechanics, and pain.   ACTIVITY LIMITATIONS: carrying, lifting, sitting, standing, squatting, stairs, transfers, and locomotion level  PARTICIPATION LIMITATIONS: meal prep, cleaning,  laundry, and community activity  PERSONAL FACTORS: Age, Time since onset of injury/illness/exacerbation, and 1-2 comorbidities: GERD, HTN are also affecting patient's functional outcome.   REHAB POTENTIAL: Good  CLINICAL DECISION MAKING: Stable/uncomplicated  EVALUATION COMPLEXITY: Low   GOALS:  SHORT TERM GOALS: Target date: 01/04/2024  Pt will demonstrate appropriate understanding and performance of initially prescribed HEP in order to facilitate improved independence with management of symptoms.  Baseline: HEP established  Goal status: INITIAL   2. Pt will report at least 25% improvement in overall pain levels over past week in order to facilitate improved tolerance to typical daily activities.   Baseline: 3-8/10  Goal status: INITIAL    LONG TERM GOALS: Target date: 02/01/2024   Pt will score 45 or greater on LEFS in order to demonstrate improved perception of function due to symptoms (MCID 9 pts) Baseline: 6/80 Goal status: INITIAL  2.  Pt will demonstrate at least 0-110 degrees of knee AROM on surgical limb in order to facilitate improved tolerance to functional movements such as squatting, walking, and stair navigation.  Baseline: see ROM chart above Goal status: INITIAL  3.  Pt will demonstrate appropriate performance of final prescribed HEP in order to facilitate improved self-management of symptoms post-discharge.   Baseline: initial HEP prescribed   Goal status: INITIAL    4.  Pt will be able to perform TUG in less than or equal to 13 sec in order to indicate reduced risk of falling (cutoff score for fall risk 13.5 sec in community dwelling older adults per [facility] et al, 2000)  Baseline: 25sec RW  Goal status: INITIAL    5. Pt will report at least 50% decrease in overall pain levels in past week in order to facilitate improved tolerance to basic ADLs/mobility.   Baseline: 3-8/10  Goal status: INITIAL    6. Pt will demonstrate symmetrical knee MMT between  surgical and non-surgical limb in order to facilitate improved functional strength and mechanics.  Baseline: deferred on eval given proximity to surgery  Goal status: INITIAL     PLAN:  PT FREQUENCY: 2x/week  PT DURATION: 8 weeks  PLANNED INTERVENTIONS: 97164- PT Re-evaluation, 97750- Physical Performance Testing, 97110-Therapeutic exercises, 97530- Therapeutic activity, [id number]- Neuromuscular re-education, 97535- Self Care, 02859- Manual therapy, [phone]- Gait training, [phone]- Electrical stimulation (unattended), 97016- Vasopneumatic device, 20560 (1-2 muscles), 20561 (3+ muscles)- Dry Needling, Patient/Family education, Balance training, Stair training, Taping, Joint mobilization, Scar mobilization, Cryotherapy, and Moist heat  PLAN FOR NEXT SESSION: Review/update HEP PRN. Work on [employer] exercises as appropriate with emphasis on quad activation, knee mobility, functional mechanics, edema management. Symptom modification strategies as indicated/appropriate.    Alm DELENA Jenny PT, DPT 12/07/2023 11:59 AM

## 2023-12-07 ENCOUNTER — Encounter: Payer: Self-pay | Admitting: Physical Therapy

## 2023-12-07 ENCOUNTER — Ambulatory Visit: Attending: Orthopedic Surgery | Admitting: Physical Therapy

## 2023-12-07 ENCOUNTER — Other Ambulatory Visit: Payer: Self-pay

## 2023-12-07 DIAGNOSIS — R2689 Other abnormalities of gait and mobility: Secondary | ICD-10-CM | POA: Insufficient documentation

## 2023-12-07 DIAGNOSIS — R600 Localized edema: Secondary | ICD-10-CM | POA: Insufficient documentation

## 2023-12-07 DIAGNOSIS — Z471 Aftercare following joint replacement surgery: Secondary | ICD-10-CM | POA: Insufficient documentation

## 2023-12-07 DIAGNOSIS — M25662 Stiffness of left knee, not elsewhere classified: Secondary | ICD-10-CM | POA: Diagnosis not present

## 2023-12-07 DIAGNOSIS — Z96652 Presence of left artificial knee joint: Secondary | ICD-10-CM | POA: Diagnosis not present

## 2023-12-07 DIAGNOSIS — M25562 Pain in left knee: Secondary | ICD-10-CM | POA: Insufficient documentation

## 2023-12-12 ENCOUNTER — Ambulatory Visit

## 2023-12-12 DIAGNOSIS — R2689 Other abnormalities of gait and mobility: Secondary | ICD-10-CM | POA: Diagnosis not present

## 2023-12-12 DIAGNOSIS — M25662 Stiffness of left knee, not elsewhere classified: Secondary | ICD-10-CM | POA: Diagnosis not present

## 2023-12-12 DIAGNOSIS — R600 Localized edema: Secondary | ICD-10-CM | POA: Diagnosis not present

## 2023-12-12 DIAGNOSIS — M25562 Pain in left knee: Secondary | ICD-10-CM | POA: Diagnosis not present

## 2023-12-12 DIAGNOSIS — Z96652 Presence of left artificial knee joint: Secondary | ICD-10-CM | POA: Diagnosis not present

## 2023-12-12 DIAGNOSIS — Z471 Aftercare following joint replacement surgery: Secondary | ICD-10-CM | POA: Diagnosis not present

## 2023-12-12 NOTE — Therapy (Signed)
 OUTPATIENT PHYSICAL THERAPY LOWER EXTREMITY TREATMENT   Patient Name: Francisco Dixon MRN: [id number] DOB:[birth year], 73 y.o., male Today's Date: 12/12/2023  END OF SESSION:  PT End of Session - 12/12/23 1146     Visit Number 2    Number of Visits 17    Date for PT Re-Evaluation 02/01/24    Authorization Type medicare    Progress Note Due on Visit 10    PT Start Time 1146    PT Stop Time 1236    PT Time Calculation (min) 50 min    Activity Tolerance Patient tolerated treatment well    Behavior During Therapy WFL for tasks assessed/performed          Past Medical History:  Diagnosis Date   COVID-19 (12/16/2020) 12/17/2020   GERD (gastroesophageal reflux disease)    Hypercholesterolemia    Hypertension    labile    Plantar fasciitis, right 12/07/2016   Vitamin D  deficiency    Past Surgical History:  Procedure Laterality Date   COLONOSCOPY N/A 2002,2007,2012   ROTATOR CUFF REPAIR Right 2015   Patient Active Problem List   Diagnosis Date Noted   Primary osteoarthritis of left knee 07/13/2023   Hearing loss 07/09/2023   Hemangioma flammeus 07/09/2023   IFG (impaired fasting glucose) 07/09/2023   B12 deficiency 11/12/2018   Numbness and tingling of both feet 11/27/2017   Major depression in full remission (HCC) 07/09/2017   Medication management 07/07/2017   Abnormal glucose 07/07/2017   Allergy 07/07/2017   Labile hypertension 02/26/2016   Hyperlipidemia, mixed 02/26/2016   Vitamin D  deficiency 02/26/2016    PCP: Alvan Dorothyann BIRCH, MD  REFERRING PROVIDER: Yvone Rush, MD  REFERRING DIAG: [phone] (ICD-10-CM) - History of total left knee replacement (TKR)  THERAPY DIAG:  Left knee pain, unspecified chronicity  Stiffness of left knee, not elsewhere classified  Other abnormalities of gait and mobility  Localized edema  Rationale for Evaluation and Treatment: Rehabilitation  ONSET DATE: s/p L TKA DOS 8/20   SUBJECTIVE:   SUBJECTIVE  STATEMENT: Patient reports his knee is more achy than pain today; states he overdid it yesterday and did not sleep well due to pain in knee.   EVAL: Spouse currently assisting heavily with self care, housework. Has been working on exercises since surgery, feels pain but has been very adherent with them.  Prior to surgery pt was limited somewhat due to pain, but states he was doing yardwork, modified exercise classes 3x/week (stretching, aerobics, resistance training). Enjoys golfing, hiking, biking but was limited prior to surgery and using cane for a few weeks due to pain.  Is traveling to see daughter in beginning of October and wants to be active for that.   PERTINENT HISTORY: GERD, HTN PAIN:  Are you having pain: 3/10  Location/description: L knee, anterior and posterior Best-worst: 3-8/10; will tend to settle quickly   - aggravating factors: transfers, walking, stairs, housework, stretching - Easing factors: icing, movement    PRECAUTIONS: None  RED FLAGS: None Reports some nausea/dizziness w/ medications, improving No calf pain, fevers/chills, SOB Reports good compliance w/ post op aspirin   WEIGHT BEARING RESTRICTIONS: No  FALLS:  Has patient fallen in last 6 months? Yes. 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  DIAGNOSTIC FINDINGS:  S/p L TKA DOS 8/20   PATIENT SURVEYS:  LEFS: 6/80  COGNITION: Overall cognitive status: Within  functional limits for tasks assessed     EDEMA/INSPECTION:  Incision obscured by dressing and compression stocking, no calf pain or swelling, no TTP about calf, no pain w/ DF; no visible drainage   LOWER EXTREMITY ROM:      Right eval Left eval  Hip flexion    Hip extension    Hip internal rotation    Hip external rotation    Knee extension  A: lacking 8 deg w/ towel under calf   Knee flexion  P: 70-72 deg  (Blank rows = not tested) (Key: WFL = within functional limits not formally assessed, * = concordant pain, s = stiffness/stretching sensation, NT = not tested)  Comments:    LOWER EXTREMITY MMT:    MMT Right eval Left eval  Hip flexion    Hip abduction (modified sitting)    Hip internal rotation    Hip external rotation    Knee flexion    Knee extension    Ankle dorsiflexion     (Blank rows = not tested) (Key: WFL = within functional limits not formally assessed, * = concordant pain, s = stiffness/stretching sensation, NT = not tested)  Comments: deferred on eval given acuity of surgery   FUNCTIONAL TESTS:  TUG: 25.13 w RW; weight shift to RLE during transfer, BIL UE from RW  GAIT: Distance walked: within clinic Assistive device utilized: Environmental consultant - 2 wheeled Level of assistance: Modified independence Comments: step to pattern leading w/ LLE, discontinuous RW management, reduced knee ROM throughout all phases   [facility] Adult PT Treatment:                                                DATE: 12/12/2023 Neuromuscular re-ed: Supine quad set  Small range straight leg raise  Standing heel raises  Therapeutic Activity: NuStep to progress AAROM knee flexion/extension + subjective intake x 6 min Supine AAROM heel slides with strap Lateral and stride stance weight shifting Gait: Gait training with RW --> focus on heel strike and functional knee flexion  Modalities: Vaso (Lt) knee, 34 degrees, med compression x 10 min                                                                                                                             TREATMENT DATE:  [facility] Adult PT Treatment:  DATE: 12/07/23 Therapeutic Exercise: Seated quad sets, heel slides, heel/toe raises practice reps + HEP handout, education on modification PRN, verbally reviewed prior HEP from acute care Self Care: Education/discussion re: post op self care (red flag monitoring and appropriate action should they occur); encouraged communication w/ provider re: dressing management; appropriate icing regimen; frequent/short bouts of walking throughout day to encourage mobility; usual course of rehab post op TKA; exam findings as they relate to functional mobility   PATIENT EDUCATION:  Education details: Updated HEP Person educated: Patient Education method: Explanation, Demonstration, Tactile cues, Verbal cues Education comprehension: verbalized understanding, returned demonstration, verbal cues required, tactile cues required, and needs further education    HOME EXERCISE PROGRAM: Access Code: [email or web address] URL: [email or web address] Date: 12/12/2023 Prepared by: Lamarr Price  Exercises - Seated Quad Set  - 2-3 x daily - 1 sets - 10-15 reps - 5 sec hold - Seated Heel Slide  - 2-3 x daily - 1 sets - 10-15 reps - Seated Heel Toe Raises  - 2-3 x daily - 1 sets - 10-15 reps - Supine Knee Extension Strengthening  - 2-3 x daily - 1 sets - 10-15 reps - 5 sec hold - Small Range Straight Leg Raise  - 2-3 x daily - 1 sets - 10 reps - 3-5 sec hold - Supine Heel Slide with Strap  - 2-3 x daily - 1 sets - 10 reps - Stride Stance Weight Shift  - 2-3 x daily - 1 sets - 20 reps - Side to Side Weight Shift with Counter Support  - 2-3 x daily - 1 sets - 20 reps  ASSESSMENT:  CLINICAL IMPRESSION: Active assist knee flexion progressed in sitting and supine. Knee flexion in swing phase improved over time with cueing. Patient will continue to benefit  from skilled therapy to progress knee mobility to functional range and address strength deficits.    EVAL: Patient is a pleasant 73 y.o. gentleman who was seen today for physical therapy evaluation and treatment for L TKA DOS 8/20. Pt endorses difficulty w/ ADLs and functional mobility as expected for post surgical status. No red flags today. On exam he demonstrates concordant deficits in L knee ROM, altered gait/transfer mechanics, post op edema. TUG is indicative of reduced mobility and fall risk. Tolerates exam/HEP well overall, no adverse events, self care education as above. Recommend skilled PT to address aforementioned deficits with aim of improving functional tolerance and reducing pain with typical activities. Pt departs today's session in no acute distress, all voiced concerns/questions addressed appropriately from PT perspective.     OBJECTIVE IMPAIRMENTS: Abnormal gait, decreased activity tolerance, decreased balance, decreased endurance, decreased mobility, difficulty walking, decreased ROM, decreased strength, increased edema, impaired perceived functional ability, improper body mechanics, and pain.   ACTIVITY LIMITATIONS: carrying, lifting, sitting, standing, squatting, stairs, transfers, and locomotion level  PARTICIPATION LIMITATIONS: meal prep, cleaning, laundry, and community activity  PERSONAL FACTORS: Age, Time since onset of injury/illness/exacerbation, and 1-2 comorbidities: GERD, HTN are also affecting patient's functional outcome.   REHAB POTENTIAL: Good  CLINICAL DECISION MAKING: Stable/uncomplicated  EVALUATION COMPLEXITY: Low   GOALS:  SHORT TERM GOALS: Target date: 01/04/2024  Pt will demonstrate appropriate understanding and performance of initially prescribed HEP in order to facilitate improved independence with management of symptoms.  Baseline: HEP established  Goal status: INITIAL   2. Pt will report at least 25% improvement in overall pain levels over past  week in order to facilitate improved tolerance to typical daily activities.   Baseline:  3-8/10  Goal status: INITIAL    LONG TERM GOALS: Target date: 02/01/2024   Pt will score 45 or greater on LEFS in order to demonstrate improved perception of function due to symptoms (MCID 9 pts) Baseline: 6/80 Goal status: INITIAL  2.  Pt will demonstrate at least 0-110 degrees of knee AROM on surgical limb in order to facilitate improved tolerance to functional movements such as squatting, walking, and stair navigation.  Baseline: see ROM chart above Goal status: INITIAL  3.  Pt will demonstrate appropriate performance of final prescribed HEP in order to facilitate improved self-management of symptoms post-discharge.   Baseline: initial HEP prescribed   Goal status: INITIAL    4.  Pt will be able to perform TUG in less than or equal to 13 sec in order to indicate reduced risk of falling (cutoff score for fall risk 13.5 sec in community dwelling older adults per [facility] et al, 2000)  Baseline: 25sec RW  Goal status: INITIAL    5. Pt will report at least 50% decrease in overall pain levels in past week in order to facilitate improved tolerance to basic ADLs/mobility.   Baseline: 3-8/10  Goal status: INITIAL    6. Pt will demonstrate symmetrical knee MMT between surgical and non-surgical limb in order to facilitate improved functional strength and mechanics.  Baseline: deferred on eval given proximity to surgery  Goal status: INITIAL     PLAN:  PT FREQUENCY: 2x/week  PT DURATION: 8 weeks  PLANNED INTERVENTIONS: 97164- PT Re-evaluation, 97750- Physical Performance Testing, 97110-Therapeutic exercises, 97530- Therapeutic activity, [id number]- Neuromuscular re-education, 97535- Self Care, 02859- Manual therapy, [id number]- Gait training, [phone]- Electrical stimulation (unattended), 97016- Vasopneumatic device, 20560 (1-2 muscles), 20561 (3+ muscles)- Dry Needling, Patient/Family education, Balance  training, Stair training, Taping, Joint mobilization, Scar mobilization, Cryotherapy, and Moist heat  PLAN FOR NEXT SESSION: Review/update HEP PRN. Work on [employer] exercises as appropriate with emphasis on quad activation, knee mobility, functional mechanics, edema management. Symptom modification strategies as indicated/appropriate.    Lamarr Price, PTA 12/12/2023 12:39 PM

## 2023-12-14 ENCOUNTER — Telehealth: Payer: Self-pay | Admitting: Family Medicine

## 2023-12-14 ENCOUNTER — Telehealth: Payer: Self-pay

## 2023-12-14 ENCOUNTER — Ambulatory Visit

## 2023-12-14 DIAGNOSIS — Z96652 Presence of left artificial knee joint: Secondary | ICD-10-CM | POA: Diagnosis not present

## 2023-12-14 DIAGNOSIS — M25562 Pain in left knee: Secondary | ICD-10-CM | POA: Diagnosis not present

## 2023-12-14 DIAGNOSIS — R2689 Other abnormalities of gait and mobility: Secondary | ICD-10-CM

## 2023-12-14 DIAGNOSIS — M25662 Stiffness of left knee, not elsewhere classified: Secondary | ICD-10-CM

## 2023-12-14 DIAGNOSIS — Z471 Aftercare following joint replacement surgery: Secondary | ICD-10-CM | POA: Diagnosis not present

## 2023-12-14 DIAGNOSIS — R600 Localized edema: Secondary | ICD-10-CM

## 2023-12-14 NOTE — Telephone Encounter (Signed)
 Called in to inform the patient's provider that the walker needed to be prescribed by the patient's surgeon. Please be advised due to this the walker script is no longer needed and no callback is necessary at this time.

## 2023-12-14 NOTE — Telephone Encounter (Signed)
 Copied from CRM [id number]. Topic: Clinical - Order For Equipment >> Dec 14, 2023 10:59 AM Zane F wrote: Reason for CRM:   Caller: Mrs. Berthold  Called in to inform the patient's provider that the walker needed to be prescribed by the patient's surgeon. Please be advised due to this the walker script is no longer needed and no callback is necessary at this time.   Callback Number: [phone]

## 2023-12-14 NOTE — Therapy (Signed)
 OUTPATIENT PHYSICAL THERAPY LOWER EXTREMITY TREATMENT   Patient Name: Francisco Dixon MRN: [id number] DOB:Jul 18, 1950, 73 y.o., male Today's Date: 12/14/2023  END OF SESSION:  PT End of Session - 12/14/23 0848     Visit Number 3    Number of Visits 17    Date for PT Re-Evaluation 02/01/24    Authorization Type medicare    Progress Note Due on Visit 10    PT Start Time 0848    PT Stop Time 0940    PT Time Calculation (min) 52 min    Activity Tolerance Patient tolerated treatment well    Behavior During Therapy [facility] for tasks assessed/performed          Past Medical History:  Diagnosis Date   COVID-19 (12/16/2020) 12/17/2020   GERD (gastroesophageal reflux disease)    Hypercholesterolemia    Hypertension    labile    Plantar fasciitis, right 12/07/2016   Vitamin D  deficiency    Past Surgical History:  Procedure Laterality Date   COLONOSCOPY N/A 2002,2007,2012   ROTATOR CUFF REPAIR Right 2015   Patient Active Problem List   Diagnosis Date Noted   Primary osteoarthritis of left knee 07/13/2023   Hearing loss 07/09/2023   Hemangioma flammeus 07/09/2023   IFG (impaired fasting glucose) 07/09/2023   B12 deficiency 11/12/2018   Numbness and tingling of both feet 11/27/2017   Major depression in full remission (HCC) 07/09/2017   Medication management 07/07/2017   Abnormal glucose 07/07/2017   Allergy 07/07/2017   Labile hypertension 02/26/2016   Hyperlipidemia, mixed 02/26/2016   Vitamin D  deficiency 02/26/2016    PCP: Alvan Dorothyann BIRCH, MD  REFERRING PROVIDER: Yvone Rush, MD  REFERRING DIAG: [phone] (ICD-10-CM) - History of total left knee replacement (TKR)  THERAPY DIAG:  Left knee pain, unspecified chronicity  Stiffness of left knee, not elsewhere classified  Other abnormalities of gait and mobility  Localized edema  Rationale for Evaluation and Treatment: Rehabilitation  ONSET DATE: s/p L TKA DOS 8/20   SUBJECTIVE:   SUBJECTIVE  STATEMENT: Patient reports he did not sleep well last night due to pain in knee with electric shocks; states he took an oxycodone to help with pain and sleep. Patient has question about purchasing walker at proper height.  EVAL: Spouse currently assisting heavily with self care, housework. Has been working on exercises since surgery, feels pain but has been very adherent with them.  Prior to surgery pt was limited somewhat due to pain, but states he was doing yardwork, modified exercise classes 3x/week (stretching, aerobics, resistance training). Enjoys golfing, hiking, biking but was limited prior to surgery and using cane for a few weeks due to pain.  Is traveling to see daughter in beginning of October and wants to be active for that.   PERTINENT HISTORY: GERD, HTN PAIN:  Are you having pain: 3/10  Location/description: L knee, anterior and posterior Best-worst: 3-8/10; will tend to settle quickly   - aggravating factors: transfers, walking, stairs, housework, stretching - Easing factors: icing, movement    PRECAUTIONS: None  RED FLAGS: None Reports some nausea/dizziness w/ medications, improving No calf pain, fevers/chills, SOB Reports good compliance w/ post op aspirin   WEIGHT BEARING RESTRICTIONS: No  FALLS:  Has patient fallen in last 6 months? Yes. Number of falls 1 fall, walking puppy, denies injury, denies issues with balance  LIVING ENVIRONMENT: 1 story; entry through basement , 13 steps 1 rail to main level; front entrance 3+4 STE 1 rail with sloped entry Lives  w/ wife who is also retired  OCCUPATION: retired - was a Building services engineer for 40 years in [place]    PLOF: Independent  PATIENT GOALS: take trip to see daughter, wants to get back to normal and be more active again (hiking, golfing, biking)   NEXT MD VISIT: 12/18/23  OBJECTIVE:  Note: Objective measures were completed at Evaluation unless otherwise noted.  DIAGNOSTIC FINDINGS:  S/p L TKA DOS 8/20    PATIENT SURVEYS:  LEFS: 6/80  COGNITION: Overall cognitive status: Within functional limits for tasks assessed     EDEMA/INSPECTION:  Incision obscured by dressing and compression stocking, no calf pain or swelling, no TTP about calf, no pain w/ DF; no visible drainage   LOWER EXTREMITY ROM:      Right eval Left eval Left 12/14/23  Hip flexion     Hip extension     Hip internal rotation     Hip external rotation     Knee extension  A: lacking 8 deg w/ towel under calf  AROM 5 deg  Knee flexion  P: 70-72 deg AAROM 90 deg  (Blank rows = not tested) (Key: WFL = within functional limits not formally assessed, * = concordant pain, s = stiffness/stretching sensation, NT = not tested)  Comments:    LOWER EXTREMITY MMT:    MMT Right eval Left eval  Hip flexion    Hip abduction (modified sitting)    Hip internal rotation    Hip external rotation    Knee flexion    Knee extension    Ankle dorsiflexion     (Blank rows = not tested) (Key: WFL = within functional limits not formally assessed, * = concordant pain, s = stiffness/stretching sensation, NT = not tested)  Comments: deferred on eval given acuity of surgery   FUNCTIONAL TESTS:  TUG: 25.13 w RW; weight shift to RLE during transfer, BIL UE from RW  GAIT: Distance walked: within clinic Assistive device utilized: Environmental consultant - 2 wheeled Level of assistance: Modified independence Comments: step to pattern leading w/ LLE, discontinuous RW management, reduced knee ROM throughout all phases   [facility] Adult PT Treatment:                                                DATE: 12/14/2023 Neuromuscular re-ed: SAQ + black bolster x15 Quad set + foot propped on noodle LAQ x10 Therapeutic Activity: NuStep L2 x 5 min Seated knee flexion slides 12x5 Supine heel slides with orange PB Gait: Gait training with clinic FWW --> focus on knee flexion in swing phase Self Care: Standard walker prescription from PCP/surgeon   [facility]  Adult PT Treatment:                                                DATE: 12/12/2023 Neuromuscular re-ed: Supine quad set  Small range straight leg raise  Standing heel raises  Therapeutic Activity: NuStep to progress AAROM knee flexion/extension + subjective intake x 6 min Supine AAROM heel slides with strap Lateral and stride stance weight shifting Gait: Gait training with RW --> focus on heel strike and functional knee flexion  Modalities: Vaso (Lt) knee, 34 degrees, med compression x 10 min  TREATMENT DATE:  [facility] Adult PT Treatment:                                                DATE: 12/07/23 Therapeutic Exercise: Seated quad sets, heel slides, heel/toe raises practice reps + HEP handout, education on modification PRN, verbally reviewed prior HEP from acute care Self Care: Education/discussion re: post op self care (red flag monitoring and appropriate action should they occur); encouraged communication w/ provider re: dressing management; appropriate icing regimen; frequent/short bouts of walking throughout day to encourage mobility; usual course of rehab post op TKA; exam findings as they relate to functional mobility   PATIENT EDUCATION:  Education details: Updated HEP Person educated: Patient Education method: Explanation, Demonstration, Tactile cues, Verbal cues Education comprehension: verbalized understanding, returned demonstration, verbal cues required, tactile cues required, and needs further education    HOME EXERCISE PROGRAM: Access Code: [email or web address] URL: [email or web address] Date: 12/12/2023 Prepared by: Lamarr Price  Exercises - Seated Quad Set  - 2-3 x daily - 1 sets - 10-15 reps - 5 sec hold - Seated Heel Slide  - 2-3 x daily - 1 sets - 10-15 reps - Seated Heel Toe Raises  - 2-3 x daily - 1 sets - 10-15 reps - Supine Knee  Extension Strengthening  - 2-3 x daily - 1 sets - 10-15 reps - 5 sec hold - Small Range Straight Leg Raise  - 2-3 x daily - 1 sets - 10 reps - 3-5 sec hold - Supine Heel Slide with Strap  - 2-3 x daily - 1 sets - 10 reps - Stride Stance Weight Shift  - 2-3 x daily - 1 sets - 20 reps - Side to Side Weight Shift with Counter Support  - 2-3 x daily - 1 sets - 20 reps  ASSESSMENT:  CLINICAL IMPRESSION: Steady progress measured with knee flexion AAROM and knee extension AROM (see above). Patient continues to demonstrate decreased knee flexion during swing phase with gait; intermittent cueing needed to maintain awareness with body mechanics.   EVAL: Patient is a pleasant 73 y.o. gentleman who was seen today for physical therapy evaluation and treatment for L TKA DOS 8/20. Pt endorses difficulty w/ ADLs and functional mobility as expected for post surgical status. No red flags today. On exam he demonstrates concordant deficits in L knee ROM, altered gait/transfer mechanics, post op edema. TUG is indicative of reduced mobility and fall risk. Tolerates exam/HEP well overall, no adverse events, self care education as above. Recommend skilled PT to address aforementioned deficits with aim of improving functional tolerance and reducing pain with typical activities. Pt departs today's session in no acute distress, all voiced concerns/questions addressed appropriately from PT perspective.     OBJECTIVE IMPAIRMENTS: Abnormal gait, decreased activity tolerance, decreased balance, decreased endurance, decreased mobility, difficulty walking, decreased ROM, decreased strength, increased edema, impaired perceived functional ability, improper body mechanics, and pain.   ACTIVITY LIMITATIONS: carrying, lifting, sitting, standing, squatting, stairs, transfers, and locomotion level  PARTICIPATION LIMITATIONS: meal prep, cleaning, laundry, and community activity  PERSONAL FACTORS: Age, Time since onset of  injury/illness/exacerbation, and 1-2 comorbidities: GERD, HTN are also affecting patient's functional outcome.   REHAB POTENTIAL: Good  CLINICAL DECISION MAKING: Stable/uncomplicated  EVALUATION COMPLEXITY: Low   GOALS:  SHORT TERM GOALS: Target date: 01/04/2024  Pt will demonstrate appropriate understanding and performance of initially prescribed  HEP in order to facilitate improved independence with management of symptoms.  Baseline: HEP established  Goal status: INITIAL   2. Pt will report at least 25% improvement in overall pain levels over past week in order to facilitate improved tolerance to typical daily activities.   Baseline: 3-8/10  Goal status: INITIAL   LONG TERM GOALS: Target date: 02/01/2024   Pt will score 45 or greater on LEFS in order to demonstrate improved perception of function due to symptoms (MCID 9 pts) Baseline: 6/80 Goal status: INITIAL  2.  Pt will demonstrate at least 0-110 degrees of knee AROM on surgical limb in order to facilitate improved tolerance to functional movements such as squatting, walking, and stair navigation.  Baseline: see ROM chart above Goal status: INITIAL  3.  Pt will demonstrate appropriate performance of final prescribed HEP in order to facilitate improved self-management of symptoms post-discharge.   Baseline: initial HEP prescribed   Goal status: INITIAL    4.  Pt will be able to perform TUG in less than or equal to 13 sec in order to indicate reduced risk of falling (cutoff score for fall risk 13.5 sec in community dwelling older adults per [facility] et al, 2000)  Baseline: 25sec RW  Goal status: INITIAL    5. Pt will report at least 50% decrease in overall pain levels in past week in order to facilitate improved tolerance to basic ADLs/mobility.   Baseline: 3-8/10  Goal status: INITIAL    6. Pt will demonstrate symmetrical knee MMT between surgical and non-surgical limb in order to facilitate improved functional  strength and mechanics.  Baseline: deferred on eval given proximity to surgery  Goal status: INITIAL     PLAN:  PT FREQUENCY: 2x/week  PT DURATION: 8 weeks  PLANNED INTERVENTIONS: 97164- PT Re-evaluation, 97750- Physical Performance Testing, 97110-Therapeutic exercises, 97530- Therapeutic activity, [id number]- Neuromuscular re-education, 97535- Self Care, [id number]- Manual therapy, [id number]- Gait training, [id number]- Electrical stimulation (unattended), 97016- Vasopneumatic device, 20560 (1-2 muscles), 20561 (3+ muscles)- Dry Needling, Patient/Family education, Balance training, Stair training, Taping, Joint mobilization, Scar mobilization, Cryotherapy, and Moist heat  PLAN FOR NEXT SESSION: Review/update HEP PRN. Work on [employer] exercises as appropriate with emphasis on quad activation, knee mobility, functional mechanics, edema management. Gait training,  knee flexion on swing phase; progress AD as able. Symptom modification strategies as indicated/appropriate.    Lamarr Price, PTA 12/14/2023 9:35 AM

## 2023-12-14 NOTE — Telephone Encounter (Signed)
 Pt's wife is requesting a RX for a walker per PT. She also states that she wants to pick up the rx and take to the medical supply store of their choice

## 2023-12-14 NOTE — Telephone Encounter (Signed)
 Call pt anf preference for type of walker. And ok to write RX for this

## 2023-12-18 ENCOUNTER — Other Ambulatory Visit (HOSPITAL_COMMUNITY): Payer: Self-pay | Admitting: Orthopedic Surgery

## 2023-12-18 ENCOUNTER — Encounter: Payer: Self-pay | Admitting: Sports Medicine

## 2023-12-18 ENCOUNTER — Ambulatory Visit (HOSPITAL_COMMUNITY)
Admission: RE | Admit: 2023-12-18 | Discharge: 2023-12-18 | Disposition: A | Discharge status: Home / Self Care | Source: Ambulatory Visit | Attending: Vascular Surgery | Admitting: Vascular Surgery

## 2023-12-18 DIAGNOSIS — M79605 Pain in left leg: Secondary | ICD-10-CM | POA: Diagnosis not present

## 2023-12-18 DIAGNOSIS — M1712 Unilateral primary osteoarthritis, left knee: Secondary | ICD-10-CM | POA: Diagnosis not present

## 2023-12-18 NOTE — Therapy (Signed)
 OUTPATIENT PHYSICAL THERAPY LOWER EXTREMITY TREATMENT   Patient Name: Francisco Dixon MRN: [id number] DOB:06/12/1950, 73 y.o., male Today's Date: 12/19/2023  END OF SESSION:  PT End of Session - 12/19/23 1147     Visit Number 4    Number of Visits 17    Date for PT Re-Evaluation 02/01/24    Authorization Type medicare    Progress Note Due on Visit 10    PT Start Time 1148    PT Stop Time 1239    PT Time Calculation (min) 51 min    Activity Tolerance Patient tolerated treatment well           Past Medical History:  Diagnosis Date   COVID-19 (12/16/2020) 12/17/2020   GERD (gastroesophageal reflux disease)    Hypercholesterolemia    Hypertension    labile    Plantar fasciitis, right 12/07/2016   Vitamin D  deficiency    Past Surgical History:  Procedure Laterality Date   COLONOSCOPY N/A 2002,2007,2012   ROTATOR CUFF REPAIR Right 2015   Patient Active Problem List   Diagnosis Date Noted   Primary osteoarthritis of left knee 07/13/2023   Hearing loss 07/09/2023   Hemangioma flammeus 07/09/2023   IFG (impaired fasting glucose) 07/09/2023   B12 deficiency 11/12/2018   Numbness and tingling of both feet 11/27/2017   Major depression in full remission (HCC) 07/09/2017   Medication management 07/07/2017   Abnormal glucose 07/07/2017   Allergy 07/07/2017   Labile hypertension 02/26/2016   Hyperlipidemia, mixed 02/26/2016   Vitamin D  deficiency 02/26/2016    PCP: Alvan Dorothyann BIRCH, MD  REFERRING PROVIDER: Yvone Rush, MD  REFERRING DIAG: [phone] (ICD-10-CM) - History of total left knee replacement (TKR)  THERAPY DIAG:  Left knee pain, unspecified chronicity  Stiffness of left knee, not elsewhere classified  Other abnormalities of gait and mobility  Localized edema  Rationale for Evaluation and Treatment: Rehabilitation  ONSET DATE: s/p L TKA DOS 8/20   SUBJECTIVE:   SUBJECTIVE STATEMENT: 12/19/2023: states he saw surgeon yesterday, some initial  concern over some calf pain but had ultrasound which was negative. No other concerns. States pain and swelling tends to be main limiting factors. No issues after last session.    EVAL: Spouse currently assisting heavily with self care, housework. Has been working on exercises since surgery, feels pain but has been very adherent with them.  Prior to surgery pt was limited somewhat due to pain, but states he was doing yardwork, modified exercise classes 3x/week (stretching, aerobics, resistance training). Enjoys golfing, hiking, biking but was limited prior to surgery and using cane for a few weeks due to pain.  Is traveling to see daughter in beginning of October and wants to be active for that.   PERTINENT HISTORY: GERD, HTN PAIN:  Are you having pain: 5/10  Location/description: L knee, anterior and posterior Best-worst: 3-8/10; will tend to settle quickly   - aggravating factors: transfers, walking, stairs, housework, stretching - Easing factors: icing, movement    PRECAUTIONS: None  RED FLAGS: None Reports some nausea/dizziness w/ medications, improving No calf pain, fevers/chills, SOB Reports good compliance w/ post op aspirin   WEIGHT BEARING RESTRICTIONS: No  FALLS:  Has patient fallen in last 6 months? Yes. Number of falls 1 fall, walking puppy, denies injury, denies issues with balance  LIVING ENVIRONMENT: 1 story; entry through basement , 13 steps 1 rail to main level; front entrance 3+4 STE 1 rail with sloped entry Lives w/ wife who is also retired  OCCUPATION: retired - was a Building services engineer for 40 years in [place]    PLOF: Independent  PATIENT GOALS: take trip to see daughter, wants to get back to normal and be more active again (hiking, golfing, biking)   NEXT MD VISIT: 12/18/23  OBJECTIVE:  Note: Objective measures were completed at Evaluation unless otherwise noted.  DIAGNOSTIC FINDINGS:  S/p L TKA DOS 8/20   PATIENT SURVEYS:  LEFS:  6/80  COGNITION: Overall cognitive status: Within functional limits for tasks assessed     EDEMA/INSPECTION:  Incision obscured by dressing and compression stocking, no calf pain or swelling, no TTP about calf, no pain w/ DF; no visible drainage   LOWER EXTREMITY ROM:      Right eval Left eval Left 12/14/23 Left 12/19/23  Hip flexion      Hip extension      Hip internal rotation      Hip external rotation      Knee extension  A: lacking 8 deg w/ towel under calf  AROM 5 deg AROM 5 deg  Knee flexion  P: 70-72 deg AAROM 90 deg AAROM 101 deg  (Blank rows = not tested) (Key: WFL = within functional limits not formally assessed, * = concordant pain, s = stiffness/stretching sensation, NT = not tested)  Comments:    LOWER EXTREMITY MMT:    MMT Right eval Left eval  Hip flexion    Hip abduction (modified sitting)    Hip internal rotation    Hip external rotation    Knee flexion    Knee extension    Ankle dorsiflexion     (Blank rows = not tested) (Key: WFL = within functional limits not formally assessed, * = concordant pain, s = stiffness/stretching sensation, NT = not tested)  Comments: deferred on eval given acuity of surgery   FUNCTIONAL TESTS:  TUG: 25.13 w RW; weight shift to RLE during transfer, BIL UE from RW  GAIT: Distance walked: within clinic Assistive device utilized: Environmental consultant - 2 wheeled Level of assistance: Modified independence Comments: step to pattern leading w/ LLE, discontinuous RW management, reduced knee ROM throughout all phases    [facility] Adult PT Treatment:                                                DATE: 12/19/23  Neuromuscular re-ed: Bosu TKE push 2x8 LLE cues for form and quad activation Propped quad set on stool; 2x12 cues for quad activation and tactile facilitation   Therapeutic Activity: Nu step L3 5 min LE/UE during subjective for activity tolerance Standing mini lunge weight shift LLE fwd, 2x8 w UE support cues for setup and  comfortable ROM  Mini squat w/ UE support at treadmill 2x8 cues for symmetrical WB and reduced anterior tibial translation Education/discussion re: RW use, gait/ambulation tolerance, activity modification, progress w/ PT thus far  Modalities: Vaso (Lt) knee, 34 degrees, low compression x 10 min; LE elevated    [facility] Adult PT Treatment:                                                DATE: 12/14/2023 Neuromuscular re-ed: SAQ + black bolster x15 Quad set + foot propped on noodle LAQ x10  Therapeutic Activity: NuStep L2 x 5 min Seated knee flexion slides 12x5 Supine heel slides with orange PB Gait: Gait training with clinic FWW --> focus on knee flexion in swing phase Self Care: Standard walker prescription from PCP/surgeon   [facility] Adult PT Treatment:                                                DATE: 12/12/2023 Neuromuscular re-ed: Supine quad set  Small range straight leg raise  Standing heel raises  Therapeutic Activity: NuStep to progress AAROM knee flexion/extension + subjective intake x 6 min Supine AAROM heel slides with strap Lateral and stride stance weight shifting Gait: Gait training with RW --> focus on heel strike and functional knee flexion  Modalities: Vaso (Lt) knee, 34 degrees, med compression x 10 min                                                                                                                           [facility] Adult PT Treatment:                                                DATE: 12/07/23 Therapeutic Exercise: Seated quad sets, heel slides, heel/toe raises practice reps + HEP handout, education on modification PRN, verbally reviewed prior HEP from acute care Self Care: Education/discussion re: post op self care (red flag monitoring and appropriate action should they occur); encouraged communication w/ provider re: dressing management; appropriate icing regimen; frequent/short bouts of walking throughout day to encourage mobility; usual course  of rehab post op TKA; exam findings as they relate to functional mobility   PATIENT EDUCATION:  Education details: Updated HEP Person educated: Patient Education method: Explanation, Demonstration, Tactile cues, Verbal cues Education comprehension: verbalized understanding, returned demonstration, verbal cues required, tactile cues required, and needs further education    HOME EXERCISE PROGRAM: Access Code: [email or web address] URL: [email or web address] Date: 12/12/2023 Prepared by: Lamarr Price  Exercises - Seated Quad Set  - 2-3 x daily - 1 sets - 10-15 reps - 5 sec hold - Seated Heel Slide  - 2-3 x daily - 1 sets - 10-15 reps - Seated Heel Toe Raises  - 2-3 x daily - 1 sets - 10-15 reps - Supine Knee Extension Strengthening  - 2-3 x daily - 1 sets - 10-15 reps - 5 sec hold - Small Range Straight Leg Raise  - 2-3 x daily - 1 sets - 10 reps - 3-5 sec hold - Supine Heel Slide with Strap  - 2-3 x daily - 1 sets - 10 reps - Stride Stance Weight Shift  - 2-3 x daily - 1 sets - 20 reps - Side to Side Weight Shift with  Counter Support  - 2-3 x daily - 1 sets - 20 reps  ASSESSMENT:  CLINICAL IMPRESSION: 12/19/2023: Pt arrives w/ report of surgical follow up yesterday, had US  which was negative for clot. His knee flexion continues to improve, extension the same as last measurement. Today focusing on quad activation and knee mobility, progression of standing tasks to improve WB tolerance. Tolerates well without adverse event or increase in pain, vaso at end of session for edema/pain management. Recommend continuing along current POC in order to address relevant deficits and improve functional tolerance. Pt departs today's session in no acute distress, all voiced questions/concerns addressed appropriately from PT perspective.      EVAL: Patient is a pleasant 73 y.o. gentleman who was seen today for physical therapy evaluation and treatment for L TKA DOS 8/20. Pt endorses difficulty w/ ADLs  and functional mobility as expected for post surgical status. No red flags today. On exam he demonstrates concordant deficits in L knee ROM, altered gait/transfer mechanics, post op edema. TUG is indicative of reduced mobility and fall risk. Tolerates exam/HEP well overall, no adverse events, self care education as above. Recommend skilled PT to address aforementioned deficits with aim of improving functional tolerance and reducing pain with typical activities. Pt departs today's session in no acute distress, all voiced concerns/questions addressed appropriately from PT perspective.     OBJECTIVE IMPAIRMENTS: Abnormal gait, decreased activity tolerance, decreased balance, decreased endurance, decreased mobility, difficulty walking, decreased ROM, decreased strength, increased edema, impaired perceived functional ability, improper body mechanics, and pain.   ACTIVITY LIMITATIONS: carrying, lifting, sitting, standing, squatting, stairs, transfers, and locomotion level  PARTICIPATION LIMITATIONS: meal prep, cleaning, laundry, and community activity  PERSONAL FACTORS: Age, Time since onset of injury/illness/exacerbation, and 1-2 comorbidities: GERD, HTN are also affecting patient's functional outcome.   REHAB POTENTIAL: Good  CLINICAL DECISION MAKING: Stable/uncomplicated  EVALUATION COMPLEXITY: Low   GOALS:  SHORT TERM GOALS: Target date: 01/04/2024  Pt will demonstrate appropriate understanding and performance of initially prescribed HEP in order to facilitate improved independence with management of symptoms.  Baseline: HEP established  Goal status: INITIAL   2. Pt will report at least 25% improvement in overall pain levels over past week in order to facilitate improved tolerance to typical daily activities.   Baseline: 3-8/10  Goal status: INITIAL   LONG TERM GOALS: Target date: 02/01/2024   Pt will score 45 or greater on LEFS in order to demonstrate improved perception of function due  to symptoms (MCID 9 pts) Baseline: 6/80 Goal status: INITIAL  2.  Pt will demonstrate at least 0-110 degrees of knee AROM on surgical limb in order to facilitate improved tolerance to functional movements such as squatting, walking, and stair navigation.  Baseline: see ROM chart above Goal status: INITIAL  3.  Pt will demonstrate appropriate performance of final prescribed HEP in order to facilitate improved self-management of symptoms post-discharge.   Baseline: initial HEP prescribed   Goal status: INITIAL    4.  Pt will be able to perform TUG in less than or equal to 13 sec in order to indicate reduced risk of falling (cutoff score for fall risk 13.5 sec in community dwelling older adults per [facility] et al, 2000)  Baseline: 25sec RW  Goal status: INITIAL    5. Pt will report at least 50% decrease in overall pain levels in past week in order to facilitate improved tolerance to basic ADLs/mobility.   Baseline: 3-8/10  Goal status: INITIAL    6.  Pt will demonstrate symmetrical knee MMT between surgical and non-surgical limb in order to facilitate improved functional strength and mechanics.  Baseline: deferred on eval given proximity to surgery  Goal status: INITIAL     PLAN:  PT FREQUENCY: 2x/week  PT DURATION: 8 weeks  PLANNED INTERVENTIONS: 97164- PT Re-evaluation, 97750- Physical Performance Testing, 97110-Therapeutic exercises, 97530- Therapeutic activity, [id number]- Neuromuscular re-education, 97535- Self Care, [id number]- Manual therapy, [id number]- Gait training, [phone]- Electrical stimulation (unattended), 97016- Vasopneumatic device, 20560 (1-2 muscles), 20561 (3+ muscles)- Dry Needling, Patient/Family education, Balance training, Stair training, Taping, Joint mobilization, Scar mobilization, Cryotherapy, and Moist heat  PLAN FOR NEXT SESSION: Review/update HEP PRN. Work on [employer] exercises as appropriate with emphasis on quad activation, knee mobility, functional mechanics,  edema management. Gait training,  knee flexion on swing phase; progress AD as able. Symptom modification strategies as indicated/appropriate.    Alm DELENA Jenny PT, DPT 12/19/2023 12:44 PM

## 2023-12-19 ENCOUNTER — Encounter: Payer: Self-pay | Admitting: Physical Therapy

## 2023-12-19 ENCOUNTER — Ambulatory Visit: Attending: Orthopedic Surgery | Admitting: Physical Therapy

## 2023-12-19 DIAGNOSIS — R600 Localized edema: Secondary | ICD-10-CM | POA: Diagnosis not present

## 2023-12-19 DIAGNOSIS — M25662 Stiffness of left knee, not elsewhere classified: Secondary | ICD-10-CM | POA: Diagnosis not present

## 2023-12-19 DIAGNOSIS — R2689 Other abnormalities of gait and mobility: Secondary | ICD-10-CM | POA: Insufficient documentation

## 2023-12-19 DIAGNOSIS — M25562 Pain in left knee: Secondary | ICD-10-CM | POA: Insufficient documentation

## 2023-12-20 NOTE — Therapy (Signed)
 OUTPATIENT PHYSICAL THERAPY LOWER EXTREMITY TREATMENT   Patient Name: Francisco Dixon MRN: [id number] DOB:[birth year], 73 y.o., male Today's Date: 12/21/2023  END OF SESSION:  PT End of Session - 12/21/23 0845     Visit Number 5    Number of Visits 17    Date for PT Re-Evaluation 02/01/24    Authorization Type medicare    Progress Note Due on Visit 10    PT Start Time 0845    PT Stop Time 0933   vaso   PT Time Calculation (min) 48 min    Activity Tolerance Patient tolerated treatment well            Past Medical History:  Diagnosis Date   COVID-19 (12/16/2020) 12/17/2020   GERD (gastroesophageal reflux disease)    Hypercholesterolemia    Hypertension    labile    Plantar fasciitis, right 12/07/2016   Vitamin D  deficiency    Past Surgical History:  Procedure Laterality Date   COLONOSCOPY N/A 2002,2007,2012   ROTATOR CUFF REPAIR Right 2015   Patient Active Problem List   Diagnosis Date Noted   Primary osteoarthritis of left knee 07/13/2023   Hearing loss 07/09/2023   Hemangioma flammeus 07/09/2023   IFG (impaired fasting glucose) 07/09/2023   B12 deficiency 11/12/2018   Numbness and tingling of both feet 11/27/2017   Major depression in full remission (HCC) 07/09/2017   Medication management 07/07/2017   Abnormal glucose 07/07/2017   Allergy 07/07/2017   Labile hypertension 02/26/2016   Hyperlipidemia, mixed 02/26/2016   Vitamin D  deficiency 02/26/2016    PCP: Alvan Dorothyann BIRCH, MD  REFERRING PROVIDER: Yvone Rush, MD  REFERRING DIAG: [phone] (ICD-10-CM) - History of total left knee replacement (TKR)  THERAPY DIAG:  Left knee pain, unspecified chronicity  Stiffness of left knee, not elsewhere classified  Other abnormalities of gait and mobility  Localized edema  Rationale for Evaluation and Treatment: Rehabilitation  ONSET DATE: s/p L TKA DOS 8/20   SUBJECTIVE:   SUBJECTIVE STATEMENT: 12/21/2023: no pain at present. Felt good after  last session and states he feels like he is starting to notice improvement in his functional mobility (walking, car transfers)   EVAL: Spouse currently assisting heavily with self care, housework. Has been working on exercises since surgery, feels pain but has been very adherent with them.  Prior to surgery pt was limited somewhat due to pain, but states he was doing yardwork, modified exercise classes 3x/week (stretching, aerobics, resistance training). Enjoys golfing, hiking, biking but was limited prior to surgery and using cane for a few weeks due to pain.  Is traveling to see daughter in beginning of October and wants to be active for that.   PERTINENT HISTORY: GERD, HTN PAIN:  Are you having pain: none Location/description: L knee, anterior and posterior Best-worst: 3-8/10; will tend to settle quickly   - aggravating factors: transfers, walking, stairs, housework, stretching - Easing factors: icing, movement    PRECAUTIONS: None  RED FLAGS: None Reports some nausea/dizziness w/ medications, improving No calf pain, fevers/chills, SOB Reports good compliance w/ post op aspirin   WEIGHT BEARING RESTRICTIONS: No  FALLS:  Has patient fallen in last 6 months? Yes. Number of falls 1 fall, walking puppy, denies injury, denies issues with balance  LIVING ENVIRONMENT: 1 story; entry through basement , 13 steps 1 rail to main level; front entrance 3+4 STE 1 rail with sloped entry Lives w/ wife who is also retired  OCCUPATION: retired - was a Building services engineer for  40 years in [place]    PLOF: Independent  PATIENT GOALS: take trip to see daughter, wants to get back to normal and be more active again (hiking, golfing, biking)   NEXT MD VISIT: 12/18/23  OBJECTIVE:  Note: Objective measures were completed at Evaluation unless otherwise noted.  DIAGNOSTIC FINDINGS:  S/p L TKA DOS 8/20   PATIENT SURVEYS:  LEFS: 6/80  COGNITION: Overall cognitive status: Within functional limits for  tasks assessed     EDEMA/INSPECTION:  Incision obscured by dressing and compression stocking, no calf pain or swelling, no TTP about calf, no pain w/ DF; no visible drainage   LOWER EXTREMITY ROM:      Right eval Left eval Left 12/14/23 Left 12/19/23  Hip flexion      Hip extension      Hip internal rotation      Hip external rotation      Knee extension  A: lacking 8 deg w/ towel under calf  AROM 5 deg AROM 5 deg  Knee flexion  P: 70-72 deg AAROM 90 deg AAROM 101 deg  (Blank rows = not tested) (Key: WFL = within functional limits not formally assessed, * = concordant pain, s = stiffness/stretching sensation, NT = not tested)  Comments:    LOWER EXTREMITY MMT:    MMT Right eval Left eval  Hip flexion    Hip abduction (modified sitting)    Hip internal rotation    Hip external rotation    Knee flexion    Knee extension    Ankle dorsiflexion     (Blank rows = not tested) (Key: WFL = within functional limits not formally assessed, * = concordant pain, s = stiffness/stretching sensation, NT = not tested)  Comments: deferred on eval given acuity of surgery   FUNCTIONAL TESTS:  TUG: 25.13 w RW; weight shift to RLE during transfer, BIL UE from RW  GAIT: Distance walked: within clinic Assistive device utilized: Environmental consultant - 2 wheeled Level of assistance: Modified independence Comments: step to pattern leading w/ LLE, discontinuous RW management, reduced knee ROM throughout all phases    [facility] Adult PT Treatment:                                                DATE: 12/21/23  Neuromuscular re-ed: Bosu tke push 2x12 LLE cues for quad contraction Prone unresisted HS curl 2x8 LLE Fwd/retro walking along counter 4 laps, RUE support PRN CGA-SBA    Therapeutic Activity: Nu step L3 6 min LE/UE during subjective Weight shift onto standard step, UE support x12 cues for weight shifting  Fwd step up, 1 stack step; UE support 2x8 cues for mechanics  Education re: activity  modification, positioning, edema management HEP update + education  Modalities: Vaso (Lt) knee, 34 degrees, low compression x 10 min; LE elevated    [facility] Adult PT Treatment:                                                DATE: 12/19/23  Neuromuscular re-ed: Bosu TKE push 2x8 LLE cues for form and quad activation Propped quad set on stool; 2x12 cues for quad activation and tactile facilitation   Therapeutic Activity: Nu step L3 5  min LE/UE during subjective for activity tolerance Standing mini lunge weight shift LLE fwd, 2x8 w UE support cues for setup and comfortable ROM  Mini squat w/ UE support at treadmill 2x8 cues for symmetrical WB and reduced anterior tibial translation Education/discussion re: RW use, gait/ambulation tolerance, activity modification, progress w/ PT thus far  Modalities: Vaso (Lt) knee, 34 degrees, low compression x 10 min; LE elevated    [facility] Adult PT Treatment:                                                DATE: 12/14/2023 Neuromuscular re-ed: SAQ + black bolster x15 Quad set + foot propped on noodle LAQ x10 Therapeutic Activity: NuStep L2 x 5 min Seated knee flexion slides 12x5 Supine heel slides with orange PB Gait: Gait training with clinic FWW --> focus on knee flexion in swing phase Self Care: Standard walker prescription from PCP/surgeon   [facility] Adult PT Treatment:                                                DATE: 12/12/2023 Neuromuscular re-ed: Supine quad set  Small range straight leg raise  Standing heel raises  Therapeutic Activity: NuStep to progress AAROM knee flexion/extension + subjective intake x 6 min Supine AAROM heel slides with strap Lateral and stride stance weight shifting Gait: Gait training with RW --> focus on heel strike and functional knee flexion  Modalities: Vaso (Lt) knee, 34 degrees, med compression x 10 min                                                                                                                            PATIENT EDUCATION:  Education details: Updated HEP Person educated: Patient Education method: Explanation, Demonstration, Tactile cues, Verbal cues Education comprehension: verbalized understanding, returned demonstration, verbal cues required, tactile cues required, and needs further education    HOME EXERCISE PROGRAM: Access Code: [email or web address] URL: [email or web address] Date: 12/21/2023 Prepared by: Alm Jenny  Exercises - Seated Quad Set  - 2-3 x daily - 1 sets - 10-15 reps - 5 sec hold - Seated Heel Slide  - 2-3 x daily - 1 sets - 10-15 reps - Seated Heel Toe Raises  - 2-3 x daily - 1 sets - 10-15 reps - Supine Knee Extension Strengthening  - 2-3 x daily - 1 sets - 10-15 reps - 5 sec hold - Small Range Straight Leg Raise  - 2-3 x daily - 1 sets - 10 reps - 3-5 sec hold - Supine Heel Slide with Strap  - 2-3 x daily - 1 sets - 10 reps - Side to Side Weight Shift with Counter Support  - 2-3 x  daily - 1 sets - 20 reps - Standing Knee Flexion Stretch on Step  - 2-3 x daily - 1 sets - 8 reps - Prone Knee Flexion  - 2-3 x daily - 1 sets - 10 reps  ASSESSMENT:  CLINICAL IMPRESSION: 12/21/2023: Pt arrives w/ report of steady progress, no issues after last session. Continuing to work on open/closed chain quad activation, functional knee mobility. Also working on postural stability, reducing UE support which he does well with. Increased time to minimize compensations and ensure appropriate performance w/ HEP additions. Tolerates well with cues as above, no adverse events. Vaso at end of session for edema management. Recommend continuing along current POC in order to address relevant deficits and improve functional tolerance. Pt departs today's session in no acute distress, all voiced questions/concerns addressed appropriately from PT perspective.        EVAL: Patient is a pleasant 73 y.o. gentleman who was seen today for physical therapy evaluation and  treatment for L TKA DOS 8/20. Pt endorses difficulty w/ ADLs and functional mobility as expected for post surgical status. No red flags today. On exam he demonstrates concordant deficits in L knee ROM, altered gait/transfer mechanics, post op edema. TUG is indicative of reduced mobility and fall risk. Tolerates exam/HEP well overall, no adverse events, self care education as above. Recommend skilled PT to address aforementioned deficits with aim of improving functional tolerance and reducing pain with typical activities. Pt departs today's session in no acute distress, all voiced concerns/questions addressed appropriately from PT perspective.     OBJECTIVE IMPAIRMENTS: Abnormal gait, decreased activity tolerance, decreased balance, decreased endurance, decreased mobility, difficulty walking, decreased ROM, decreased strength, increased edema, impaired perceived functional ability, improper body mechanics, and pain.   ACTIVITY LIMITATIONS: carrying, lifting, sitting, standing, squatting, stairs, transfers, and locomotion level  PARTICIPATION LIMITATIONS: meal prep, cleaning, laundry, and community activity  PERSONAL FACTORS: Age, Time since onset of injury/illness/exacerbation, and 1-2 comorbidities: GERD, HTN are also affecting patient's functional outcome.   REHAB POTENTIAL: Good  CLINICAL DECISION MAKING: Stable/uncomplicated  EVALUATION COMPLEXITY: Low   GOALS:  SHORT TERM GOALS: Target date: 01/04/2024  Pt will demonstrate appropriate understanding and performance of initially prescribed HEP in order to facilitate improved independence with management of symptoms.  Baseline: HEP established  Goal status: INITIAL   2. Pt will report at least 25% improvement in overall pain levels over past week in order to facilitate improved tolerance to typical daily activities.   Baseline: 3-8/10  Goal status: INITIAL   LONG TERM GOALS: Target date: 02/01/2024   Pt will score 45 or greater on LEFS  in order to demonstrate improved perception of function due to symptoms (MCID 9 pts) Baseline: 6/80 Goal status: INITIAL  2.  Pt will demonstrate at least 0-110 degrees of knee AROM on surgical limb in order to facilitate improved tolerance to functional movements such as squatting, walking, and stair navigation.  Baseline: see ROM chart above Goal status: INITIAL  3.  Pt will demonstrate appropriate performance of final prescribed HEP in order to facilitate improved self-management of symptoms post-discharge.   Baseline: initial HEP prescribed   Goal status: INITIAL    4.  Pt will be able to perform TUG in less than or equal to 13 sec in order to indicate reduced risk of falling (cutoff score for fall risk 13.5 sec in community dwelling older adults per [facility] et al, 2000)  Baseline: 25sec RW  Goal status: INITIAL    5. Pt will  report at least 50% decrease in overall pain levels in past week in order to facilitate improved tolerance to basic ADLs/mobility.   Baseline: 3-8/10  Goal status: INITIAL    6. Pt will demonstrate symmetrical knee MMT between surgical and non-surgical limb in order to facilitate improved functional strength and mechanics.  Baseline: deferred on eval given proximity to surgery  Goal status: INITIAL     PLAN:  PT FREQUENCY: 2x/week  PT DURATION: 8 weeks  PLANNED INTERVENTIONS: 97164- PT Re-evaluation, 97750- Physical Performance Testing, 97110-Therapeutic exercises, 97530- Therapeutic activity, [id number]- Neuromuscular re-education, 97535- Self Care, [id number]- Manual therapy, [id number]- Gait training, [phone]- Electrical stimulation (unattended), 97016- Vasopneumatic device, 20560 (1-2 muscles), 20561 (3+ muscles)- Dry Needling, Patient/Family education, Balance training, Stair training, Taping, Joint mobilization, Scar mobilization, Cryotherapy, and Moist heat  PLAN FOR NEXT SESSION: Review/update HEP PRN. Work on [employer] exercises as appropriate with emphasis  on quad activation, knee mobility, functional mechanics, edema management. Gait training,  knee flexion on swing phase; progress AD as able. Symptom modification strategies as indicated/appropriate.    Alm DELENA Jenny PT, DPT 12/21/2023 10:18 AM

## 2023-12-21 ENCOUNTER — Ambulatory Visit: Admitting: Physical Therapy

## 2023-12-21 ENCOUNTER — Encounter: Payer: Self-pay | Admitting: Physical Therapy

## 2023-12-21 DIAGNOSIS — M25562 Pain in left knee: Secondary | ICD-10-CM

## 2023-12-21 DIAGNOSIS — M25662 Stiffness of left knee, not elsewhere classified: Secondary | ICD-10-CM

## 2023-12-21 DIAGNOSIS — R2689 Other abnormalities of gait and mobility: Secondary | ICD-10-CM | POA: Diagnosis not present

## 2023-12-21 DIAGNOSIS — R600 Localized edema: Secondary | ICD-10-CM

## 2023-12-25 ENCOUNTER — Encounter: Payer: Self-pay | Admitting: Physical Therapy

## 2023-12-25 ENCOUNTER — Ambulatory Visit: Admitting: Physical Therapy

## 2023-12-25 DIAGNOSIS — M25662 Stiffness of left knee, not elsewhere classified: Secondary | ICD-10-CM | POA: Diagnosis not present

## 2023-12-25 DIAGNOSIS — R2689 Other abnormalities of gait and mobility: Secondary | ICD-10-CM | POA: Diagnosis not present

## 2023-12-25 DIAGNOSIS — M25562 Pain in left knee: Secondary | ICD-10-CM

## 2023-12-25 DIAGNOSIS — R600 Localized edema: Secondary | ICD-10-CM | POA: Diagnosis not present

## 2023-12-25 NOTE — Therapy (Signed)
 OUTPATIENT PHYSICAL THERAPY LOWER EXTREMITY TREATMENT   Patient Name: Francisco Dixon MRN: [id number] DOB:24-Nov-1950, 73 y.o., male Today's Date: 12/25/2023  END OF SESSION:  PT End of Session - 12/25/23 1104     Visit Number 6    Number of Visits 17    Date for PT Re-Evaluation 02/01/24    Authorization Type medicare    Progress Note Due on Visit 10    PT Start Time 1104    PT Stop Time 1147   10 min vaso end of session   PT Time Calculation (min) 43 min             Past Medical History:  Diagnosis Date   COVID-19 (12/16/2020) 12/17/2020   GERD (gastroesophageal reflux disease)    Hypercholesterolemia    Hypertension    labile    Plantar fasciitis, right 12/07/2016   Vitamin D  deficiency    Past Surgical History:  Procedure Laterality Date   COLONOSCOPY N/A 2002,2007,2012   ROTATOR CUFF REPAIR Right 2015   Patient Active Problem List   Diagnosis Date Noted   Primary osteoarthritis of left knee 07/13/2023   Hearing loss 07/09/2023   Hemangioma flammeus 07/09/2023   IFG (impaired fasting glucose) 07/09/2023   B12 deficiency 11/12/2018   Numbness and tingling of both feet 11/27/2017   Major depression in full remission (HCC) 07/09/2017   Medication management 07/07/2017   Abnormal glucose 07/07/2017   Allergy 07/07/2017   Labile hypertension 02/26/2016   Hyperlipidemia, mixed 02/26/2016   Vitamin D  deficiency 02/26/2016    PCP: Alvan Dorothyann BIRCH, MD  REFERRING PROVIDER: Yvone Rush, MD  REFERRING DIAG: [phone] (ICD-10-CM) - History of total left knee replacement (TKR)  THERAPY DIAG:  Left knee pain, unspecified chronicity  Stiffness of left knee, not elsewhere classified  Other abnormalities of gait and mobility  Localized edema  Rationale for Evaluation and Treatment: Rehabilitation  ONSET DATE: s/p L TKA DOS 8/20   SUBJECTIVE:   SUBJECTIVE STATEMENT: 12/25/2023: states he over did it a bit last session, bothered him for a couple days but  back to baseline by Sunday. States he really wants to get off walker. Feels stiff with walking.    EVAL: Spouse currently assisting heavily with self care, housework. Has been working on exercises since surgery, feels pain but has been very adherent with them.  Prior to surgery pt was limited somewhat due to pain, but states he was doing yardwork, modified exercise classes 3x/week (stretching, aerobics, resistance training). Enjoys golfing, hiking, biking but was limited prior to surgery and using cane for a few weeks due to pain.  Is traveling to see daughter in beginning of October and wants to be active for that.   PERTINENT HISTORY: GERD, HTN PAIN:  Are you having pain: none Location/description: L knee, anterior and posterior Best-worst: 3-8/10; will tend to settle quickly   - aggravating factors: transfers, walking, stairs, housework, stretching - Easing factors: icing, movement    PRECAUTIONS: None  RED FLAGS: None Reports some nausea/dizziness w/ medications, improving No calf pain, fevers/chills, SOB Reports good compliance w/ post op aspirin   WEIGHT BEARING RESTRICTIONS: No  FALLS:  Has patient fallen in last 6 months? Yes. Number of falls 1 fall, walking puppy, denies injury, denies issues with balance  LIVING ENVIRONMENT: 1 story; entry through basement , 13 steps 1 rail to main level; front entrance 3+4 STE 1 rail with sloped entry Lives w/ wife who is also retired  OCCUPATION: retired - was  a forester for 40 years in [place]    PLOF: Independent  PATIENT GOALS: take trip to see daughter, wants to get back to normal and be more active again (hiking, golfing, biking)   NEXT MD VISIT: 12/18/23  OBJECTIVE:  Note: Objective measures were completed at Evaluation unless otherwise noted.  DIAGNOSTIC FINDINGS:  S/p L TKA DOS 8/20   PATIENT SURVEYS:  LEFS: 6/80  COGNITION: Overall cognitive status: Within functional limits for tasks  assessed     EDEMA/INSPECTION:  Incision obscured by dressing and compression stocking, no calf pain or swelling, no TTP about calf, no pain w/ DF; no visible drainage   LOWER EXTREMITY ROM:      Right eval Left eval Left 12/14/23 Left 12/19/23  Hip flexion      Hip extension      Hip internal rotation      Hip external rotation      Knee extension  A: lacking 8 deg w/ towel under calf  AROM 5 deg AROM 5 deg  Knee flexion  P: 70-72 deg AAROM 90 deg AAROM 101 deg  (Blank rows = not tested) (Key: WFL = within functional limits not formally assessed, * = concordant pain, s = stiffness/stretching sensation, NT = not tested)  Comments:    LOWER EXTREMITY MMT:    MMT Right eval Left eval  Hip flexion    Hip abduction (modified sitting)    Hip internal rotation    Hip external rotation    Knee flexion    Knee extension    Ankle dorsiflexion     (Blank rows = not tested) (Key: WFL = within functional limits not formally assessed, * = concordant pain, s = stiffness/stretching sensation, NT = not tested)  Comments: deferred on eval given acuity of surgery   FUNCTIONAL TESTS:  TUG: 25.13 w RW; weight shift to RLE during transfer, BIL UE from RW  GAIT: Distance walked: within clinic Assistive device utilized: Environmental consultant - 2 wheeled Level of assistance: Modified independence Comments: step to pattern leading w/ LLE, discontinuous RW management, reduced knee ROM throughout all phases    [facility] Adult PT Treatment:                                                DATE: 12/25/23 Therapeutic Exercise: Standing heel raises 2x12 in walker  Supine pball hamstring curl, BIL LE 2x8 cues for form and comfortable  HEP discussion/education  Neuromuscular re-ed: Quad set propped on bosu, 2x12 superset w/ below (second round w/ strap for DF)  Bosu TKE push 2x12 superset w/ above SPC ambulation; 4x72ft, emphasis on placement and motor control of surgical limb   Therapeutic  Activity: Ambulation w/ RW, 3x16ft with exaggerated knee ROM  Modalities: Vaso (Lt) knee, 34 degrees, low compression x 10 min; LE elevated    [facility] Adult PT Treatment:                                                DATE: 12/21/23  Neuromuscular re-ed: Bosu tke push 2x12 LLE cues for quad contraction Prone unresisted HS curl 2x8 LLE Fwd/retro walking along counter 4 laps, RUE support PRN CGA-SBA    Therapeutic Activity: Nu step L3  6 min LE/UE during subjective Weight shift onto standard step, UE support x12 cues for weight shifting  Fwd step up, 1 stack step; UE support 2x8 cues for mechanics  Education re: activity modification, positioning, edema management HEP update + education  Modalities: Vaso (Lt) knee, 34 degrees, low compression x 10 min; LE elevated    [facility] Adult PT Treatment:                                                DATE: 12/19/23  Neuromuscular re-ed: Bosu TKE push 2x8 LLE cues for form and quad activation Propped quad set on stool; 2x12 cues for quad activation and tactile facilitation   Therapeutic Activity: Nu step L3 5 min LE/UE during subjective for activity tolerance Standing mini lunge weight shift LLE fwd, 2x8 w UE support cues for setup and comfortable ROM  Mini squat w/ UE support at treadmill 2x8 cues for symmetrical WB and reduced anterior tibial translation Education/discussion re: RW use, gait/ambulation tolerance, activity modification, progress w/ PT thus far  Modalities: Vaso (Lt) knee, 34 degrees, low compression x 10 min; LE elevated    [facility] Adult PT Treatment:                                                DATE: 12/14/2023 Neuromuscular re-ed: SAQ + black bolster x15 Quad set + foot propped on noodle LAQ x10 Therapeutic Activity: NuStep L2 x 5 min Seated knee flexion slides 12x5 Supine heel slides with orange PB Gait: Gait training with clinic FWW --> focus on knee flexion in swing phase Self Care: Standard walker prescription  from PCP/surgeon                                                                                                                          PATIENT EDUCATION:  Education details: Updated HEP Person educated: Patient Education method: Explanation, Demonstration, Tactile cues, Verbal cues Education comprehension: verbalized understanding, returned demonstration, verbal cues required, tactile cues required, and needs further education    HOME EXERCISE PROGRAM: Access Code: [email or web address] URL: [email or web address] Date: 12/21/2023 Prepared by: Alm Jenny  Exercises - Seated Quad Set  - 2-3 x daily - 1 sets - 10-15 reps - 5 sec hold - Seated Heel Slide  - 2-3 x daily - 1 sets - 10-15 reps - Seated Heel Toe Raises  - 2-3 x daily - 1 sets - 10-15 reps - Supine Knee Extension Strengthening  - 2-3 x daily - 1 sets - 10-15 reps - 5 sec hold - Small Range Straight Leg Raise  - 2-3 x daily - 1 sets - 10 reps - 3-5 sec hold - Supine Heel Slide with Strap  -  2-3 x daily - 1 sets - 10 reps - Side to Side Weight Shift with Counter Support  - 2-3 x daily - 1 sets - 20 reps - Standing Knee Flexion Stretch on Step  - 2-3 x daily - 1 sets - 8 reps - Prone Knee Flexion  - 2-3 x daily - 1 sets - 10 reps  ASSESSMENT:  CLINICAL IMPRESSION: 12/25/2023: Pt arrives w/ report of soreness for a couple days after last session but overall continued improvement. Today continuing to work on knee mobility in open and closed chain, trial of SPC emphasizing postural stability and motor control of surgical limb. Tolerates well with no adverse events or increases in pain. Vaso at end of session for edema management and to mitigate soreness. Recommend continuing along current POC in order to address relevant deficits and improve functional tolerance. Pt departs today's session in no acute distress, all voiced questions/concerns addressed appropriately from PT perspective.      EVAL: Patient is a pleasant 73 y.o.  gentleman who was seen today for physical therapy evaluation and treatment for L TKA DOS 8/20. Pt endorses difficulty w/ ADLs and functional mobility as expected for post surgical status. No red flags today. On exam he demonstrates concordant deficits in L knee ROM, altered gait/transfer mechanics, post op edema. TUG is indicative of reduced mobility and fall risk. Tolerates exam/HEP well overall, no adverse events, self care education as above. Recommend skilled PT to address aforementioned deficits with aim of improving functional tolerance and reducing pain with typical activities. Pt departs today's session in no acute distress, all voiced concerns/questions addressed appropriately from PT perspective.     OBJECTIVE IMPAIRMENTS: Abnormal gait, decreased activity tolerance, decreased balance, decreased endurance, decreased mobility, difficulty walking, decreased ROM, decreased strength, increased edema, impaired perceived functional ability, improper body mechanics, and pain.   ACTIVITY LIMITATIONS: carrying, lifting, sitting, standing, squatting, stairs, transfers, and locomotion level  PARTICIPATION LIMITATIONS: meal prep, cleaning, laundry, and community activity  PERSONAL FACTORS: Age, Time since onset of injury/illness/exacerbation, and 1-2 comorbidities: GERD, HTN are also affecting patient's functional outcome.   REHAB POTENTIAL: Good  CLINICAL DECISION MAKING: Stable/uncomplicated  EVALUATION COMPLEXITY: Low   GOALS:  SHORT TERM GOALS: Target date: 01/04/2024  Pt will demonstrate appropriate understanding and performance of initially prescribed HEP in order to facilitate improved independence with management of symptoms.  Baseline: HEP established  Goal status: INITIAL   2. Pt will report at least 25% improvement in overall pain levels over past week in order to facilitate improved tolerance to typical daily activities.   Baseline: 3-8/10  Goal status: INITIAL   LONG TERM  GOALS: Target date: 02/01/2024   Pt will score 45 or greater on LEFS in order to demonstrate improved perception of function due to symptoms (MCID 9 pts) Baseline: 6/80 Goal status: INITIAL  2.  Pt will demonstrate at least 0-110 degrees of knee AROM on surgical limb in order to facilitate improved tolerance to functional movements such as squatting, walking, and stair navigation.  Baseline: see ROM chart above Goal status: INITIAL  3.  Pt will demonstrate appropriate performance of final prescribed HEP in order to facilitate improved self-management of symptoms post-discharge.   Baseline: initial HEP prescribed   Goal status: INITIAL    4.  Pt will be able to perform TUG in less than or equal to 13 sec in order to indicate reduced risk of falling (cutoff score for fall risk 13.5 sec in community dwelling older adults per  Shumway-Cook et al, 2000)  Baseline: 25sec RW  Goal status: INITIAL    5. Pt will report at least 50% decrease in overall pain levels in past week in order to facilitate improved tolerance to basic ADLs/mobility.   Baseline: 3-8/10  Goal status: INITIAL    6. Pt will demonstrate symmetrical knee MMT between surgical and non-surgical limb in order to facilitate improved functional strength and mechanics.  Baseline: deferred on eval given proximity to surgery  Goal status: INITIAL     PLAN:  PT FREQUENCY: 2x/week  PT DURATION: 8 weeks  PLANNED INTERVENTIONS: 97164- PT Re-evaluation, 97750- Physical Performance Testing, 97110-Therapeutic exercises, 97530- Therapeutic activity, [id number]- Neuromuscular re-education, 97535- Self Care, [id number]- Manual therapy, [id number]- Gait training, [phone]- Electrical stimulation (unattended), 97016- Vasopneumatic device, 20560 (1-2 muscles), 20561 (3+ muscles)- Dry Needling, Patient/Family education, Balance training, Stair training, Taping, Joint mobilization, Scar mobilization, Cryotherapy, and Moist heat  PLAN FOR NEXT SESSION:  Review/update HEP PRN. Work on [employer] exercises as appropriate with emphasis on quad activation, knee mobility, functional mechanics, edema management. Gait training,  knee flexion on swing phase; progress AD as able. Symptom modification strategies as indicated/appropriate.    Alm DELENA Jenny PT, DPT 12/25/2023 12:38 PM

## 2023-12-27 ENCOUNTER — Encounter: Payer: Self-pay | Admitting: Physical Therapy

## 2023-12-27 ENCOUNTER — Ambulatory Visit: Admitting: Physical Therapy

## 2023-12-27 DIAGNOSIS — M25662 Stiffness of left knee, not elsewhere classified: Secondary | ICD-10-CM

## 2023-12-27 DIAGNOSIS — R600 Localized edema: Secondary | ICD-10-CM

## 2023-12-27 DIAGNOSIS — M25562 Pain in left knee: Secondary | ICD-10-CM | POA: Diagnosis not present

## 2023-12-27 DIAGNOSIS — R2689 Other abnormalities of gait and mobility: Secondary | ICD-10-CM | POA: Diagnosis not present

## 2023-12-27 NOTE — Therapy (Signed)
 OUTPATIENT PHYSICAL THERAPY LOWER EXTREMITY TREATMENT   Patient Name: Francisco Dixon MRN: [id number] DOB:[birth year], 73 y.o., male Today's Date: 12/27/2023  END OF SESSION:  PT End of Session - 12/27/23 1315     Visit Number 7    Number of Visits 17    Date for PT Re-Evaluation 02/01/24    Authorization Type medicare    Progress Note Due on Visit 10    PT Start Time 1315    PT Stop Time 1402   vaso   PT Time Calculation (min) 47 min              Past Medical History:  Diagnosis Date   COVID-19 (12/16/2020) 12/17/2020   GERD (gastroesophageal reflux disease)    Hypercholesterolemia    Hypertension    labile    Plantar fasciitis, right 12/07/2016   Vitamin D  deficiency    Past Surgical History:  Procedure Laterality Date   COLONOSCOPY N/A 2002,2007,2012   ROTATOR CUFF REPAIR Right 2015   Patient Active Problem List   Diagnosis Date Noted   Primary osteoarthritis of left knee 07/13/2023   Hearing loss 07/09/2023   Hemangioma flammeus 07/09/2023   IFG (impaired fasting glucose) 07/09/2023   B12 deficiency 11/12/2018   Numbness and tingling of both feet 11/27/2017   Major depression in full remission (HCC) 07/09/2017   Medication management 07/07/2017   Abnormal glucose 07/07/2017   Allergy 07/07/2017   Labile hypertension 02/26/2016   Hyperlipidemia, mixed 02/26/2016   Vitamin D  deficiency 02/26/2016    PCP: Alvan Dorothyann BIRCH, MD  REFERRING PROVIDER: Yvone Rush, MD  REFERRING DIAG: [phone] (ICD-10-CM) - History of total left knee replacement (TKR)  THERAPY DIAG:  Left knee pain, unspecified chronicity  Stiffness of left knee, not elsewhere classified  Other abnormalities of gait and mobility  Localized edema  Rationale for Evaluation and Treatment: Rehabilitation  ONSET DATE: s/p L TKA DOS 8/20   SUBJECTIVE:   SUBJECTIVE STATEMENT: 12/27/2023: felt good after last session, no pain today. Feels excellent. No other new  updates   EVAL: Spouse currently assisting heavily with self care, housework. Has been working on exercises since surgery, feels pain but has been very adherent with them.  Prior to surgery pt was limited somewhat due to pain, but states he was doing yardwork, modified exercise classes 3x/week (stretching, aerobics, resistance training). Enjoys golfing, hiking, biking but was limited prior to surgery and using cane for a few weeks due to pain.  Is traveling to see daughter in beginning of October and wants to be active for that.   PERTINENT HISTORY: GERD, HTN PAIN:  Are you having pain: none  Per eval:  Location/description: L knee, anterior and posterior Best-worst: 3-8/10; will tend to settle quickly   - aggravating factors: transfers, walking, stairs, housework, stretching - Easing factors: icing, movement    PRECAUTIONS: None  RED FLAGS: None Reports some nausea/dizziness w/ medications, improving No calf pain, fevers/chills, SOB Reports good compliance w/ post op aspirin   WEIGHT BEARING RESTRICTIONS: No  FALLS:  Has patient fallen in last 6 months? Yes. Number of falls 1 fall, walking puppy, denies injury, denies issues with balance  LIVING ENVIRONMENT: 1 story; entry through basement , 13 steps 1 rail to main level; front entrance 3+4 STE 1 rail with sloped entry Lives w/ wife who is also retired  OCCUPATION: retired - was a Building services engineer for 40 years in [place]    PLOF: Independent  PATIENT GOALS: take trip to  see daughter, wants to get back to normal and be more active again (hiking, golfing, biking)   NEXT MD VISIT: October   OBJECTIVE:  Note: Objective measures were completed at Evaluation unless otherwise noted.  DIAGNOSTIC FINDINGS:  S/p L TKA DOS 8/20   PATIENT SURVEYS:  LEFS: 6/80  COGNITION: Overall cognitive status: Within functional limits for tasks assessed     EDEMA/INSPECTION:  Incision obscured by dressing and compression stocking, no  calf pain or swelling, no TTP about calf, no pain w/ DF; no visible drainage   LOWER EXTREMITY ROM:      Right eval Left eval Left 12/14/23 Left 12/19/23 Left 12/27/23  Hip flexion       Hip extension       Hip internal rotation       Hip external rotation       Knee extension  A: lacking 8 deg w/ towel under calf  AROM 5 deg AROM 5 deg   Knee flexion  P: 70-72 deg AAROM 90 deg AAROM 101 deg AAROM 109 on bike  AROM supine 114 deg  (Blank rows = not tested) (Key: WFL = within functional limits not formally assessed, * = concordant pain, s = stiffness/stretching sensation, NT = not tested)  Comments:    LOWER EXTREMITY MMT:    MMT Right eval Left eval  Hip flexion    Hip abduction (modified sitting)    Hip internal rotation    Hip external rotation    Knee flexion    Knee extension    Ankle dorsiflexion     (Blank rows = not tested) (Key: WFL = within functional limits not formally assessed, * = concordant pain, s = stiffness/stretching sensation, NT = not tested)  Comments: deferred on eval given acuity of surgery   FUNCTIONAL TESTS:  TUG: 25.13 w RW; weight shift to RLE during transfer, BIL UE from RW  GAIT: Distance walked: within clinic Assistive device utilized: Environmental consultant - 2 wheeled Level of assistance: Modified independence Comments: step to pattern leading w/ LLE, discontinuous RW management, reduced knee ROM throughout all phases   [facility] Adult PT Treatment:                                                DATE: 12/27/23 Therapeutic Exercise: Recumbent bike , partial revolutions fwd/back, working into full revolutions within tolerance for ROM during subjective  SLR 2x15 LLE cues for full knee extension Quad set + heel elevation for end range ext, 2x10 cues for quad contraction Heel/toe raises in standing w/ UE support; 2x8 cues for form  HEP update/education + handout (concurrent w/ vaso)  Neuromuscular re-ed: Matthews step (1) fwd step up LLE 3x6; cues to  mitigate quad avoidance, quad activation, postural stability; inc rest breaks to ensure good performance  SPC ambulation 3 laps around gym; cues for change of pace, posture, motor control LLE  Modalities: Vaso (Lt) knee, 34 degrees, low compression x 10 min; LE elevated     [facility] Adult PT Treatment:                                                DATE: 12/25/23 Therapeutic Exercise: Standing heel raises 2x12 in walker  Supine pball hamstring curl, BIL LE 2x8 cues for form and comfortable  HEP discussion/education  Neuromuscular re-ed: Quad set propped on bosu, 2x12 superset w/ below (second round w/ strap for DF)  Bosu TKE push 2x12 superset w/ above SPC ambulation; 4x10ft, emphasis on placement and motor control of surgical limb   Therapeutic Activity: Ambulation w/ RW, 3x58ft with exaggerated knee ROM  Modalities: Vaso (Lt) knee, 34 degrees, low compression x 10 min; LE elevated    [facility] Adult PT Treatment:                                                DATE: 12/21/23  Neuromuscular re-ed: Bosu tke push 2x12 LLE cues for quad contraction Prone unresisted HS curl 2x8 LLE Fwd/retro walking along counter 4 laps, RUE support PRN CGA-SBA    Therapeutic Activity: Nu step L3 6 min LE/UE during subjective Weight shift onto standard step, UE support x12 cues for weight shifting  Fwd step up, 1 stack step; UE support 2x8 cues for mechanics  Education re: activity modification, positioning, edema management HEP update + education  Modalities: Vaso (Lt) knee, 34 degrees, low compression x 10 min; LE elevated                                                                                                           PATIENT EDUCATION:  Education details: Updated HEP Person educated: Patient Education method: Explanation, Demonstration, Tactile cues, Verbal cues Education comprehension: verbalized understanding, returned demonstration, verbal cues required, tactile cues required, and  needs further education    HOME EXERCISE PROGRAM: Access Code: [email or web address] URL: [email or web address] Date: 12/27/2023 Prepared by: Alm Jenny  Exercises - Supine Knee Extension Strengthening  - 2-3 x daily - 1 sets - 10-15 reps - 5 sec hold - Small Range Straight Leg Raise  - 2-3 x daily - 1 sets - 10 reps - 3-5 sec hold - Supine Heel Slide with Strap  - 2-3 x daily - 1 sets - 10 reps - Standing Knee Flexion Stretch on Step  - 2-3 x daily - 1 sets - 8 reps - Prone Knee Flexion  - 2-3 x daily - 1 sets - 10 reps - Heel Toe Raises with Counter Support  - 2-3 x daily - 1 sets - 10 reps - Mini Squat with Counter Support  - 2-3 x daily - 1 sets - 8 reps  ASSESSMENT:  CLINICAL IMPRESSION: 12/27/2023: Pt arrives w/o pain, continuing to endorse steady progress. AROM improving to 114deg, able to begin on recumbent bike today. Continuing to work on quad strengthening in open and closed chain, postural stability, and end range knee ext. Does well with postural stability training using SPC, discussed gradually beginning to utilize at home. Tolerates well overall, most difficulty w/ end range extension work. No adverse events. Recommend continuing along current POC in order to address relevant deficits and improve functional tolerance.  Pt departs today's session in no acute distress, all voiced questions/concerns addressed appropriately from PT perspective.      EVAL: Patient is a pleasant 73 y.o. gentleman who was seen today for physical therapy evaluation and treatment for L TKA DOS 8/20. Pt endorses difficulty w/ ADLs and functional mobility as expected for post surgical status. No red flags today. On exam he demonstrates concordant deficits in L knee ROM, altered gait/transfer mechanics, post op edema. TUG is indicative of reduced mobility and fall risk. Tolerates exam/HEP well overall, no adverse events, self care education as above. Recommend skilled PT to address aforementioned  deficits with aim of improving functional tolerance and reducing pain with typical activities. Pt departs today's session in no acute distress, all voiced concerns/questions addressed appropriately from PT perspective.     OBJECTIVE IMPAIRMENTS: Abnormal gait, decreased activity tolerance, decreased balance, decreased endurance, decreased mobility, difficulty walking, decreased ROM, decreased strength, increased edema, impaired perceived functional ability, improper body mechanics, and pain.   ACTIVITY LIMITATIONS: carrying, lifting, sitting, standing, squatting, stairs, transfers, and locomotion level  PARTICIPATION LIMITATIONS: meal prep, cleaning, laundry, and community activity  PERSONAL FACTORS: Age, Time since onset of injury/illness/exacerbation, and 1-2 comorbidities: GERD, HTN are also affecting patient's functional outcome.   REHAB POTENTIAL: Good  CLINICAL DECISION MAKING: Stable/uncomplicated  EVALUATION COMPLEXITY: Low   GOALS:  SHORT TERM GOALS: Target date: 01/04/2024  Pt will demonstrate appropriate understanding and performance of initially prescribed HEP in order to facilitate improved independence with management of symptoms.  Baseline: HEP established  Goal status: INITIAL   2. Pt will report at least 25% improvement in overall pain levels over past week in order to facilitate improved tolerance to typical daily activities.   Baseline: 3-8/10  Goal status: INITIAL   LONG TERM GOALS: Target date: 02/01/2024   Pt will score 45 or greater on LEFS in order to demonstrate improved perception of function due to symptoms (MCID 9 pts) Baseline: 6/80 Goal status: INITIAL  2.  Pt will demonstrate at least 0-110 degrees of knee AROM on surgical limb in order to facilitate improved tolerance to functional movements such as squatting, walking, and stair navigation.  Baseline: see ROM chart above Goal status: INITIAL  3.  Pt will demonstrate appropriate performance of  final prescribed HEP in order to facilitate improved self-management of symptoms post-discharge.   Baseline: initial HEP prescribed   Goal status: INITIAL    4.  Pt will be able to perform TUG in less than or equal to 13 sec in order to indicate reduced risk of falling (cutoff score for fall risk 13.5 sec in community dwelling older adults per [facility] et al, 2000)  Baseline: 25sec RW  Goal status: INITIAL    5. Pt will report at least 50% decrease in overall pain levels in past week in order to facilitate improved tolerance to basic ADLs/mobility.   Baseline: 3-8/10  Goal status: INITIAL    6. Pt will demonstrate symmetrical knee MMT between surgical and non-surgical limb in order to facilitate improved functional strength and mechanics.  Baseline: deferred on eval given proximity to surgery  Goal status: INITIAL     PLAN:  PT FREQUENCY: 2x/week  PT DURATION: 8 weeks  PLANNED INTERVENTIONS: 97164- PT Re-evaluation, 97750- Physical Performance Testing, 97110-Therapeutic exercises, 97530- Therapeutic activity, [id number]- Neuromuscular re-education, 97535- Self Care, [id number]- Manual therapy, [id number]- Gait training, [phone]- Electrical stimulation (unattended), 97016- Vasopneumatic device, 20560 (1-2 muscles), 20561 (3+ muscles)- Dry Needling, Patient/Family education, Balance training, Stair training,  Taping, Joint mobilization, Scar mobilization, Cryotherapy, and Moist heat  PLAN FOR NEXT SESSION: Review/update HEP PRN. Work on [employer] exercises as appropriate with emphasis on quad activation, knee mobility, functional mechanics, edema management. Gait training,  knee flexion on swing phase; progress AD as able. Symptom modification strategies as indicated/appropriate.    Alm DELENA Jenny PT, DPT 12/27/2023 2:05 PM

## 2023-12-28 NOTE — Therapy (Signed)
 OUTPATIENT PHYSICAL THERAPY LOWER EXTREMITY TREATMENT   Patient Name: Francisco Dixon MRN: [id number] DOB:30-Oct-1950, 73 y.o., male Today's Date: 12/31/2023  END OF SESSION:  PT End of Session - 12/31/23 1101     Visit Number 8    Number of Visits 17    Date for PT Re-Evaluation 02/01/24    Authorization Type medicare    Progress Note Due on Visit 10    PT Start Time 1101    PT Stop Time 1152   vaso   PT Time Calculation (min) 51 min               Past Medical History:  Diagnosis Date   COVID-19 (12/16/2020) 12/17/2020   GERD (gastroesophageal reflux disease)    Hypercholesterolemia    Hypertension    labile    Plantar fasciitis, right 12/07/2016   Vitamin D  deficiency    Past Surgical History:  Procedure Laterality Date   COLONOSCOPY N/A 2002,2007,2012   ROTATOR CUFF REPAIR Right 2015   Patient Active Problem List   Diagnosis Date Noted   Primary osteoarthritis of left knee 07/13/2023   Hearing loss 07/09/2023   Hemangioma flammeus 07/09/2023   IFG (impaired fasting glucose) 07/09/2023   B12 deficiency 11/12/2018   Numbness and tingling of both feet 11/27/2017   Major depression in full remission (HCC) 07/09/2017   Medication management 07/07/2017   Abnormal glucose 07/07/2017   Allergy 07/07/2017   Labile hypertension 02/26/2016   Hyperlipidemia, mixed 02/26/2016   Vitamin D  deficiency 02/26/2016    PCP: Alvan Dorothyann BIRCH, MD  REFERRING PROVIDER: Yvone Rush, MD  REFERRING DIAG: [id number] (ICD-10-CM) - History of total left knee replacement (TKR)  THERAPY DIAG:  Left knee pain, unspecified chronicity  Stiffness of left knee, not elsewhere classified  Other abnormalities of gait and mobility  Localized edema  Rationale for Evaluation and Treatment: Rehabilitation  ONSET DATE: s/p L TKA DOS 8/20   SUBJECTIVE:   SUBJECTIVE STATEMENT: 12/31/2023: overall still feeling improvement, but states he is a bit discouraged after increased  pain last night, preceded by inc community navigation w/ cane. No issues after last session, symptoms back to baseline this morning. Arrives w RW    EVAL: Spouse currently assisting heavily with self care, housework. Has been working on exercises since surgery, feels pain but has been very adherent with them.  Prior to surgery pt was limited somewhat due to pain, but states he was doing yardwork, modified exercise classes 3x/week (stretching, aerobics, resistance training). Enjoys golfing, hiking, biking but was limited prior to surgery and using cane for a few weeks due to pain.  Is traveling to see daughter in beginning of October and wants to be active for that.   PERTINENT HISTORY: GERD, HTN PAIN:  Are you having pain: none  Per eval:  Location/description: L knee, anterior and posterior Best-worst: 3-8/10; will tend to settle quickly   - aggravating factors: transfers, walking, stairs, housework, stretching - Easing factors: icing, movement    PRECAUTIONS: None  RED FLAGS: None Reports some nausea/dizziness w/ medications, improving No calf pain, fevers/chills, SOB Reports good compliance w/ post op aspirin   WEIGHT BEARING RESTRICTIONS: No  FALLS:  Has patient fallen in last 6 months? Yes. Number of falls 1 fall, walking puppy, denies injury, denies issues with balance  LIVING ENVIRONMENT: 1 story; entry through basement , 13 steps 1 rail to main level; front entrance 3+4 STE 1 rail with sloped entry Lives w/ wife who is also  retired  OCCUPATION: retired - was a Building services engineer for 40 years in [place]    PLOF: Independent  PATIENT GOALS: take trip to see daughter, wants to get back to normal and be more active again (hiking, golfing, biking)   NEXT MD VISIT: October   OBJECTIVE:  Note: Objective measures were completed at Evaluation unless otherwise noted.  DIAGNOSTIC FINDINGS:  S/p L TKA DOS 8/20   PATIENT SURVEYS:  LEFS: 6/80  COGNITION: Overall cognitive  status: Within functional limits for tasks assessed     EDEMA/INSPECTION:  Incision obscured by dressing and compression stocking, no calf pain or swelling, no TTP about calf, no pain w/ DF; no visible drainage   LOWER EXTREMITY ROM:      Right eval Left eval Left 12/14/23 Left 12/19/23 Left 12/27/23 Left 12/31/23  Hip flexion        Hip extension        Hip internal rotation        Hip external rotation        Knee extension  A: lacking 8 deg w/ towel under calf  AROM 5 deg AROM 5 deg  AROM lacking 3 deg  Knee flexion  P: 70-72 deg AAROM 90 deg AAROM 101 deg AAROM 109 on bike  AROM supine 114 deg AROM 117 deg supine  (Blank rows = not tested) (Key: WFL = within functional limits not formally assessed, * = concordant pain, s = stiffness/stretching sensation, NT = not tested)  Comments:    LOWER EXTREMITY MMT:    MMT Right eval Left eval  Hip flexion    Hip abduction (modified sitting)    Hip internal rotation    Hip external rotation    Knee flexion    Knee extension    Ankle dorsiflexion     (Blank rows = not tested) (Key: WFL = within functional limits not formally assessed, * = concordant pain, s = stiffness/stretching sensation, NT = not tested)  Comments: deferred on eval given acuity of surgery   FUNCTIONAL TESTS:  TUG: 25.13 w RW; weight shift to RLE during transfer, BIL UE from RW  GAIT: Distance walked: within clinic Assistive device utilized: Environmental consultant - 2 wheeled Level of assistance: Modified independence Comments: step to pattern leading w/ LLE, discontinuous RW management, reduced knee ROM throughout all phases    [facility] Adult PT Treatment:                                                DATE: 12/31/23 Therapeutic Exercise: 6 min recumbent bike full revolutions during subjective  Prone TKE 5 sec hold x10 cues for positioning/alignment Prone TKE w/ thigh prop (small towel) 5 sec hold x10  Propped calf stretch w strap + TKE LLE 2x8    Neuromuscular  re-ed: Stack step (1) fwd step up 2x8 cues for quad activation/stability Fwd/retro walking along counter top 4 laps cues for posture BW squat at counter 2x12 cues for symmetry and quad activation at top of movement  Self Care: Education/discussion re: symptom behavior, SPC vs RW and pros/cons of each, encouraged use of cane in simpler/familiar environments and RW in community, monitoring symptom response and modifying accordingly  Modalities: Vaso (Lt) knee, 34 degrees, low compression x 10 min; LE elevated    [facility] Adult PT Treatment:  DATE: 12/27/23 Therapeutic Exercise: Recumbent bike , partial revolutions fwd/back, working into full revolutions within tolerance for ROM during subjective  SLR 2x15 LLE cues for full knee extension Quad set + heel elevation for end range ext, 2x10 cues for quad contraction Heel/toe raises in standing w/ UE support; 2x8 cues for form  HEP update/education + handout (concurrent w/ vaso)  Neuromuscular re-ed: Matthews step (1) fwd step up LLE 3x6; cues to mitigate quad avoidance, quad activation, postural stability; inc rest breaks to ensure good performance  SPC ambulation 3 laps around gym; cues for change of pace, posture, motor control LLE  Modalities: Vaso (Lt) knee, 34 degrees, low compression x 10 min; LE elevated     [facility] Adult PT Treatment:                                                DATE: 12/25/23 Therapeutic Exercise: Standing heel raises 2x12 in walker  Supine pball hamstring curl, BIL LE 2x8 cues for form and comfortable  HEP discussion/education  Neuromuscular re-ed: Quad set propped on bosu, 2x12 superset w/ below (second round w/ strap for DF)  Bosu TKE push 2x12 superset w/ above SPC ambulation; 4x10ft, emphasis on placement and motor control of surgical limb   Therapeutic Activity: Ambulation w/ RW, 3x92ft with exaggerated knee ROM  Modalities: Vaso (Lt) knee, 34 degrees, low  compression x 10 min; LE elevated                                                                       PATIENT EDUCATION:  Education details: Updated HEP Person educated: Patient Education method: Explanation, Demonstration, Tactile cues, Verbal cues Education comprehension: verbalized understanding, returned demonstration, verbal cues required, tactile cues required, and needs further education    HOME EXERCISE PROGRAM: Access Code: [email or web address] URL: [email or web address] Date: 12/27/2023 Prepared by: Alm Jenny  Exercises - Supine Knee Extension Strengthening  - 2-3 x daily - 1 sets - 10-15 reps - 5 sec hold - Small Range Straight Leg Raise  - 2-3 x daily - 1 sets - 10 reps - 3-5 sec hold - Supine Heel Slide with Strap  - 2-3 x daily - 1 sets - 10 reps - Standing Knee Flexion Stretch on Step  - 2-3 x daily - 1 sets - 8 reps - Prone Knee Flexion  - 2-3 x daily - 1 sets - 10 reps - Heel Toe Raises with Counter Support  - 2-3 x daily - 1 sets - 10 reps - Mini Squat with Counter Support  - 2-3 x daily - 1 sets - 8 reps  ASSESSMENT:  CLINICAL IMPRESSION: 12/31/2023: Pt arrives w/o pain but does endorse some increased pain last night after community navigation w/ cane. Today continuing to work on end range knee extension, end range knee flexion, and closed chain functional stability. Tolerates session well, AROM continues to improve steadily. No adverse events, vaso at end of session. Also educated on weaning from RW to [facility] and strategies to improve comfort/safety, indications for RW vs SPC. Recommend continuing along current POC in order to address relevant deficits  and improve functional tolerance. Pt departs today's session in no acute distress, all voiced questions/concerns addressed appropriately from PT perspective.     EVAL: Patient is a pleasant 73 y.o. gentleman who was seen today for physical therapy evaluation and treatment for L TKA DOS 8/20. Pt endorses difficulty  w/ ADLs and functional mobility as expected for post surgical status. No red flags today. On exam he demonstrates concordant deficits in L knee ROM, altered gait/transfer mechanics, post op edema. TUG is indicative of reduced mobility and fall risk. Tolerates exam/HEP well overall, no adverse events, self care education as above. Recommend skilled PT to address aforementioned deficits with aim of improving functional tolerance and reducing pain with typical activities. Pt departs today's session in no acute distress, all voiced concerns/questions addressed appropriately from PT perspective.     OBJECTIVE IMPAIRMENTS: Abnormal gait, decreased activity tolerance, decreased balance, decreased endurance, decreased mobility, difficulty walking, decreased ROM, decreased strength, increased edema, impaired perceived functional ability, improper body mechanics, and pain.   ACTIVITY LIMITATIONS: carrying, lifting, sitting, standing, squatting, stairs, transfers, and locomotion level  PARTICIPATION LIMITATIONS: meal prep, cleaning, laundry, and community activity  PERSONAL FACTORS: Age, Time since onset of injury/illness/exacerbation, and 1-2 comorbidities: GERD, HTN are also affecting patient's functional outcome.   REHAB POTENTIAL: Good  CLINICAL DECISION MAKING: Stable/uncomplicated  EVALUATION COMPLEXITY: Low   GOALS:  SHORT TERM GOALS: Target date: 01/04/2024  Pt will demonstrate appropriate understanding and performance of initially prescribed HEP in order to facilitate improved independence with management of symptoms.  Baseline: HEP established  Goal status: INITIAL   2. Pt will report at least 25% improvement in overall pain levels over past week in order to facilitate improved tolerance to typical daily activities.   Baseline: 3-8/10  Goal status: INITIAL   LONG TERM GOALS: Target date: 02/01/2024   Pt will score 45 or greater on LEFS in order to demonstrate improved perception of  function due to symptoms (MCID 9 pts) Baseline: 6/80 Goal status: INITIAL  2.  Pt will demonstrate at least 0-110 degrees of knee AROM on surgical limb in order to facilitate improved tolerance to functional movements such as squatting, walking, and stair navigation.  Baseline: see ROM chart above Goal status: INITIAL  3.  Pt will demonstrate appropriate performance of final prescribed HEP in order to facilitate improved self-management of symptoms post-discharge.   Baseline: initial HEP prescribed   Goal status: INITIAL    4.  Pt will be able to perform TUG in less than or equal to 13 sec in order to indicate reduced risk of falling (cutoff score for fall risk 13.5 sec in community dwelling older adults per [facility] et al, 2000)  Baseline: 25sec RW  Goal status: INITIAL    5. Pt will report at least 50% decrease in overall pain levels in past week in order to facilitate improved tolerance to basic ADLs/mobility.   Baseline: 3-8/10  Goal status: INITIAL    6. Pt will demonstrate symmetrical knee MMT between surgical and non-surgical limb in order to facilitate improved functional strength and mechanics.  Baseline: deferred on eval given proximity to surgery  Goal status: INITIAL     PLAN:  PT FREQUENCY: 2x/week  PT DURATION: 8 weeks  PLANNED INTERVENTIONS: 97164- PT Re-evaluation, 97750- Physical Performance Testing, 97110-Therapeutic exercises, 97530- Therapeutic activity, [id number]- Neuromuscular re-education, 97535- Self Care, [id number]- Manual therapy, [id number]- Gait training, [id number]- Electrical stimulation (unattended), 97016- Vasopneumatic device, 20560 (1-2 muscles), 20561 (3+ muscles)- Dry Needling, Patient/Family education, Balance  training, Stair training, Taping, Joint mobilization, Scar mobilization, Cryotherapy, and Moist heat  PLAN FOR NEXT SESSION: Review/update HEP PRN. Work on [employer] exercises as appropriate with emphasis on quad activation, knee mobility, functional  mechanics, edema management. Gait training,  knee flexion on swing phase; progress AD as able. Symptom modification strategies as indicated/appropriate.    Alm DELENA Jenny PT, DPT 12/31/2023 12:02 PM

## 2023-12-31 ENCOUNTER — Encounter: Payer: Self-pay | Admitting: Physical Therapy

## 2023-12-31 ENCOUNTER — Ambulatory Visit: Admitting: Physical Therapy

## 2023-12-31 DIAGNOSIS — M25662 Stiffness of left knee, not elsewhere classified: Secondary | ICD-10-CM

## 2023-12-31 DIAGNOSIS — R600 Localized edema: Secondary | ICD-10-CM | POA: Diagnosis not present

## 2023-12-31 DIAGNOSIS — M25562 Pain in left knee: Secondary | ICD-10-CM | POA: Diagnosis not present

## 2023-12-31 DIAGNOSIS — R2689 Other abnormalities of gait and mobility: Secondary | ICD-10-CM

## 2024-01-02 ENCOUNTER — Encounter: Payer: Self-pay | Admitting: Physical Therapy

## 2024-01-02 ENCOUNTER — Ambulatory Visit: Admitting: Physical Therapy

## 2024-01-02 DIAGNOSIS — M25662 Stiffness of left knee, not elsewhere classified: Secondary | ICD-10-CM

## 2024-01-02 DIAGNOSIS — R2689 Other abnormalities of gait and mobility: Secondary | ICD-10-CM | POA: Diagnosis not present

## 2024-01-02 DIAGNOSIS — R600 Localized edema: Secondary | ICD-10-CM | POA: Diagnosis not present

## 2024-01-02 DIAGNOSIS — M25562 Pain in left knee: Secondary | ICD-10-CM | POA: Diagnosis not present

## 2024-01-02 NOTE — Therapy (Signed)
 OUTPATIENT PHYSICAL THERAPY LOWER EXTREMITY TREATMENT   Patient Name: Francisco Dixon MRN: [id number] DOB:1950/11/03, 73 y.o., male Today's Date: 01/02/2024  END OF SESSION:  PT End of Session - 01/02/24 1020     Visit Number 9    Number of Visits 17    Date for PT Re-Evaluation 02/01/24    Authorization Type medicare    Progress Note Due on Visit 10    PT Start Time 1020    PT Stop Time 1104    PT Time Calculation (min) 44 min                Past Medical History:  Diagnosis Date   COVID-19 (12/16/2020) 12/17/2020   GERD (gastroesophageal reflux disease)    Hypercholesterolemia    Hypertension    labile    Plantar fasciitis, right 12/07/2016   Vitamin D  deficiency    Past Surgical History:  Procedure Laterality Date   COLONOSCOPY N/A 2002,2007,2012   ROTATOR CUFF REPAIR Right 2015   Patient Active Problem List   Diagnosis Date Noted   Primary osteoarthritis of left knee 07/13/2023   Hearing loss 07/09/2023   Hemangioma flammeus 07/09/2023   IFG (impaired fasting glucose) 07/09/2023   B12 deficiency 11/12/2018   Numbness and tingling of both feet 11/27/2017   Major depression in full remission (HCC) 07/09/2017   Medication management 07/07/2017   Abnormal glucose 07/07/2017   Allergy 07/07/2017   Labile hypertension 02/26/2016   Hyperlipidemia, mixed 02/26/2016   Vitamin D  deficiency 02/26/2016    PCP: Alvan Dorothyann BIRCH, MD  REFERRING PROVIDER: Yvone Rush, MD  REFERRING DIAG: [phone] (ICD-10-CM) - History of total left knee replacement (TKR)  THERAPY DIAG:  Left knee pain, unspecified chronicity  Stiffness of left knee, not elsewhere classified  Other abnormalities of gait and mobility  Localized edema  Rationale for Evaluation and Treatment: Rehabilitation  ONSET DATE: s/p L TKA DOS 8/20   SUBJECTIVE:   SUBJECTIVE STATEMENT: 01/02/2024: states he got sore a couple hours after last session but cleared up by the end of the day. Sleep  is improving, not getting woken up as much. Does still have increased pain as he is doing more. No pain at present.     EVAL: Spouse currently assisting heavily with self care, housework. Has been working on exercises since surgery, feels pain but has been very adherent with them.  Prior to surgery pt was limited somewhat due to pain, but states he was doing yardwork, modified exercise classes 3x/week (stretching, aerobics, resistance training). Enjoys golfing, hiking, biking but was limited prior to surgery and using cane for a few weeks due to pain.  Is traveling to see daughter in beginning of October and wants to be active for that.   PERTINENT HISTORY: GERD, HTN PAIN:  Are you having pain: none  Per eval:  Location/description: L knee, anterior and posterior Best-worst: 3-8/10; will tend to settle quickly   - aggravating factors: transfers, walking, stairs, housework, stretching - Easing factors: icing, movement    PRECAUTIONS: None  RED FLAGS: None Reports some nausea/dizziness w/ medications, improving No calf pain, fevers/chills, SOB Reports good compliance w/ post op aspirin   WEIGHT BEARING RESTRICTIONS: No  FALLS:  Has patient fallen in last 6 months? Yes. Number of falls 1 fall, walking puppy, denies injury, denies issues with balance  LIVING ENVIRONMENT: 1 story; entry through basement , 13 steps 1 rail to main level; front entrance 3+4 STE 1 rail with sloped entry Lives w/  wife who is also retired  OCCUPATION: retired - was a Building services engineer for 40 years in [place]    PLOF: Independent  PATIENT GOALS: take trip to see daughter, wants to get back to normal and be more active again (hiking, golfing, biking)   NEXT MD VISIT: Sept 23rd  OBJECTIVE:  Note: Objective measures were completed at Evaluation unless otherwise noted.  DIAGNOSTIC FINDINGS:  S/p L TKA DOS 8/20   PATIENT SURVEYS:  LEFS: 6/80  COGNITION: Overall cognitive status: Within functional  limits for tasks assessed     EDEMA/INSPECTION:  Incision obscured by dressing and compression stocking, no calf pain or swelling, no TTP about calf, no pain w/ DF; no visible drainage   LOWER EXTREMITY ROM:      Right eval Left eval Left 12/14/23 Left 12/19/23 Left 12/27/23 Left 12/31/23  Hip flexion        Hip extension        Hip internal rotation        Hip external rotation        Knee extension  A: lacking 8 deg w/ towel under calf  AROM 5 deg AROM 5 deg  AROM lacking 3 deg  Knee flexion  P: 70-72 deg AAROM 90 deg AAROM 101 deg AAROM 109 on bike  AROM supine 114 deg AROM 117 deg supine  (Blank rows = not tested) (Key: WFL = within functional limits not formally assessed, * = concordant pain, s = stiffness/stretching sensation, NT = not tested)  Comments:    LOWER EXTREMITY MMT:    MMT Right eval Left eval  Hip flexion    Hip abduction (modified sitting)    Hip internal rotation    Hip external rotation    Knee flexion    Knee extension    Ankle dorsiflexion     (Blank rows = not tested) (Key: WFL = within functional limits not formally assessed, * = concordant pain, s = stiffness/stretching sensation, NT = not tested)  Comments: deferred on eval given acuity of surgery   FUNCTIONAL TESTS:  TUG: 25.13 w RW; weight shift to RLE during transfer, BIL UE from RW  GAIT: Distance walked: within clinic Assistive device utilized: Environmental consultant - 2 wheeled Level of assistance: Modified independence Comments: step to pattern leading w/ LLE, discontinuous RW management, reduced knee ROM throughout all phases    [facility] Adult PT Treatment:                                                DATE: 01/02/24 Therapeutic Exercise: Prone TKE w/ thigh prop 2x10 cues for setup and reduced hip compensations Propped calf stretch + TKE 2x12  Standing RB hip abduction 2x12 cues for form and posture HEP update + education/handout   Therapeutic Activity: Nu step L4 LE/UE 6 min during  subjective  2 inch fwd step up + RB TKE 2x15  Wall squat w/ pball x12 cues for symmetry  Education re: functional mobility and activity modification    [facility] Adult PT Treatment:                                                DATE: 12/31/23 Therapeutic Exercise: 6 min recumbent bike full revolutions  during subjective  Prone TKE 5 sec hold x10 cues for positioning/alignment Prone TKE w/ thigh prop (small towel) 5 sec hold x10  Propped calf stretch w strap + TKE LLE 2x8    Neuromuscular re-ed: Stack step (1) fwd step up 2x8 cues for quad activation/stability Fwd/retro walking along counter top 4 laps cues for posture BW squat at counter 2x12 cues for symmetry and quad activation at top of movement  Self Care: Education/discussion re: symptom behavior, SPC vs RW and pros/cons of each, encouraged use of cane in simpler/familiar environments and RW in community, monitoring symptom response and modifying accordingly  Modalities: Vaso (Lt) knee, 34 degrees, low compression x 10 min; LE elevated    [facility] Adult PT Treatment:                                                DATE: 12/27/23 Therapeutic Exercise: Recumbent bike , partial revolutions fwd/back, working into full revolutions within tolerance for ROM during subjective  SLR 2x15 LLE cues for full knee extension Quad set + heel elevation for end range ext, 2x10 cues for quad contraction Heel/toe raises in standing w/ UE support; 2x8 cues for form  HEP update/education + handout (concurrent w/ vaso)  Neuromuscular re-ed: Matthews step (1) fwd step up LLE 3x6; cues to mitigate quad avoidance, quad activation, postural stability; inc rest breaks to ensure good performance  SPC ambulation 3 laps around gym; cues for change of pace, posture, motor control LLE  Modalities: Vaso (Lt) knee, 34 degrees, low compression x 10 min; LE elevated     [facility] Adult PT Treatment:                                                DATE:  12/25/23 Therapeutic Exercise: Standing heel raises 2x12 in walker  Supine pball hamstring curl, BIL LE 2x8 cues for form and comfortable  HEP discussion/education  Neuromuscular re-ed: Quad set propped on bosu, 2x12 superset w/ below (second round w/ strap for DF)  Bosu TKE push 2x12 superset w/ above SPC ambulation; 4x11ft, emphasis on placement and motor control of surgical limb   Therapeutic Activity: Ambulation w/ RW, 3x16ft with exaggerated knee ROM  Modalities: Vaso (Lt) knee, 34 degrees, low compression x 10 min; LE elevated                                                                       PATIENT EDUCATION:  Education details: Updated HEP Person educated: Patient Education method: Explanation, Demonstration, Tactile cues, Verbal cues Education comprehension: verbalized understanding, returned demonstration, verbal cues required, tactile cues required, and needs further education    HOME EXERCISE PROGRAM: Access Code: [email or web address] URL: [email or web address] Date: 01/02/2024 Prepared by: Alm Jenny  Exercises - Small Range Straight Leg Raise  - 2-3 x daily - 1 sets - 10 reps - 3-5 sec hold - Supine Heel Slide with Strap  - 2-3 x daily - 1 sets - 10 reps -  Standing Knee Flexion Stretch on Step  - 2-3 x daily - 1 sets - 8 reps - Prone Knee Flexion  - 2-3 x daily - 1 sets - 10 reps - Mini Squat with Counter Support  - 2-3 x daily - 1 sets - 8 reps - Seated Quad Set  - 2-3 x daily - 1 sets - 10 reps - Prone Terminal Knee Extension  - 2-3 x daily - 1 sets - 10 reps  ASSESSMENT:  CLINICAL IMPRESSION: 01/02/2024: Pt arrives w/o pain but does endorse some soreness for remainder of day after last session. Today continuing to progress mobility work with emphasis on end range extension, also continuing to work on quad activation and Dance movement psychotherapist. No adverse events, tolerates session well overall with noted improvement in tolerance to end range extension  activities. Reports fatigue but no increase in pain on departure. Recommend continuing along current POC in order to address relevant deficits and improve functional tolerance. Pt departs today's session in no acute distress, all voiced questions/concerns addressed appropriately from PT perspective.      EVAL: Patient is a pleasant 73 y.o. gentleman who was seen today for physical therapy evaluation and treatment for L TKA DOS 8/20. Pt endorses difficulty w/ ADLs and functional mobility as expected for post surgical status. No red flags today. On exam he demonstrates concordant deficits in L knee ROM, altered gait/transfer mechanics, post op edema. TUG is indicative of reduced mobility and fall risk. Tolerates exam/HEP well overall, no adverse events, self care education as above. Recommend skilled PT to address aforementioned deficits with aim of improving functional tolerance and reducing pain with typical activities. Pt departs today's session in no acute distress, all voiced concerns/questions addressed appropriately from PT perspective.     OBJECTIVE IMPAIRMENTS: Abnormal gait, decreased activity tolerance, decreased balance, decreased endurance, decreased mobility, difficulty walking, decreased ROM, decreased strength, increased edema, impaired perceived functional ability, improper body mechanics, and pain.   ACTIVITY LIMITATIONS: carrying, lifting, sitting, standing, squatting, stairs, transfers, and locomotion level  PARTICIPATION LIMITATIONS: meal prep, cleaning, laundry, and community activity  PERSONAL FACTORS: Age, Time since onset of injury/illness/exacerbation, and 1-2 comorbidities: GERD, HTN are also affecting patient's functional outcome.   REHAB POTENTIAL: Good  CLINICAL DECISION MAKING: Stable/uncomplicated  EVALUATION COMPLEXITY: Low   GOALS:  SHORT TERM GOALS: Target date: 01/04/2024  Pt will demonstrate appropriate understanding and performance of initially prescribed  HEP in order to facilitate improved independence with management of symptoms.  Baseline: HEP established  01/02/24: reports good HEP performance Goal status: MET  2. Pt will report at least 25% improvement in overall pain levels over past week in order to facilitate improved tolerance to typical daily activities.   Baseline: 3-8/10 Goal status: ONGOING  LONG TERM GOALS: Target date: 02/01/2024   Pt will score 45 or greater on LEFS in order to demonstrate improved perception of function due to symptoms (MCID 9 pts) Baseline: 6/80 Goal status: INITIAL  2.  Pt will demonstrate at least 0-110 degrees of knee AROM on surgical limb in order to facilitate improved tolerance to functional movements such as squatting, walking, and stair navigation.  Baseline: see ROM chart above Goal status: INITIAL  3.  Pt will demonstrate appropriate performance of final prescribed HEP in order to facilitate improved self-management of symptoms post-discharge.   Baseline: initial HEP prescribed   Goal status: INITIAL    4.  Pt will be able to perform TUG in less than or equal to 13 sec  in order to indicate reduced risk of falling (cutoff score for fall risk 13.5 sec in community dwelling older adults per [facility] et al, 2000)  Baseline: 25sec RW  Goal status: INITIAL    5. Pt will report at least 50% decrease in overall pain levels in past week in order to facilitate improved tolerance to basic ADLs/mobility.   Baseline: 3-8/10  Goal status: INITIAL    6. Pt will demonstrate symmetrical knee MMT between surgical and non-surgical limb in order to facilitate improved functional strength and mechanics.  Baseline: deferred on eval given proximity to surgery  Goal status: INITIAL     PLAN:  PT FREQUENCY: 2x/week  PT DURATION: 8 weeks  PLANNED INTERVENTIONS: 97164- PT Re-evaluation, 97750- Physical Performance Testing, 97110-Therapeutic exercises, 97530- Therapeutic activity, [id number]- Neuromuscular  re-education, 97535- Self Care, [id number]- Manual therapy, [id number]- Gait training, [id number]- Electrical stimulation (unattended), 97016- Vasopneumatic device, 20560 (1-2 muscles), 20561 (3+ muscles)- Dry Needling, Patient/Family education, Balance training, Stair training, Taping, Joint mobilization, Scar mobilization, Cryotherapy, and Moist heat  PLAN FOR NEXT SESSION: Review/update HEP PRN. Work on [employer] exercises as appropriate with emphasis on quad activation, knee mobility, functional mechanics, edema management. Gait training,  knee flexion on swing phase; progress AD as able. Symptom modification strategies as indicated/appropriate.    Alm DELENA Jenny PT, DPT 01/02/2024 11:08 AM

## 2024-01-07 ENCOUNTER — Encounter: Payer: Self-pay | Admitting: Physical Therapy

## 2024-01-07 ENCOUNTER — Ambulatory Visit: Admitting: Physical Therapy

## 2024-01-07 DIAGNOSIS — M25662 Stiffness of left knee, not elsewhere classified: Secondary | ICD-10-CM | POA: Diagnosis not present

## 2024-01-07 DIAGNOSIS — M25562 Pain in left knee: Secondary | ICD-10-CM

## 2024-01-07 DIAGNOSIS — R600 Localized edema: Secondary | ICD-10-CM

## 2024-01-07 DIAGNOSIS — R2689 Other abnormalities of gait and mobility: Secondary | ICD-10-CM | POA: Diagnosis not present

## 2024-01-07 NOTE — Therapy (Signed)
 OUTPATIENT PHYSICAL THERAPY LOWER EXTREMITY TREATMENT + PROGRESS NOTE   Patient Name: Francisco Dixon MRN: [id number] DOB:Jun 06, 1950, 73 y.o., male Today's Date: 01/07/2024  Progress Note Reporting Period 12/07/23 to 01/07/24  See note below for Objective Data and Assessment of Progress/Goals.      END OF SESSION:  PT End of Session - 01/07/24 1015     Visit Number 10    Number of Visits 17    Date for Recertification  02/01/24    Authorization Type medicare    Progress Note Due on Visit 20    PT Start Time 1015    PT Stop Time 1100    PT Time Calculation (min) 45 min            Past Medical History:  Diagnosis Date   COVID-19 (12/16/2020) 12/17/2020   GERD (gastroesophageal reflux disease)    Hypercholesterolemia    Hypertension    labile    Plantar fasciitis, right 12/07/2016   Vitamin D  deficiency    Past Surgical History:  Procedure Laterality Date   COLONOSCOPY N/A 2002,2007,2012   ROTATOR CUFF REPAIR Right 2015   Patient Active Problem List   Diagnosis Date Noted   Primary osteoarthritis of left knee 07/13/2023   Hearing loss 07/09/2023   Hemangioma flammeus 07/09/2023   IFG (impaired fasting glucose) 07/09/2023   B12 deficiency 11/12/2018   Numbness and tingling of both feet 11/27/2017   Major depression in full remission (HCC) 07/09/2017   Medication management 07/07/2017   Abnormal glucose 07/07/2017   Allergy 07/07/2017   Labile hypertension 02/26/2016   Hyperlipidemia, mixed 02/26/2016   Vitamin D  deficiency 02/26/2016    PCP: Alvan Dorothyann BIRCH, MD  REFERRING PROVIDER: Yvone Rush, MD  REFERRING DIAG: [phone] (ICD-10-CM) - History of total left knee replacement (TKR)  THERAPY DIAG:  Left knee pain, unspecified chronicity  Stiffness of left knee, not elsewhere classified  Other abnormalities of gait and mobility  Localized edema  Rationale for Evaluation and Treatment: Rehabilitation  ONSET DATE: s/p L TKA DOS 8/20    SUBJECTIVE:   SUBJECTIVE STATEMENT: 01/07/2024: arrives w/ SPC, no pain at present. No more than 2/10 over the weekend but still bothers him with increased walking and stair navigation. States he took off bandage on Saturday, overall healing quite well but still scabbing on distal portion. Denies any odor, drainage, excessive redness, or constitutional symptoms. Sees surgeon tomorrow.    EVAL: Spouse currently assisting heavily with self care, housework. Has been working on exercises since surgery, feels pain but has been very adherent with them.  Prior to surgery pt was limited somewhat due to pain, but states he was doing yardwork, modified exercise classes 3x/week (stretching, aerobics, resistance training). Enjoys golfing, hiking, biking but was limited prior to surgery and using cane for a few weeks due to pain.  Is traveling to see daughter in beginning of October and wants to be active for that.   PERTINENT HISTORY: GERD, HTN PAIN:  Are you having pain: none  Per eval:  Location/description: L knee, anterior and posterior Best-worst: 3-8/10; will tend to settle quickly   - aggravating factors: transfers, walking, stairs, housework, stretching - Easing factors: icing, movement    PRECAUTIONS: None  RED FLAGS: None Reports some nausea/dizziness w/ medications, improving No calf pain, fevers/chills, SOB Reports good compliance w/ post op aspirin   WEIGHT BEARING RESTRICTIONS: No  FALLS:  Has patient fallen in last 6 months? Yes. Number of falls 1 fall, walking puppy, denies  injury, denies issues with balance  LIVING ENVIRONMENT: 1 story; entry through basement , 13 steps 1 rail to main level; front entrance 3+4 STE 1 rail with sloped entry Lives w/ wife who is also retired  OCCUPATION: retired - was a Building services engineer for 40 years in [place]    PLOF: Independent  PATIENT GOALS: take trip to see daughter, wants to get back to normal and be more active again (hiking,  golfing, biking)   NEXT MD VISIT: Sept 23rd  OBJECTIVE:  Note: Objective measures were completed at Evaluation unless otherwise noted.  DIAGNOSTIC FINDINGS:  S/p L TKA DOS 8/20   PATIENT SURVEYS:  LEFS: 6/80  01/07/24 LEFS 33/80   COGNITION: Overall cognitive status: Within functional limits for tasks assessed     EDEMA/INSPECTION:  Incision obscured by dressing and compression stocking, no calf pain or swelling, no TTP about calf, no pain w/ DF; no visible drainage  01/07/24: - incision well appearing overall, distally does have ~ 2.5 cm portion that is scabbed/reddish (remainder of incision pink and closed without scabbing) but no surrounding erythema or drainage. Pt states this has remained unchanged since removing bandage Saturday   LOWER EXTREMITY ROM:      Right eval Left eval Left 12/14/23 Left 12/19/23 Left 12/27/23 Left 12/31/23 Left 01/07/24  Hip flexion         Hip extension         Hip internal rotation         Hip external rotation         Knee extension  A: lacking 8 deg w/ towel under calf  AROM 5 deg AROM 5 deg  AROM lacking 3 deg AROM lacking 3-4 deg without prop   Knee flexion  P: 70-72 deg AAROM 90 deg AAROM 101 deg AAROM 109 on bike  AROM supine 114 deg AROM 117 deg supine AROM 118 deg supine   (Blank rows = not tested) (Key: WFL = within functional limits not formally assessed, * = concordant pain, s = stiffness/stretching sensation, NT = not tested)  Comments:    LOWER EXTREMITY MMT:    MMT Right eval Left eval  Hip flexion    Hip abduction (modified sitting)    Hip internal rotation    Hip external rotation    Knee flexion    Knee extension    Ankle dorsiflexion     (Blank rows = not tested) (Key: WFL = within functional limits not formally assessed, * = concordant pain, s = stiffness/stretching sensation, NT = not tested)  Comments: deferred on eval given acuity of surgery   FUNCTIONAL TESTS:  TUG: 25.13 w RW; weight shift to RLE  during transfer, BIL UE from RW   01/07/24: TUG 12.01sec no AD but significant alteration in kinematics; 10.56sec w SPC improved mechanics   GAIT: Distance walked: within clinic Assistive device utilized: Environmental consultant - 2 wheeled Level of assistance: Modified independence Comments: step to pattern leading w/ LLE, discontinuous RW management, reduced knee ROM throughout all phases    [facility] Adult PT Treatment:                                                DATE: 01/07/24  Neuromuscular re-ed: Bosu TKE push LLE 2x12 cues for quad activation Step up dynadisc LLE (TKE) 2x10 cues for quad activation  2 laps  around clinic w [facility] - emphasizing quad activation/stability in stance phase Retro walking x10 ft w SPC cues for end range extension  Therapeutic Activity: Recumbent bike 4 min during subjective for activity tolerance  MSK assessment + education TUG + education 5xSTS + education Education/discussion re: progress with PT, symptom behavior as it affects activity tolerance, PT goals/POC   Self Care: Education on close monitoring of incision, red flags and indications for emergent work up, communication w/ provider   [facility] Adult PT Treatment:                                                DATE: 01/02/24 Therapeutic Exercise: Prone TKE w/ thigh prop 2x10 cues for setup and reduced hip compensations Propped calf stretch + TKE 2x12  Standing RB hip abduction 2x12 cues for form and posture HEP update + education/handout   Therapeutic Activity: Nu step L4 LE/UE 6 min during subjective  2 inch fwd step up + RB TKE 2x15  Wall squat w/ pball x12 cues for symmetry  Education re: functional mobility and activity modification    [facility] Adult PT Treatment:                                                DATE: 12/31/23 Therapeutic Exercise: 6 min recumbent bike full revolutions during subjective  Prone TKE 5 sec hold x10 cues for positioning/alignment Prone TKE w/ thigh prop (small towel) 5 sec hold  x10  Propped calf stretch w strap + TKE LLE 2x8    Neuromuscular re-ed: Stack step (1) fwd step up 2x8 cues for quad activation/stability Fwd/retro walking along counter top 4 laps cues for posture BW squat at counter 2x12 cues for symmetry and quad activation at top of movement  Self Care: Education/discussion re: symptom behavior, SPC vs RW and pros/cons of each, encouraged use of cane in simpler/familiar environments and RW in community, monitoring symptom response and modifying accordingly  Modalities: Vaso (Lt) knee, 34 degrees, low compression x 10 min; LE elevated                                                                 PATIENT EDUCATION:  Education details: see above Person educated: Patient Education method: Explanation, Demonstration, Tactile cues, Verbal cues Education comprehension: verbalized understanding, returned demonstration, verbal cues required, tactile cues required, and needs further education    HOME EXERCISE PROGRAM: Access Code: [email or web address] URL: [email or web address] Date: 01/02/2024 Prepared by: Alm Jenny  Exercises - Small Range Straight Leg Raise  - 2-3 x daily - 1 sets - 10 reps - 3-5 sec hold - Supine Heel Slide with Strap  - 2-3 x daily - 1 sets - 10 reps - Standing Knee Flexion Stretch on Step  - 2-3 x daily - 1 sets - 8 reps - Prone Knee Flexion  - 2-3 x daily - 1 sets - 10 reps - Mini Squat with Counter Support  - 2-3 x daily - 1 sets - 8 reps -  Seated Quad Set  - 2-3 x daily - 1 sets - 10 reps - Prone Terminal Knee Extension  - 2-3 x daily - 1 sets - 10 reps  ASSESSMENT:  CLINICAL IMPRESSION: 01/07/2024: Pt arrives just under 5 weeks post op L TKA with report of steady improvement in functional mobility and overall pain levels. He also reports removing bandage over weekend, able to visually inspect for first time - overall healing well although distally still has a portion that is scabbed and mildly red. Pt states this  appears unchanged since first removing dressing on Saturday, no drainage or erythema - self care education provided as above. LTG addressed below - overall, he demonstrates consistent improvements in knee AROM, TUG, and transfer mechanics. TUG no longer within fall risk category but continues with altered kinematics, worsened without AD. At present recommend continuing along current POC to address remaining deficits and maximize functional tolerance. He tolerates exam/intervention well today without any increase in resting pain. Pt departs today's session in no acute distress, all voiced questions/concerns addressed appropriately from PT perspective.      EVAL: Patient is a pleasant 73 y.o. gentleman who was seen today for physical therapy evaluation and treatment for L TKA DOS 8/20. Pt endorses difficulty w/ ADLs and functional mobility as expected for post surgical status. No red flags today. On exam he demonstrates concordant deficits in L knee ROM, altered gait/transfer mechanics, post op edema. TUG is indicative of reduced mobility and fall risk. Tolerates exam/HEP well overall, no adverse events, self care education as above. Recommend skilled PT to address aforementioned deficits with aim of improving functional tolerance and reducing pain with typical activities. Pt departs today's session in no acute distress, all voiced concerns/questions addressed appropriately from PT perspective.     OBJECTIVE IMPAIRMENTS: Abnormal gait, decreased activity tolerance, decreased balance, decreased endurance, decreased mobility, difficulty walking, decreased ROM, decreased strength, increased edema, impaired perceived functional ability, improper body mechanics, and pain.   ACTIVITY LIMITATIONS: carrying, lifting, sitting, standing, squatting, stairs, transfers, and locomotion level  PARTICIPATION LIMITATIONS: meal prep, cleaning, laundry, and community activity  PERSONAL FACTORS: Age, Time since onset of  injury/illness/exacerbation, and 1-2 comorbidities: GERD, HTN are also affecting patient's functional outcome.   REHAB POTENTIAL: Good  CLINICAL DECISION MAKING: Stable/uncomplicated  EVALUATION COMPLEXITY: Low   GOALS:  SHORT TERM GOALS: Target date: 01/04/2024  Pt will demonstrate appropriate understanding and performance of initially prescribed HEP in order to facilitate improved independence with management of symptoms.  Baseline: HEP established  01/02/24: reports good HEP performance Goal status: MET  2. Pt will report at least 25% improvement in overall pain levels over past week in order to facilitate improved tolerance to typical daily activities.   Baseline: 3-8/10 01/07/24: 0-2/10 Goal status: MET  LONG TERM GOALS: Target date: 02/01/2024   Pt will score 45 or greater on LEFS in order to demonstrate improved perception of function due to symptoms (MCID 9 pts) Baseline: 6/80 01/07/24: 33/80 Goal status: PROGRESSING  2.  Pt will demonstrate at least 0-110 degrees of knee AROM on surgical limb in order to facilitate improved tolerance to functional movements such as squatting, walking, and stair navigation.  Baseline: see ROM chart above 01/07/24: see ROM chart above  Goal status: PARTIALLY MET  3.  Pt will demonstrate appropriate performance of final prescribed HEP in order to facilitate improved self-management of symptoms post-discharge.   Baseline: initial HEP prescribed   01/07/24: reports good HEP performance  Goal status: ONGOING  4.  Pt will be able to perform TUG in less than or equal to 13 sec in order to indicate reduced risk of falling (cutoff score for fall risk 13.5 sec in community dwelling older adults per [facility] et al, 2000)  Baseline: 25sec RW  01/07/24: 10-12sec (SPC vs no AD respectively  Goal status: MET   5. Pt will report at least 50% decrease in overall pain levels in past week in order to facilitate improved tolerance to basic  ADLs/mobility.   Baseline: 3-8/10  01/07/24: 0-2/10  Goal status: MET  6. Pt will demonstrate symmetrical knee MMT between surgical and non-surgical limb in order to facilitate improved functional strength and mechanics.  Baseline: deferred on eval given proximity to surgery  01/07/24: deferred given proximity to surgery  Goal status: ONGOING    PLAN:  PT FREQUENCY: 2x/week  PT DURATION: 8 weeks  PLANNED INTERVENTIONS: 97164- PT Re-evaluation, 97750- Physical Performance Testing, 97110-Therapeutic exercises, 97530- Therapeutic activity, [id number]- Neuromuscular re-education, 97535- Self Care, [id number]- Manual therapy, [id number]- Gait training, [phone]- Electrical stimulation (unattended), 97016- Vasopneumatic device, 20560 (1-2 muscles), 20561 (3+ muscles)- Dry Needling, Patient/Family education, Balance training, Stair training, Taping, Joint mobilization, Scar mobilization, Cryotherapy, and Moist heat  PLAN FOR NEXT SESSION: Review/update HEP PRN. Work on [employer] exercises as appropriate with emphasis on quad activation, knee mobility, functional mechanics, edema management. Gait training,  knee flexion on swing phase; progress AD as able. Symptom modification strategies as indicated/appropriate.    Alm DELENA Jenny PT, DPT 01/07/2024 12:09 PM

## 2024-01-08 NOTE — Therapy (Signed)
 OUTPATIENT PHYSICAL THERAPY TREATMENT   Patient Name: Francisco Dixon MRN: [id number] DOB:02-25-1951, 73 y.o., male Today's Date: 01/09/2024     END OF SESSION:  PT End of Session - 01/09/24 1018     Visit Number 11    Number of Visits 17    Date for Recertification  02/01/24    Authorization Type medicare    Progress Note Due on Visit 20    PT Start Time 1018    PT Stop Time 1059    PT Time Calculation (min) 41 min             Past Medical History:  Diagnosis Date   COVID-19 (12/16/2020) 12/17/2020   GERD (gastroesophageal reflux disease)    Hypercholesterolemia    Hypertension    labile    Plantar fasciitis, right 12/07/2016   Vitamin D  deficiency    Past Surgical History:  Procedure Laterality Date   COLONOSCOPY N/A 2002,2007,2012   ROTATOR CUFF REPAIR Right 2015   Patient Active Problem List   Diagnosis Date Noted   Primary osteoarthritis of left knee 07/13/2023   Hearing loss 07/09/2023   Hemangioma flammeus 07/09/2023   IFG (impaired fasting glucose) 07/09/2023   B12 deficiency 11/12/2018   Numbness and tingling of both feet 11/27/2017   Major depression in full remission 07/09/2017   Medication management 07/07/2017   Abnormal glucose 07/07/2017   Allergy 07/07/2017   Labile hypertension 02/26/2016   Hyperlipidemia, mixed 02/26/2016   Vitamin D  deficiency 02/26/2016    PCP: Alvan Dorothyann BIRCH, MD  REFERRING PROVIDER: Yvone Rush, MD  REFERRING DIAG: [phone] (ICD-10-CM) - History of total left knee replacement (TKR)  THERAPY DIAG:  Left knee pain, unspecified chronicity  Stiffness of left knee, not elsewhere classified  Other abnormalities of gait and mobility  Localized edema  Rationale for Evaluation and Treatment: Rehabilitation  ONSET DATE: s/p L TKA DOS 8/20   SUBJECTIVE:   SUBJECTIVE STATEMENT: 01/09/2024: arrives w/ spc. States surgeon visit went well, they were pretty happy with things and told him just to keep an eye on  his incision. Feeling good today, feels like stairs are improving. Feels like sleep improving. Mildly sore after last session.      EVAL: Spouse currently assisting heavily with self care, housework. Has been working on exercises since surgery, feels pain but has been very adherent with them.  Prior to surgery pt was limited somewhat due to pain, but states he was doing yardwork, modified exercise classes 3x/week (stretching, aerobics, resistance training). Enjoys golfing, hiking, biking but was limited prior to surgery and using cane for a few weeks due to pain.  Is traveling to see daughter in beginning of October and wants to be active for that.   PERTINENT HISTORY: GERD, HTN PAIN:  Are you having pain: none  Per eval:  Location/description: L knee, anterior and posterior Best-worst: 3-8/10; will tend to settle quickly   - aggravating factors: transfers, walking, stairs, housework, stretching - Easing factors: icing, movement    PRECAUTIONS: None  RED FLAGS: None Reports some nausea/dizziness w/ medications, improving No calf pain, fevers/chills, SOB Reports good compliance w/ post op aspirin   WEIGHT BEARING RESTRICTIONS: No  FALLS:  Has patient fallen in last 6 months? Yes. Number of falls 1 fall, walking puppy, denies injury, denies issues with balance  LIVING ENVIRONMENT: 1 story; entry through basement , 13 steps 1 rail to main level; front entrance 3+4 STE 1 rail with sloped entry Lives w/ wife who  is also retired  OCCUPATION: retired - was a Building services engineer for 40 years in [place]    PLOF: Independent  PATIENT GOALS: take trip to see daughter, wants to get back to normal and be more active again (hiking, golfing, biking)   NEXT MD VISIT: Sept 23rd  OBJECTIVE:  Note: Objective measures were completed at Evaluation unless otherwise noted.  DIAGNOSTIC FINDINGS:  S/p L TKA DOS 8/20   PATIENT SURVEYS:  LEFS: 6/80  01/07/24 LEFS 33/80   COGNITION: Overall  cognitive status: Within functional limits for tasks assessed     EDEMA/INSPECTION:  Incision obscured by dressing and compression stocking, no calf pain or swelling, no TTP about calf, no pain w/ DF; no visible drainage  01/07/24: - incision well appearing overall, distally does have ~ 2.5 cm portion that is scabbed/reddish (remainder of incision pink and closed without scabbing) but no surrounding erythema or drainage. Pt states this has remained unchanged since removing bandage Saturday   LOWER EXTREMITY ROM:      Right eval Left eval Left 12/14/23 Left 12/19/23 Left 12/27/23 Left 12/31/23 Left 01/07/24  Hip flexion         Hip extension         Hip internal rotation         Hip external rotation         Knee extension  A: lacking 8 deg w/ towel under calf  AROM 5 deg AROM 5 deg  AROM lacking 3 deg AROM lacking 3-4 deg without prop   Knee flexion  P: 70-72 deg AAROM 90 deg AAROM 101 deg AAROM 109 on bike  AROM supine 114 deg AROM 117 deg supine AROM 118 deg supine   (Blank rows = not tested) (Key: WFL = within functional limits not formally assessed, * = concordant pain, s = stiffness/stretching sensation, NT = not tested)  Comments:    LOWER EXTREMITY MMT:    MMT Right eval Left eval  Hip flexion    Hip abduction (modified sitting)    Hip internal rotation    Hip external rotation    Knee flexion    Knee extension    Ankle dorsiflexion     (Blank rows = not tested) (Key: WFL = within functional limits not formally assessed, * = concordant pain, s = stiffness/stretching sensation, NT = not tested)  Comments: deferred on eval given acuity of surgery   FUNCTIONAL TESTS:  TUG: 25.13 w RW; weight shift to RLE during transfer, BIL UE from RW   01/07/24: TUG 12.01sec no AD but significant alteration in kinematics; 10.56sec w SPC improved mechanics   GAIT: Distance walked: within clinic Assistive device utilized: Environmental consultant - 2 wheeled Level of assistance: Modified  independence Comments: step to pattern leading w/ LLE, discontinuous RW management, reduced knee ROM throughout all phases    [facility] Adult PT Treatment:                                                DATE: 01/09/24  Neuromuscular re-ed: 4 inch step up + RB TKE 2x10 cues for quad activation Bosu TKE push + RB at knee 2x12 Cone step overs 3 laps SPC cues for postural stability and sagittal plane activation   Therapeutic Activity: Recumbent bike 5 min during subjective 4 inch step up x12 UE support cues for mechanics BW squat  w/ UE support 3-3-3 tempo 3x5 cues for symmetry of WB Education/discussion re: functional mechanics and cane use for activity tolerance, monitoring symptoms and gradual progression within tolerance    [facility] Adult PT Treatment:                                                DATE: 01/07/24  Neuromuscular re-ed: Bosu TKE push LLE 2x12 cues for quad activation Step up dynadisc LLE (TKE) 2x10 cues for quad activation  2 laps around clinic w [facility] - emphasizing quad activation/stability in stance phase Retro walking x10 ft w SPC cues for end range extension  Therapeutic Activity: Recumbent bike 4 min during subjective for activity tolerance  MSK assessment + education TUG + education 5xSTS + education Education/discussion re: progress with PT, symptom behavior as it affects activity tolerance, PT goals/POC   Self Care: Education on close monitoring of incision, red flags and indications for emergent work up, communication w/ provider   [facility] Adult PT Treatment:                                                DATE: 01/02/24 Therapeutic Exercise: Prone TKE w/ thigh prop 2x10 cues for setup and reduced hip compensations Propped calf stretch + TKE 2x12  Standing RB hip abduction 2x12 cues for form and posture HEP update + education/handout   Therapeutic Activity: Nu step L4 LE/UE 6 min during subjective  2 inch fwd step up + RB TKE 2x15  Wall squat w/ pball x12 cues  for symmetry  Education re: functional mobility and activity modification                                                                 PATIENT EDUCATION:  Education details: see above Person educated: Patient Education method: Explanation, Demonstration, Tactile cues, Verbal cues Education comprehension: verbalized understanding, returned demonstration, verbal cues required, tactile cues required, and needs further education    HOME EXERCISE PROGRAM: Access Code: [email or web address] URL: [email or web address] Date: 01/02/2024 Prepared by: Alm Jenny  Exercises - Small Range Straight Leg Raise  - 2-3 x daily - 1 sets - 10 reps - 3-5 sec hold - Supine Heel Slide with Strap  - 2-3 x daily - 1 sets - 10 reps - Standing Knee Flexion Stretch on Step  - 2-3 x daily - 1 sets - 8 reps - Prone Knee Flexion  - 2-3 x daily - 1 sets - 10 reps - Mini Squat with Counter Support  - 2-3 x daily - 1 sets - 8 reps - Seated Quad Set  - 2-3 x daily - 1 sets - 10 reps - Prone Terminal Knee Extension  - 2-3 x daily - 1 sets - 10 reps  ASSESSMENT:  CLINICAL IMPRESSION: 01/09/2024: Pt arrives w/ report of continued progress overall, states surgeon follow up went well. Distal portion of incision appears to be improving. Today continuing to focus on quad activation and end range extension as well as postural stability.  Tolerates well without increase in pain but does endorse muscular fatigue as expected. No adverse events. Recommend continuing along current POC in order to address relevant deficits and improve functional tolerance. Pt departs today's session in no acute distress, all voiced questions/concerns addressed appropriately from PT perspective.     EVAL: Patient is a pleasant 73 y.o. gentleman who was seen today for physical therapy evaluation and treatment for L TKA DOS 8/20. Pt endorses difficulty w/ ADLs and functional mobility as expected for post surgical status. No red flags today. On exam  he demonstrates concordant deficits in L knee ROM, altered gait/transfer mechanics, post op edema. TUG is indicative of reduced mobility and fall risk. Tolerates exam/HEP well overall, no adverse events, self care education as above. Recommend skilled PT to address aforementioned deficits with aim of improving functional tolerance and reducing pain with typical activities. Pt departs today's session in no acute distress, all voiced concerns/questions addressed appropriately from PT perspective.     OBJECTIVE IMPAIRMENTS: Abnormal gait, decreased activity tolerance, decreased balance, decreased endurance, decreased mobility, difficulty walking, decreased ROM, decreased strength, increased edema, impaired perceived functional ability, improper body mechanics, and pain.   ACTIVITY LIMITATIONS: carrying, lifting, sitting, standing, squatting, stairs, transfers, and locomotion level  PARTICIPATION LIMITATIONS: meal prep, cleaning, laundry, and community activity  PERSONAL FACTORS: Age, Time since onset of injury/illness/exacerbation, and 1-2 comorbidities: GERD, HTN are also affecting patient's functional outcome.   REHAB POTENTIAL: Good  CLINICAL DECISION MAKING: Stable/uncomplicated  EVALUATION COMPLEXITY: Low   GOALS:  SHORT TERM GOALS: Target date: 01/04/2024  Pt will demonstrate appropriate understanding and performance of initially prescribed HEP in order to facilitate improved independence with management of symptoms.  Baseline: HEP established  01/02/24: reports good HEP performance Goal status: MET  2. Pt will report at least 25% improvement in overall pain levels over past week in order to facilitate improved tolerance to typical daily activities.   Baseline: 3-8/10 01/07/24: 0-2/10 Goal status: MET  LONG TERM GOALS: Target date: 02/01/2024   Pt will score 45 or greater on LEFS in order to demonstrate improved perception of function due to symptoms (MCID 9 pts) Baseline:  6/80 01/07/24: 33/80 Goal status: PROGRESSING  2.  Pt will demonstrate at least 0-110 degrees of knee AROM on surgical limb in order to facilitate improved tolerance to functional movements such as squatting, walking, and stair navigation.  Baseline: see ROM chart above 01/07/24: see ROM chart above  Goal status: PARTIALLY MET  3.  Pt will demonstrate appropriate performance of final prescribed HEP in order to facilitate improved self-management of symptoms post-discharge.   Baseline: initial HEP prescribed   01/07/24: reports good HEP performance  Goal status: ONGOING  4.  Pt will be able to perform TUG in less than or equal to 13 sec in order to indicate reduced risk of falling (cutoff score for fall risk 13.5 sec in community dwelling older adults per [facility] et al, 2000)  Baseline: 25sec RW  01/07/24: 10-12sec (SPC vs no AD respectively  Goal status: MET   5. Pt will report at least 50% decrease in overall pain levels in past week in order to facilitate improved tolerance to basic ADLs/mobility.   Baseline: 3-8/10  01/07/24: 0-2/10  Goal status: MET  6. Pt will demonstrate symmetrical knee MMT between surgical and non-surgical limb in order to facilitate improved functional strength and mechanics.  Baseline: deferred on eval given proximity to surgery  01/07/24: deferred given proximity to surgery  Goal status: ONGOING  PLAN:  PT FREQUENCY: 2x/week  PT DURATION: 8 weeks  PLANNED INTERVENTIONS: 97164- PT Re-evaluation, 97750- Physical Performance Testing, 97110-Therapeutic exercises, 97530- Therapeutic activity, [id number]- Neuromuscular re-education, 97535- Self Care, [id number]- Manual therapy, [phone]- Gait training, [phone]- Electrical stimulation (unattended), 97016- Vasopneumatic device, 20560 (1-2 muscles), 20561 (3+ muscles)- Dry Needling, Patient/Family education, Balance training, Stair training, Taping, Joint mobilization, Scar mobilization, Cryotherapy, and Moist heat  PLAN  FOR NEXT SESSION: Review/update HEP PRN. Work on [employer] exercises as appropriate with emphasis on quad activation, knee mobility, functional mechanics, edema management. Gait training,  knee flexion on swing phase; progress AD as able. Symptom modification strategies as indicated/appropriate.    Alm DELENA Jenny PT, DPT 01/09/2024 11:04 AM

## 2024-01-09 ENCOUNTER — Encounter: Payer: Self-pay | Admitting: Physical Therapy

## 2024-01-09 ENCOUNTER — Ambulatory Visit: Admitting: Physical Therapy

## 2024-01-09 DIAGNOSIS — R600 Localized edema: Secondary | ICD-10-CM | POA: Diagnosis not present

## 2024-01-09 DIAGNOSIS — M25562 Pain in left knee: Secondary | ICD-10-CM

## 2024-01-09 DIAGNOSIS — M25662 Stiffness of left knee, not elsewhere classified: Secondary | ICD-10-CM

## 2024-01-09 DIAGNOSIS — R2689 Other abnormalities of gait and mobility: Secondary | ICD-10-CM

## 2024-01-14 ENCOUNTER — Ambulatory Visit

## 2024-01-14 DIAGNOSIS — M25662 Stiffness of left knee, not elsewhere classified: Secondary | ICD-10-CM

## 2024-01-14 DIAGNOSIS — R600 Localized edema: Secondary | ICD-10-CM

## 2024-01-14 DIAGNOSIS — R2689 Other abnormalities of gait and mobility: Secondary | ICD-10-CM | POA: Diagnosis not present

## 2024-01-14 DIAGNOSIS — M25562 Pain in left knee: Secondary | ICD-10-CM | POA: Diagnosis not present

## 2024-01-14 NOTE — Therapy (Signed)
 OUTPATIENT PHYSICAL THERAPY TREATMENT   Patient Name: Francisco Dixon MRN: [id number] DOB:07-Feb-1951, 73 y.o., male Today's Date: 01/14/2024     END OF SESSION:  PT End of Session - 01/14/24 1103     Visit Number 12    Number of Visits 17    Date for Recertification  02/01/24    Authorization Type medicare    Progress Note Due on Visit 20    PT Start Time 1103    PT Stop Time 1148    PT Time Calculation (min) 45 min    Activity Tolerance Patient tolerated treatment well    Behavior During Therapy WFL for tasks assessed/performed         Past Medical History:  Diagnosis Date   COVID-19 (12/16/2020) 12/17/2020   GERD (gastroesophageal reflux disease)    Hypercholesterolemia    Hypertension    labile    Plantar fasciitis, right 12/07/2016   Vitamin D  deficiency    Past Surgical History:  Procedure Laterality Date   COLONOSCOPY N/A 2002,2007,2012   ROTATOR CUFF REPAIR Right 2015   Patient Active Problem List   Diagnosis Date Noted   Primary osteoarthritis of left knee 07/13/2023   Hearing loss 07/09/2023   Hemangioma flammeus 07/09/2023   IFG (impaired fasting glucose) 07/09/2023   B12 deficiency 11/12/2018   Numbness and tingling of both feet 11/27/2017   Major depression in full remission 07/09/2017   Medication management 07/07/2017   Abnormal glucose 07/07/2017   Allergy 07/07/2017   Labile hypertension 02/26/2016   Hyperlipidemia, mixed 02/26/2016   Vitamin D  deficiency 02/26/2016    PCP: Alvan Dorothyann BIRCH, MD  REFERRING PROVIDER: Yvone Rush, MD  REFERRING DIAG: [phone] (ICD-10-CM) - History of total left knee replacement (TKR)  THERAPY DIAG:  Left knee pain, unspecified chronicity  Stiffness of left knee, not elsewhere classified  Other abnormalities of gait and mobility  Localized edema  Rationale for Evaluation and Treatment: Rehabilitation  ONSET DATE: s/p L TKA DOS 8/20   SUBJECTIVE:   SUBJECTIVE STATEMENT:  Patient reports his  knee is doing well, states he is most challenged with walking down stairs and standing for a long time.   EVAL: Spouse currently assisting heavily with self care, housework. Has been working on exercises since surgery, feels pain but has been very adherent with them.  Prior to surgery pt was limited somewhat due to pain, but states he was doing yardwork, modified exercise classes 3x/week (stretching, aerobics, resistance training). Enjoys golfing, hiking, biking but was limited prior to surgery and using cane for a few weeks due to pain.  Is traveling to see daughter in beginning of October and wants to be active for that.   PERTINENT HISTORY: GERD, HTN PAIN:  Are you having pain: none  Per eval:  Location/description: L knee, anterior and posterior Best-worst: 3-8/10; will tend to settle quickly   - aggravating factors: transfers, walking, stairs, housework, stretching - Easing factors: icing, movement    PRECAUTIONS: None  RED FLAGS: None Reports some nausea/dizziness w/ medications, improving No calf pain, fevers/chills, SOB Reports good compliance w/ post op aspirin   WEIGHT BEARING RESTRICTIONS: No  FALLS:  Has patient fallen in last 6 months? Yes. Number of falls 1 fall, walking puppy, denies injury, denies issues with balance  LIVING ENVIRONMENT: 1 story; entry through basement , 13 steps 1 rail to main level; front entrance 3+4 STE 1 rail with sloped entry Lives w/ wife who is also retired  OCCUPATION: retired - was  a forester for 40 years in [place]    PLOF: Independent  PATIENT GOALS: take trip to see daughter, wants to get back to normal and be more active again (hiking, golfing, biking)   NEXT MD VISIT: November 2025  OBJECTIVE:  Note: Objective measures were completed at Evaluation unless otherwise noted.  DIAGNOSTIC FINDINGS:  S/p L TKA DOS 8/20   PATIENT SURVEYS:  LEFS: 6/80  01/07/24 LEFS 33/80   COGNITION: Overall cognitive status: Within  functional limits for tasks assessed     EDEMA/INSPECTION:  Incision obscured by dressing and compression stocking, no calf pain or swelling, no TTP about calf, no pain w/ DF; no visible drainage  01/07/24: - incision well appearing overall, distally does have ~ 2.5 cm portion that is scabbed/reddish (remainder of incision pink and closed without scabbing) but no surrounding erythema or drainage. Pt states this has remained unchanged since removing bandage Saturday   LOWER EXTREMITY ROM:      Right eval Left eval Left 12/14/23 Left 12/19/23 Left 12/27/23 Left 12/31/23 Left 01/07/24  Hip flexion         Hip extension         Hip internal rotation         Hip external rotation         Knee extension  A: lacking 8 deg w/ towel under calf  AROM 5 deg AROM 5 deg  AROM lacking 3 deg AROM lacking 3-4 deg without prop   Knee flexion  P: 70-72 deg AAROM 90 deg AAROM 101 deg AAROM 109 on bike  AROM supine 114 deg AROM 117 deg supine AROM 118 deg supine   (Blank rows = not tested) (Key: WFL = within functional limits not formally assessed, * = concordant pain, s = stiffness/stretching sensation, NT = not tested)  Comments:    LOWER EXTREMITY MMT:    MMT Right eval Left eval  Hip flexion    Hip abduction (modified sitting)    Hip internal rotation    Hip external rotation    Knee flexion    Knee extension    Ankle dorsiflexion     (Blank rows = not tested) (Key: WFL = within functional limits not formally assessed, * = concordant pain, s = stiffness/stretching sensation, NT = not tested)  Comments: deferred on eval given acuity of surgery   FUNCTIONAL TESTS:  TUG: 25.13 w RW; weight shift to RLE during transfer, BIL UE from RW   01/07/24: TUG 12.01sec no AD but significant alteration in kinematics; 10.56sec w SPC improved mechanics   GAIT: Distance walked: within clinic Assistive device utilized: Environmental consultant - 2 wheeled Level of assistance: Modified independence Comments: step to  pattern leading w/ LLE, discontinuous RW management, reduced knee ROM throughout all phases    [facility] Adult PT Treatment:                                                DATE: 01/14/2024 Neuromuscular re-ed: 4 step heel taps --> Lt SLS Standing TKE + green TB Wall squat --> gradually increasing range Therapeutic Activity: Recumbent bike L1 x 5 min for knee mobility + subjective intake  4 --> 6 step down with Rt leading Backwards walking Prone quad stretch with strap --> progressing knee flexion mobility Gait: Gait training with SPC --> focus on quad activation in weight  bearing & knee flexion on swing phase --> added cue for hip flexion on Lt LE Modalities: Ice at home   [facility] Adult PT Treatment:                                                DATE: 01/09/24 Neuromuscular re-ed: 4 inch step up + RB TKE 2x10 cues for quad activation Bosu TKE push + RB at knee 2x12 Cone step overs 3 laps SPC cues for postural stability and sagittal plane activation Therapeutic Activity: Recumbent bike 5 min during subjective 4 inch step up x12 UE support cues for mechanics BW squat w/ UE support 3-3-3 tempo 3x5 cues for symmetry of WB Education/discussion re: functional mechanics and cane use for activity tolerance, monitoring symptoms and gradual progression within tolerance    [facility] Adult PT Treatment:                                                DATE: 01/07/24 Neuromuscular re-ed: Bosu TKE push LLE 2x12 cues for quad activation Step up dynadisc LLE (TKE) 2x10 cues for quad activation  2 laps around clinic w [facility] - emphasizing quad activation/stability in stance phase Retro walking x10 ft w SPC cues for end range extension Therapeutic Activity: Recumbent bike 4 min during subjective for activity tolerance  MSK assessment + education TUG + education 5xSTS + education Education/discussion re: progress with PT, symptom behavior as it affects activity tolerance, PT goals/POC   Self  Care: Education on close monitoring of incision, red flags and indications for emergent work up, communication w/ provider                                                                  PATIENT EDUCATION:  Education details: Updated HEP Person educated: Patient Education method: Programmer, multimedia, Facilities manager, Actor cues, Verbal cues Education comprehension: verbalized understanding, returned demonstration, verbal cues required, tactile cues required, and needs further education    HOME EXERCISE PROGRAM: Access Code: [email or web address] URL: [email or web address] Date: 01/14/2024 Prepared by: Lamarr Price  Exercises - Small Range Straight Leg Raise  - 2-3 x daily - 1 sets - 10 reps - 3-5 sec hold - Supine Heel Slide with Strap  - 2-3 x daily - 1 sets - 10 reps - Standing Knee Flexion Stretch on Step  - 2-3 x daily - 1 sets - 8 reps - Prone Knee Flexion  - 2-3 x daily - 1 sets - 10 reps - Mini Squat with Counter Support  - 2-3 x daily - 1 sets - 8 reps - Seated Quad Set  - 2-3 x daily - 1 sets - 10 reps - Prone Terminal Knee Extension  - 2-3 x daily - 1 sets - 10 reps - Standing Terminal Knee Extension at Wall with Ball  - 2-3 x daily - 1 sets - 10 reps - 5 sec hold - Standing Lateral Step-Down Heel Tap  - 1 x daily - 7 x weekly - 3 sets - 10  reps  ASSESSMENT:  CLINICAL IMPRESSION:   Cueing hip flexion on swing phase improved functional knee flexion during gait training. Patient able to perform heel tap down from low step with no exacerbation of pain. Quad activation continued with addition of standing total knee extension.   EVAL: Patient is a pleasant 73 y.o. gentleman who was seen today for physical therapy evaluation and treatment for L TKA DOS 8/20. Pt endorses difficulty w/ ADLs and functional mobility as expected for post surgical status. No red flags today. On exam he demonstrates concordant deficits in L knee ROM, altered gait/transfer mechanics, post op edema. TUG is  indicative of reduced mobility and fall risk. Tolerates exam/HEP well overall, no adverse events, self care education as above. Recommend skilled PT to address aforementioned deficits with aim of improving functional tolerance and reducing pain with typical activities. Pt departs today's session in no acute distress, all voiced concerns/questions addressed appropriately from PT perspective.     OBJECTIVE IMPAIRMENTS: Abnormal gait, decreased activity tolerance, decreased balance, decreased endurance, decreased mobility, difficulty walking, decreased ROM, decreased strength, increased edema, impaired perceived functional ability, improper body mechanics, and pain.   ACTIVITY LIMITATIONS: carrying, lifting, sitting, standing, squatting, stairs, transfers, and locomotion level  PARTICIPATION LIMITATIONS: meal prep, cleaning, laundry, and community activity  PERSONAL FACTORS: Age, Time since onset of injury/illness/exacerbation, and 1-2 comorbidities: GERD, HTN are also affecting patient's functional outcome.   REHAB POTENTIAL: Good  CLINICAL DECISION MAKING: Stable/uncomplicated  EVALUATION COMPLEXITY: Low   GOALS:  SHORT TERM GOALS: Target date: 01/04/2024  Pt will demonstrate appropriate understanding and performance of initially prescribed HEP in order to facilitate improved independence with management of symptoms.  Baseline: HEP established  01/02/24: reports good HEP performance Goal status: MET  2. Pt will report at least 25% improvement in overall pain levels over past week in order to facilitate improved tolerance to typical daily activities.   Baseline: 3-8/10 01/07/24: 0-2/10 Goal status: MET  LONG TERM GOALS: Target date: 02/01/2024   Pt will score 45 or greater on LEFS in order to demonstrate improved perception of function due to symptoms (MCID 9 pts) Baseline: 6/80 01/07/24: 33/80 Goal status: PROGRESSING  2.  Pt will demonstrate at least 0-110 degrees of knee AROM on  surgical limb in order to facilitate improved tolerance to functional movements such as squatting, walking, and stair navigation.  Baseline: see ROM chart above 01/07/24: see ROM chart above  Goal status: PARTIALLY MET  3.  Pt will demonstrate appropriate performance of final prescribed HEP in order to facilitate improved self-management of symptoms post-discharge.   Baseline: initial HEP prescribed   01/07/24: reports good HEP performance  Goal status: ONGOING  4.  Pt will be able to perform TUG in less than or equal to 13 sec in order to indicate reduced risk of falling (cutoff score for fall risk 13.5 sec in community dwelling older adults per [facility] et al, 2000)  Baseline: 25sec RW  01/07/24: 10-12sec (SPC vs no AD respectively  Goal status: MET   5. Pt will report at least 50% decrease in overall pain levels in past week in order to facilitate improved tolerance to basic ADLs/mobility.   Baseline: 3-8/10  01/07/24: 0-2/10  Goal status: MET  6. Pt will demonstrate symmetrical knee MMT between surgical and non-surgical limb in order to facilitate improved functional strength and mechanics.  Baseline: deferred on eval given proximity to surgery  01/07/24: deferred given proximity to surgery  Goal status: ONGOING  PLAN:  PT FREQUENCY: 2x/week  PT DURATION: 8 weeks  PLANNED INTERVENTIONS: 97164- PT Re-evaluation, 97750- Physical Performance Testing, 97110-Therapeutic exercises, 97530- Therapeutic activity, [id number]- Neuromuscular re-education, 97535- Self Care, [id number]- Manual therapy, [phone]- Gait training, [phone]- Electrical stimulation (unattended), 97016- Vasopneumatic device, 20560 (1-2 muscles), 20561 (3+ muscles)- Dry Needling, Patient/Family education, Balance training, Stair training, Taping, Joint mobilization, Scar mobilization, Cryotherapy, and Moist heat  PLAN FOR NEXT SESSION: Review/update HEP PRN. Work on [employer] exercises as appropriate with emphasis on quad  activation, knee mobility, functional mechanics, edema management. Gait training,  knee flexion on swing phase; progress AD as able. Symptom modification strategies as indicated/appropriate.    Lamarr Price, PTA 01/14/2024 11:49 AM

## 2024-01-15 NOTE — Therapy (Signed)
 OUTPATIENT PHYSICAL THERAPY TREATMENT   Patient Name: Francisco Dixon MRN: [id number] DOB:1950-07-09, 73 y.o., male Today's Date: 01/15/2024     END OF SESSION:   Past Medical History:  Diagnosis Date   COVID-19 (12/16/2020) 12/17/2020   GERD (gastroesophageal reflux disease)    Hypercholesterolemia    Hypertension    labile    Plantar fasciitis, right 12/07/2016   Vitamin D  deficiency    Past Surgical History:  Procedure Laterality Date   COLONOSCOPY N/A 2002,2007,2012   ROTATOR CUFF REPAIR Right 2015   Patient Active Problem List   Diagnosis Date Noted   Primary osteoarthritis of left knee 07/13/2023   Hearing loss 07/09/2023   Hemangioma flammeus 07/09/2023   IFG (impaired fasting glucose) 07/09/2023   B12 deficiency 11/12/2018   Numbness and tingling of both feet 11/27/2017   Major depression in full remission 07/09/2017   Medication management 07/07/2017   Abnormal glucose 07/07/2017   Allergy 07/07/2017   Labile hypertension 02/26/2016   Hyperlipidemia, mixed 02/26/2016   Vitamin D  deficiency 02/26/2016    PCP: Alvan Dorothyann BIRCH, MD  REFERRING PROVIDER: Yvone Rush, MD  REFERRING DIAG: [phone] (ICD-10-CM) - History of total left knee replacement (TKR)  THERAPY DIAG:  No diagnosis found.  Rationale for Evaluation and Treatment: Rehabilitation  ONSET DATE: s/p L TKA DOS 8/20   SUBJECTIVE:   SUBJECTIVE STATEMENT:  01/15/2024: ***  *** Patient reports his knee is doing well, states he is most challenged with walking down stairs and standing for a long time.   EVAL: Spouse currently assisting heavily with self care, housework. Has been working on exercises since surgery, feels pain but has been very adherent with them.  Prior to surgery pt was limited somewhat due to pain, but states he was doing yardwork, modified exercise classes 3x/week (stretching, aerobics, resistance training). Enjoys golfing, hiking, biking but was limited prior to surgery  and using cane for a few weeks due to pain.  Is traveling to see daughter in beginning of October and wants to be active for that.   PERTINENT HISTORY: GERD, HTN PAIN:  Are you having pain: none  Per eval:  Location/description: L knee, anterior and posterior Best-worst: 3-8/10; will tend to settle quickly   - aggravating factors: transfers, walking, stairs, housework, stretching - Easing factors: icing, movement    PRECAUTIONS: None  RED FLAGS: None Reports some nausea/dizziness w/ medications, improving No calf pain, fevers/chills, SOB Reports good compliance w/ post op aspirin   WEIGHT BEARING RESTRICTIONS: No  FALLS:  Has patient fallen in last 6 months? Yes. Number of falls 1 fall, walking puppy, denies injury, denies issues with balance  LIVING ENVIRONMENT: 1 story; entry through basement , 13 steps 1 rail to main level; front entrance 3+4 STE 1 rail with sloped entry Lives w/ wife who is also retired  OCCUPATION: retired - was a Building services engineer for 40 years in [place]    PLOF: Independent  PATIENT GOALS: take trip to see daughter, wants to get back to normal and be more active again (hiking, golfing, biking)   NEXT MD VISIT: November 2025  OBJECTIVE:  Note: Objective measures were completed at Evaluation unless otherwise noted.  DIAGNOSTIC FINDINGS:  S/p L TKA DOS 8/20   PATIENT SURVEYS:  LEFS: 6/80  01/07/24 LEFS 33/80   COGNITION: Overall cognitive status: Within functional limits for tasks assessed     EDEMA/INSPECTION:  Incision obscured by dressing and compression stocking, no calf pain or swelling, no TTP about calf,  no pain w/ DF; no visible drainage  01/07/24: - incision well appearing overall, distally does have ~ 2.5 cm portion that is scabbed/reddish (remainder of incision pink and closed without scabbing) but no surrounding erythema or drainage. Pt states this has remained unchanged since removing bandage Saturday   LOWER EXTREMITY ROM:       Right eval Left eval Left 12/14/23 Left 12/19/23 Left 12/27/23 Left 12/31/23 Left 01/07/24  Hip flexion         Hip extension         Hip internal rotation         Hip external rotation         Knee extension  A: lacking 8 deg w/ towel under calf  AROM 5 deg AROM 5 deg  AROM lacking 3 deg AROM lacking 3-4 deg without prop   Knee flexion  P: 70-72 deg AAROM 90 deg AAROM 101 deg AAROM 109 on bike  AROM supine 114 deg AROM 117 deg supine AROM 118 deg supine   (Blank rows = not tested) (Key: WFL = within functional limits not formally assessed, * = concordant pain, s = stiffness/stretching sensation, NT = not tested)  Comments:    LOWER EXTREMITY MMT:    MMT Right eval Left eval  Hip flexion    Hip abduction (modified sitting)    Hip internal rotation    Hip external rotation    Knee flexion    Knee extension    Ankle dorsiflexion     (Blank rows = not tested) (Key: WFL = within functional limits not formally assessed, * = concordant pain, s = stiffness/stretching sensation, NT = not tested)  Comments: deferred on eval given acuity of surgery   FUNCTIONAL TESTS:  TUG: 25.13 w RW; weight shift to RLE during transfer, BIL UE from RW   01/07/24: TUG 12.01sec no AD but significant alteration in kinematics; 10.56sec w SPC improved mechanics   GAIT: Distance walked: within clinic Assistive device utilized: Environmental consultant - 2 wheeled Level of assistance: Modified independence Comments: step to pattern leading w/ LLE, discontinuous RW management, reduced knee ROM throughout all phases    [facility] Adult PT Treatment:                                                DATE: 01/16/24 Therapeutic Exercise: *** Manual Therapy: *** Neuromuscular re-ed: *** Therapeutic Activity: *** Modalities: *** Self Care: ***    [facility] Adult PT Treatment:                                                DATE: 01/14/2024 Neuromuscular re-ed: 4 step heel taps --> Lt SLS Standing TKE + green TB Wall  squat --> gradually increasing range Therapeutic Activity: Recumbent bike L1 x 5 min for knee mobility + subjective intake  4 --> 6 step down with Rt leading Backwards walking Prone quad stretch with strap --> progressing knee flexion mobility Gait: Gait training with SPC --> focus on quad activation in weight bearing & knee flexion on swing phase --> added cue for hip flexion on Lt LE Modalities: Ice at home   [facility] Adult PT Treatment:  DATE: 01/09/24 Neuromuscular re-ed: 4 inch step up + RB TKE 2x10 cues for quad activation Bosu TKE push + RB at knee 2x12 Cone step overs 3 laps SPC cues for postural stability and sagittal plane activation Therapeutic Activity: Recumbent bike 5 min during subjective 4 inch step up x12 UE support cues for mechanics BW squat w/ UE support 3-3-3 tempo 3x5 cues for symmetry of WB Education/discussion re: functional mechanics and cane use for activity tolerance, monitoring symptoms and gradual progression within tolerance                                                                  PATIENT EDUCATION:  Education details: Updated HEP Person educated: Patient Education method: Explanation, Demonstration, Tactile cues, Verbal cues Education comprehension: verbalized understanding, returned demonstration, verbal cues required, tactile cues required, and needs further education    HOME EXERCISE PROGRAM: Access Code: [email or web address] URL: [email or web address] Date: 01/14/2024 Prepared by: Lamarr Price  Exercises - Small Range Straight Leg Raise  - 2-3 x daily - 1 sets - 10 reps - 3-5 sec hold - Supine Heel Slide with Strap  - 2-3 x daily - 1 sets - 10 reps - Standing Knee Flexion Stretch on Step  - 2-3 x daily - 1 sets - 8 reps - Prone Knee Flexion  - 2-3 x daily - 1 sets - 10 reps - Mini Squat with Counter Support  - 2-3 x daily - 1 sets - 8 reps - Seated Quad Set  - 2-3 x daily - 1 sets - 10  reps - Prone Terminal Knee Extension  - 2-3 x daily - 1 sets - 10 reps - Standing Terminal Knee Extension at Wall with Ball  - 2-3 x daily - 1 sets - 10 reps - 5 sec hold - Standing Lateral Step-Down Heel Tap  - 1 x daily - 7 x weekly - 3 sets - 10 reps  ASSESSMENT:  CLINICAL IMPRESSION:  01/15/2024: ***  ***  Cueing hip flexion on swing phase improved functional knee flexion during gait training. Patient able to perform heel tap down from low step with no exacerbation of pain. Quad activation continued with addition of standing total knee extension.   EVAL: Patient is a pleasant 73 y.o. gentleman who was seen today for physical therapy evaluation and treatment for L TKA DOS 8/20. Pt endorses difficulty w/ ADLs and functional mobility as expected for post surgical status. No red flags today. On exam he demonstrates concordant deficits in L knee ROM, altered gait/transfer mechanics, post op edema. TUG is indicative of reduced mobility and fall risk. Tolerates exam/HEP well overall, no adverse events, self care education as above. Recommend skilled PT to address aforementioned deficits with aim of improving functional tolerance and reducing pain with typical activities. Pt departs today's session in no acute distress, all voiced concerns/questions addressed appropriately from PT perspective.     OBJECTIVE IMPAIRMENTS: Abnormal gait, decreased activity tolerance, decreased balance, decreased endurance, decreased mobility, difficulty walking, decreased ROM, decreased strength, increased edema, impaired perceived functional ability, improper body mechanics, and pain.   ACTIVITY LIMITATIONS: carrying, lifting, sitting, standing, squatting, stairs, transfers, and locomotion level  PARTICIPATION LIMITATIONS: meal prep, cleaning, laundry, and community activity  PERSONAL FACTORS: Age, Time since onset of injury/illness/exacerbation,  and 1-2 comorbidities: GERD, HTN are also affecting patient's functional  outcome.   REHAB POTENTIAL: Good  CLINICAL DECISION MAKING: Stable/uncomplicated  EVALUATION COMPLEXITY: Low   GOALS:  SHORT TERM GOALS: Target date: 01/04/2024  Pt will demonstrate appropriate understanding and performance of initially prescribed HEP in order to facilitate improved independence with management of symptoms.  Baseline: HEP established  01/02/24: reports good HEP performance Goal status: MET  2. Pt will report at least 25% improvement in overall pain levels over past week in order to facilitate improved tolerance to typical daily activities.   Baseline: 3-8/10 01/07/24: 0-2/10 Goal status: MET  LONG TERM GOALS: Target date: 02/01/2024   Pt will score 45 or greater on LEFS in order to demonstrate improved perception of function due to symptoms (MCID 9 pts) Baseline: 6/80 01/07/24: 33/80 Goal status: PROGRESSING  2.  Pt will demonstrate at least 0-110 degrees of knee AROM on surgical limb in order to facilitate improved tolerance to functional movements such as squatting, walking, and stair navigation.  Baseline: see ROM chart above 01/07/24: see ROM chart above  Goal status: PARTIALLY MET  3.  Pt will demonstrate appropriate performance of final prescribed HEP in order to facilitate improved self-management of symptoms post-discharge.   Baseline: initial HEP prescribed   01/07/24: reports good HEP performance  Goal status: ONGOING  4.  Pt will be able to perform TUG in less than or equal to 13 sec in order to indicate reduced risk of falling (cutoff score for fall risk 13.5 sec in community dwelling older adults per [facility] et al, 2000)  Baseline: 25sec RW  01/07/24: 10-12sec (SPC vs no AD respectively  Goal status: MET   5. Pt will report at least 50% decrease in overall pain levels in past week in order to facilitate improved tolerance to basic ADLs/mobility.   Baseline: 3-8/10  01/07/24: 0-2/10  Goal status: MET  6. Pt will demonstrate symmetrical knee  MMT between surgical and non-surgical limb in order to facilitate improved functional strength and mechanics.  Baseline: deferred on eval given proximity to surgery  01/07/24: deferred given proximity to surgery  Goal status: ONGOING    PLAN:  PT FREQUENCY: 2x/week  PT DURATION: 8 weeks  PLANNED INTERVENTIONS: 97164- PT Re-evaluation, 97750- Physical Performance Testing, 97110-Therapeutic exercises, 97530- Therapeutic activity, [id number]- Neuromuscular re-education, 97535- Self Care, [id number]- Manual therapy, [id number]- Gait training, [phone]- Electrical stimulation (unattended), 97016- Vasopneumatic device, 20560 (1-2 muscles), 20561 (3+ muscles)- Dry Needling, Patient/Family education, Balance training, Stair training, Taping, Joint mobilization, Scar mobilization, Cryotherapy, and Moist heat  PLAN FOR NEXT SESSION: Review/update HEP PRN. Work on [employer] exercises as appropriate with emphasis on quad activation, knee mobility, functional mechanics, edema management. Gait training,  knee flexion on swing phase; progress AD as able. Symptom modification strategies as indicated/appropriate.    Alm DELENA Jenny PT, DPT 01/15/2024 8:12 AM

## 2024-01-16 ENCOUNTER — Encounter: Payer: Self-pay | Admitting: Physical Therapy

## 2024-01-16 ENCOUNTER — Ambulatory Visit: Attending: Orthopedic Surgery | Admitting: Physical Therapy

## 2024-01-16 DIAGNOSIS — M25562 Pain in left knee: Secondary | ICD-10-CM | POA: Diagnosis not present

## 2024-01-16 DIAGNOSIS — R600 Localized edema: Secondary | ICD-10-CM | POA: Diagnosis not present

## 2024-01-16 DIAGNOSIS — M25662 Stiffness of left knee, not elsewhere classified: Secondary | ICD-10-CM | POA: Insufficient documentation

## 2024-01-16 DIAGNOSIS — R2689 Other abnormalities of gait and mobility: Secondary | ICD-10-CM | POA: Diagnosis not present

## 2024-01-21 ENCOUNTER — Encounter: Payer: Self-pay | Admitting: Physical Therapy

## 2024-01-21 ENCOUNTER — Ambulatory Visit: Admitting: Physical Therapy

## 2024-01-21 DIAGNOSIS — R600 Localized edema: Secondary | ICD-10-CM | POA: Diagnosis not present

## 2024-01-21 DIAGNOSIS — M25662 Stiffness of left knee, not elsewhere classified: Secondary | ICD-10-CM | POA: Diagnosis not present

## 2024-01-21 DIAGNOSIS — M25562 Pain in left knee: Secondary | ICD-10-CM

## 2024-01-21 DIAGNOSIS — R2689 Other abnormalities of gait and mobility: Secondary | ICD-10-CM

## 2024-01-21 DIAGNOSIS — Z23 Encounter for immunization: Secondary | ICD-10-CM | POA: Diagnosis not present

## 2024-01-21 NOTE — Therapy (Signed)
 OUTPATIENT PHYSICAL THERAPY TREATMENT   Patient Name: Francisco Dixon MRN: [id number] DOB:[birth year], 73 y.o., male Today's Date: 01/21/2024     END OF SESSION:  PT End of Session - 01/21/24 1104     Visit Number 14    Number of Visits 17    Date for Recertification  02/01/24    Authorization Type medicare    Progress Note Due on Visit 20    PT Start Time 1104    PT Stop Time 1146    PT Time Calculation (min) 42 min           Past Medical History:  Diagnosis Date   COVID-19 (12/16/2020) 12/17/2020   GERD (gastroesophageal reflux disease)    Hypercholesterolemia    Hypertension    labile    Plantar fasciitis, right 12/07/2016   Vitamin D  deficiency    Past Surgical History:  Procedure Laterality Date   COLONOSCOPY N/A 2002,2007,2012   ROTATOR CUFF REPAIR Right 2015   Patient Active Problem List   Diagnosis Date Noted   Primary osteoarthritis of left knee 07/13/2023   Hearing loss 07/09/2023   Hemangioma flammeus 07/09/2023   IFG (impaired fasting glucose) 07/09/2023   B12 deficiency 11/12/2018   Numbness and tingling of both feet 11/27/2017   Major depression in full remission 07/09/2017   Medication management 07/07/2017   Abnormal glucose 07/07/2017   Allergy 07/07/2017   Labile hypertension 02/26/2016   Hyperlipidemia, mixed 02/26/2016   Vitamin D  deficiency 02/26/2016    PCP: Alvan Dorothyann BIRCH, MD  REFERRING PROVIDER: Yvone Rush, MD  REFERRING DIAG: [phone] (ICD-10-CM) - History of total left knee replacement (TKR)  THERAPY DIAG:  Left knee pain, unspecified chronicity  Stiffness of left knee, not elsewhere classified  Other abnormalities of gait and mobility  Localized edema  Rationale for Evaluation and Treatment: Rehabilitation  ONSET DATE: s/p L TKA DOS 8/20   SUBJECTIVE:   SUBJECTIVE STATEMENT:  01/21/2024: doing well, no new issues. Still having the most trouble with static standing for prolonged periods. No issues after last  session.  EVAL: Spouse currently assisting heavily with self care, housework. Has been working on exercises since surgery, feels pain but has been very adherent with them.  Prior to surgery pt was limited somewhat due to pain, but states he was doing yardwork, modified exercise classes 3x/week (stretching, aerobics, resistance training). Enjoys golfing, hiking, biking but was limited prior to surgery and using cane for a few weeks due to pain.  Is traveling to see daughter in beginning of October and wants to be active for that.   PERTINENT HISTORY: GERD, HTN PAIN:  Are you having pain: none, just soreness  Per eval:  Location/description: L knee, anterior and posterior Best-worst: 3-8/10; will tend to settle quickly   - aggravating factors: transfers, walking, stairs, housework, stretching - Easing factors: icing, movement    PRECAUTIONS: None  RED FLAGS: None Reports some nausea/dizziness w/ medications, improving No calf pain, fevers/chills, SOB Reports good compliance w/ post op aspirin   WEIGHT BEARING RESTRICTIONS: No  FALLS:  Has patient fallen in last 6 months? Yes. Number of falls 1 fall, walking puppy, denies injury, denies issues with balance  LIVING ENVIRONMENT: 1 story; entry through basement , 13 steps 1 rail to main level; front entrance 3+4 STE 1 rail with sloped entry Lives w/ wife who is also retired  OCCUPATION: retired - was a Building services engineer for 40 years in [place]    PLOF: Independent  PATIENT GOALS:  take trip to see daughter, wants to get back to normal and be more active again (hiking, golfing, biking)   NEXT MD VISIT: November 2025  OBJECTIVE:  Note: Objective measures were completed at Evaluation unless otherwise noted.  DIAGNOSTIC FINDINGS:  S/p L TKA DOS 8/20   PATIENT SURVEYS:  LEFS: 6/80  01/07/24 LEFS 33/80   COGNITION: Overall cognitive status: Within functional limits for tasks assessed     EDEMA/INSPECTION:  Incision obscured  by dressing and compression stocking, no calf pain or swelling, no TTP about calf, no pain w/ DF; no visible drainage  01/07/24: - incision well appearing overall, distally does have ~ 2.5 cm portion that is scabbed/reddish (remainder of incision pink and closed without scabbing) but no surrounding erythema or drainage. Pt states this has remained unchanged since removing bandage Saturday   LOWER EXTREMITY ROM:      Right eval Left eval Left 12/14/23 Left 12/19/23 Left 12/27/23 Left 12/31/23 Left 01/07/24 Left 01/16/24  Hip flexion          Hip extension          Hip internal rotation          Hip external rotation          Knee extension  A: lacking 8 deg w/ towel under calf  AROM 5 deg AROM 5 deg  AROM lacking 3 deg AROM lacking 3-4 deg without prop  Full with prop    Knee flexion  P: 70-72 deg AAROM 90 deg AAROM 101 deg AAROM 109 on bike  AROM supine 114 deg AROM 117 deg supine AROM 118 deg supine    (Blank rows = not tested) (Key: WFL = within functional limits not formally assessed, * = concordant pain, s = stiffness/stretching sensation, NT = not tested)  Comments:    LOWER EXTREMITY MMT:    MMT Right eval Left eval  Hip flexion    Hip abduction (modified sitting)    Hip internal rotation    Hip external rotation    Knee flexion    Knee extension    Ankle dorsiflexion     (Blank rows = not tested) (Key: WFL = within functional limits not formally assessed, * = concordant pain, s = stiffness/stretching sensation, NT = not tested)  Comments: deferred on eval given acuity of surgery   FUNCTIONAL TESTS:  TUG: 25.13 w RW; weight shift to RLE during transfer, BIL UE from RW   01/07/24: TUG 12.01sec no AD but significant alteration in kinematics; 10.56sec w SPC improved mechanics   GAIT: Distance walked: within clinic Assistive device utilized: Environmental consultant - 2 wheeled Level of assistance: Modified independence Comments: step to pattern leading w/ LLE, discontinuous RW  management, reduced knee ROM throughout all phases    [facility] Adult PT Treatment:                                                DATE: 01/21/24 Therapeutic Exercise: Recumbent bike 6 min during subjective for ROM  Standing hip hike/drop off step, 2x6 LLE cues for mechanics  HEP update + education/handout  Neuromuscular re-ed: Seated quad set; x15; self tactile cues to maximize quad activation and mitigate posterior hip activation, visual demonstration Supine quad set x15 tactile cues as above Standing glute set (pushing into step, mini lunge position) x15 LLE  Standing GB TKE x10  emphasis on avoiding posterior hip compensations, leg positioned in hip ext  Therapeutic Activity: Obstacle course 4 laps no AD (2 inch step over, 4 cone stepovers (step to), lateral step over DB x2) CGA 2 inch fwd step down 2x12 LLE stance   [facility] Adult PT Treatment:                                                DATE: 01/16/24 Therapeutic Exercise: Recumbent bike 5 min during subjective for ROM  Supine hamstring curl w pball x12 Supine pball mini bridge hold 30sec Supine pball longer lever mini bridge hold 2x15sec  Standing TKE ball at wall, 10sec hold,   Neuromuscular re-ed: SLS on theraband foam; 2x30sec LLE UE support PRN, CGA LLE stance; RLE fwd/retro step; ~30deg knee bend x10 LLE stance; RLE fwd/retro step; knee extended x10  LLE stance; RLE fwd/retro step; knee relaxed x10 Cone stepovers no AD step to pattern 6 laps in total, cues for posture, quad activation in stance, reduced frontal plane compensations Supine/seated quad sets practice reps w/ tactile cues at quad and hip to minimize hip extensor compensations, encouraged performing HEP this way    [facility] Adult PT Treatment:                                                DATE: 01/14/2024 Neuromuscular re-ed: 4 step heel taps --> Lt SLS Standing TKE + green TB Wall squat --> gradually increasing range Therapeutic Activity: Recumbent bike L1 x 5  min for knee mobility + subjective intake  4 --> 6 step down with Rt leading Backwards walking Prone quad stretch with strap --> progressing knee flexion mobility Gait: Gait training with SPC --> focus on quad activation in weight bearing & knee flexion on swing phase --> added cue for hip flexion on Lt LE Modalities: Ice at home                                                                   PATIENT EDUCATION:  Education details: Updated HEP Person educated: Patient Education method: Explanation, Demonstration, Actor cues, Verbal cues Education comprehension: verbalized understanding, returned demonstration, verbal cues required, tactile cues required, and needs further education    HOME EXERCISE PROGRAM: Access Code: [email or web address] URL: [email or web address] Date: 01/21/2024 Prepared by: Alm Jenny  Exercises - Prone Knee Flexion  - 2-3 x daily - 1 sets - 10 reps - Mini Squat with Counter Support  - 2-3 x daily - 1 sets - 8 reps - Seated Quad Set  - 2-3 x daily - 1 sets - 10 reps - Prone Terminal Knee Extension  - 2-3 x daily - 1 sets - 10 reps - Standing Terminal Knee Extension at Wall with Ball  - 2-3 x daily - 1 sets - 10 reps - 5 sec hold - Standing Lateral Step-Down Heel Tap  - 1 x daily - 7 x weekly - 3 sets - 10 reps - Forward Step Down with Counter Support at  Side  - 2-3 x daily - 1 sets - 8 reps - Standing Terminal Knee Extension with Resistance  - 2-3 x daily - 1 sets - 10 reps  ASSESSMENT:  CLINICAL IMPRESSION:  01/21/2024: Pt arrives w/o new complaints, no issues after last session. Today continuing to work on dissociating quad and posterior hip activation as he continues to demonstrate significant compensations in this regard. Also working on dynamic postural stability, emphasis on reducing compensations at hip/trunk. Cues as above, tolerates session well overall but does endorse about 2/10 ache at end of session, no adverse events. Recommend  continuing along current POC in order to address relevant deficits and improve functional tolerance. Pt departs today's session in no acute distress, all voiced questions/concerns addressed appropriately from PT perspective.      EVAL: Patient is a pleasant 73 y.o. gentleman who was seen today for physical therapy evaluation and treatment for L TKA DOS 8/20. Pt endorses difficulty w/ ADLs and functional mobility as expected for post surgical status. No red flags today. On exam he demonstrates concordant deficits in L knee ROM, altered gait/transfer mechanics, post op edema. TUG is indicative of reduced mobility and fall risk. Tolerates exam/HEP well overall, no adverse events, self care education as above. Recommend skilled PT to address aforementioned deficits with aim of improving functional tolerance and reducing pain with typical activities. Pt departs today's session in no acute distress, all voiced concerns/questions addressed appropriately from PT perspective.     OBJECTIVE IMPAIRMENTS: Abnormal gait, decreased activity tolerance, decreased balance, decreased endurance, decreased mobility, difficulty walking, decreased ROM, decreased strength, increased edema, impaired perceived functional ability, improper body mechanics, and pain.   ACTIVITY LIMITATIONS: carrying, lifting, sitting, standing, squatting, stairs, transfers, and locomotion level  PARTICIPATION LIMITATIONS: meal prep, cleaning, laundry, and community activity  PERSONAL FACTORS: Age, Time since onset of injury/illness/exacerbation, and 1-2 comorbidities: GERD, HTN are also affecting patient's functional outcome.   REHAB POTENTIAL: Good  CLINICAL DECISION MAKING: Stable/uncomplicated  EVALUATION COMPLEXITY: Low   GOALS:  SHORT TERM GOALS: Target date: 01/04/2024  Pt will demonstrate appropriate understanding and performance of initially prescribed HEP in order to facilitate improved independence with management of symptoms.   Baseline: HEP established  01/02/24: reports good HEP performance Goal status: MET  2. Pt will report at least 25% improvement in overall pain levels over past week in order to facilitate improved tolerance to typical daily activities.   Baseline: 3-8/10 01/07/24: 0-2/10 Goal status: MET  LONG TERM GOALS: Target date: 02/01/2024   Pt will score 45 or greater on LEFS in order to demonstrate improved perception of function due to symptoms (MCID 9 pts) Baseline: 6/80 01/07/24: 33/80 Goal status: PROGRESSING  2.  Pt will demonstrate at least 0-110 degrees of knee AROM on surgical limb in order to facilitate improved tolerance to functional movements such as squatting, walking, and stair navigation.  Baseline: see ROM chart above 01/07/24: see ROM chart above  Goal status: PARTIALLY MET  3.  Pt will demonstrate appropriate performance of final prescribed HEP in order to facilitate improved self-management of symptoms post-discharge.   Baseline: initial HEP prescribed   01/07/24: reports good HEP performance  Goal status: ONGOING  4.  Pt will be able to perform TUG in less than or equal to 13 sec in order to indicate reduced risk of falling (cutoff score for fall risk 13.5 sec in community dwelling older adults per [facility] et al, 2000)  Baseline: 25sec RW  01/07/24: 10-12sec (SPC vs no  AD respectively  Goal status: MET   5. Pt will report at least 50% decrease in overall pain levels in past week in order to facilitate improved tolerance to basic ADLs/mobility.   Baseline: 3-8/10  01/07/24: 0-2/10  Goal status: MET  6. Pt will demonstrate symmetrical knee MMT between surgical and non-surgical limb in order to facilitate improved functional strength and mechanics.  Baseline: deferred on eval given proximity to surgery  01/07/24: deferred given proximity to surgery  Goal status: ONGOING    PLAN:  PT FREQUENCY: 2x/week  PT DURATION: 8 weeks  PLANNED INTERVENTIONS: [id number]- PT  Re-evaluation, 97750- Physical Performance Testing, 97110-Therapeutic exercises, 97530- Therapeutic activity, [id number]- Neuromuscular re-education, 97535- Self Care, [id number]- Manual therapy, [id number]- Gait training, [phone]- Electrical stimulation (unattended), 97016- Vasopneumatic device, 20560 (1-2 muscles), 20561 (3+ muscles)- Dry Needling, Patient/Family education, Balance training, Stair training, Taping, Joint mobilization, Scar mobilization, Cryotherapy, and Moist heat  PLAN FOR NEXT SESSION: Review/update HEP PRN. Work on [employer] exercises as appropriate with emphasis on quad activation, knee mobility, functional mechanics, edema management. Gait training,  knee flexion on swing phase; progress AD as able. Symptom modification strategies as indicated/appropriate.    Alm DELENA Jenny PT, DPT 01/21/2024 12:35 PM

## 2024-01-23 ENCOUNTER — Ambulatory Visit: Admitting: Physical Therapy

## 2024-01-23 ENCOUNTER — Encounter: Payer: Self-pay | Admitting: Physical Therapy

## 2024-01-23 DIAGNOSIS — M25562 Pain in left knee: Secondary | ICD-10-CM

## 2024-01-23 DIAGNOSIS — M25662 Stiffness of left knee, not elsewhere classified: Secondary | ICD-10-CM

## 2024-01-23 DIAGNOSIS — R2689 Other abnormalities of gait and mobility: Secondary | ICD-10-CM | POA: Diagnosis not present

## 2024-01-23 DIAGNOSIS — R600 Localized edema: Secondary | ICD-10-CM

## 2024-01-23 NOTE — Therapy (Signed)
 OUTPATIENT PHYSICAL THERAPY TREATMENT   Patient Name: Francisco Dixon MRN: [id number] DOB:1951-03-16, 73 y.o., male Today's Date: 01/23/2024     END OF SESSION:  PT End of Session - 01/23/24 1147     Visit Number 15    Number of Visits 17    Date for Recertification  02/01/24    Authorization Type medicare    Progress Note Due on Visit 20    PT Start Time 1147    PT Stop Time 1230    PT Time Calculation (min) 43 min            Past Medical History:  Diagnosis Date   COVID-19 (12/16/2020) 12/17/2020   GERD (gastroesophageal reflux disease)    Hypercholesterolemia    Hypertension    labile    Plantar fasciitis, right 12/07/2016   Vitamin D  deficiency    Past Surgical History:  Procedure Laterality Date   COLONOSCOPY N/A 2002,2007,2012   ROTATOR CUFF REPAIR Right 2015   Patient Active Problem List   Diagnosis Date Noted   Primary osteoarthritis of left knee 07/13/2023   Hearing loss 07/09/2023   Hemangioma flammeus 07/09/2023   IFG (impaired fasting glucose) 07/09/2023   B12 deficiency 11/12/2018   Numbness and tingling of both feet 11/27/2017   Major depression in full remission 07/09/2017   Medication management 07/07/2017   Abnormal glucose 07/07/2017   Allergy 07/07/2017   Labile hypertension 02/26/2016   Hyperlipidemia, mixed 02/26/2016   Vitamin D  deficiency 02/26/2016    PCP: Alvan Dorothyann BIRCH, MD  REFERRING PROVIDER: Yvone Rush, MD  REFERRING DIAG: [phone] (ICD-10-CM) - History of total left knee replacement (TKR)  THERAPY DIAG:  Left knee pain, unspecified chronicity  Stiffness of left knee, not elsewhere classified  Other abnormalities of gait and mobility  Localized edema  Rationale for Evaluation and Treatment: Rehabilitation  ONSET DATE: s/p L TKA DOS 8/20   SUBJECTIVE:   SUBJECTIVE STATEMENT:  01/23/2024: notes some increase in pain and swelling yesterday which he attributes to exercise class and PT in same day. Got better  as day went on, back to baseline today. No other new updates, preparing for his travels to [place]  coming up.    EVAL: Spouse currently assisting heavily with self care, housework. Has been working on exercises since surgery, feels pain but has been very adherent with them.  Prior to surgery pt was limited somewhat due to pain, but states he was doing yardwork, modified exercise classes 3x/week (stretching, aerobics, resistance training). Enjoys golfing, hiking, biking but was limited prior to surgery and using cane for a few weeks due to pain.  Is traveling to see daughter in beginning of October and wants to be active for that.   PERTINENT HISTORY: GERD, HTN PAIN:  Are you having pain: none, just soreness  Per eval:  Location/description: L knee, anterior and posterior Best-worst: 3-8/10; will tend to settle quickly   - aggravating factors: transfers, walking, stairs, housework, stretching - Easing factors: icing, movement    PRECAUTIONS: None  RED FLAGS: None Reports some nausea/dizziness w/ medications, improving No calf pain, fevers/chills, SOB Reports good compliance w/ post op aspirin   WEIGHT BEARING RESTRICTIONS: No  FALLS:  Has patient fallen in last 6 months? Yes. Number of falls 1 fall, walking puppy, denies injury, denies issues with balance  LIVING ENVIRONMENT: 1 story; entry through basement , 13 steps 1 rail to main level; front entrance 3+4 STE 1 rail with sloped entry Lives w/ wife who is  also retired  OCCUPATION: retired - was a Building services engineer for 40 years in [place]    PLOF: Independent  PATIENT GOALS: take trip to see daughter, wants to get back to normal and be more active again (hiking, golfing, biking)   NEXT MD VISIT: November 2025  OBJECTIVE:  Note: Objective measures were completed at Evaluation unless otherwise noted.  DIAGNOSTIC FINDINGS:  S/p L TKA DOS 8/20   PATIENT SURVEYS:  LEFS: 6/80  01/07/24 LEFS 33/80   COGNITION: Overall  cognitive status: Within functional limits for tasks assessed     EDEMA/INSPECTION:  Incision obscured by dressing and compression stocking, no calf pain or swelling, no TTP about calf, no pain w/ DF; no visible drainage  01/07/24: - incision well appearing overall, distally does have ~ 2.5 cm portion that is scabbed/reddish (remainder of incision pink and closed without scabbing) but no surrounding erythema or drainage. Pt states this has remained unchanged since removing bandage Saturday   LOWER EXTREMITY ROM:      Right eval Left eval Left 12/14/23 Left 12/19/23 Left 12/27/23 Left 12/31/23 Left 01/07/24 Left 01/16/24  Hip flexion          Hip extension          Hip internal rotation          Hip external rotation          Knee extension  A: lacking 8 deg w/ towel under calf  AROM 5 deg AROM 5 deg  AROM lacking 3 deg AROM lacking 3-4 deg without prop  Full with prop    Knee flexion  P: 70-72 deg AAROM 90 deg AAROM 101 deg AAROM 109 on bike  AROM supine 114 deg AROM 117 deg supine AROM 118 deg supine    (Blank rows = not tested) (Key: WFL = within functional limits not formally assessed, * = concordant pain, s = stiffness/stretching sensation, NT = not tested)  Comments:    LOWER EXTREMITY MMT:    MMT Right eval Left eval  Hip flexion    Hip abduction (modified sitting)    Hip internal rotation    Hip external rotation    Knee flexion    Knee extension    Ankle dorsiflexion     (Blank rows = not tested) (Key: WFL = within functional limits not formally assessed, * = concordant pain, s = stiffness/stretching sensation, NT = not tested)  Comments: deferred on eval given acuity of surgery   FUNCTIONAL TESTS:  TUG: 25.13 w RW; weight shift to RLE during transfer, BIL UE from RW   01/07/24: TUG 12.01sec no AD but significant alteration in kinematics; 10.56sec w SPC improved mechanics   GAIT: Distance walked: within clinic Assistive device utilized: Environmental consultant - 2  wheeled Level of assistance: Modified independence Comments: step to pattern leading w/ LLE, discontinuous RW management, reduced knee ROM throughout all phases    [facility] Adult PT Treatment:                                                DATE: 01/23/24 Therapeutic Exercise: Recumbent bike 6 min during subjective for ROM  Hip hike/drop off step 2x10 BIL  HEP discussion/education  Manual Therapy: Seated; L patellar mobs all directions within pt tolerance (grade 2-2); gentle scar mobility proximal third of scar Superior patellar mobs + 15 quad sets  Neuromuscular re-ed: Fwd step down 4 inch 2x8 LLE  Lateral step up/down 4 inch 2x8 LLE  Seated quad set x15; tactile/verbal cues as needed  to minimize glute compensations  Self Care: Education/discussion re: symptom behavior, activity modification, gradual progression, load management as pertains to his exercise classes   [facility] Adult PT Treatment:                                                DATE: 01/21/24 Therapeutic Exercise: Recumbent bike 6 min during subjective for ROM  Standing hip hike/drop off step, 2x6 LLE cues for mechanics  HEP update + education/handout  Neuromuscular re-ed: Seated quad set; x15; self tactile cues to maximize quad activation and mitigate posterior hip activation, visual demonstration Supine quad set x15 tactile cues as above Standing glute set (pushing into step, mini lunge position) x15 LLE  Standing GB TKE x10 emphasis on avoiding posterior hip compensations, leg positioned in hip ext  Therapeutic Activity: Obstacle course 4 laps no AD (2 inch step over, 4 cone stepovers (step to), lateral step over DB x2) CGA 2 inch fwd step down 2x12 LLE stance   [facility] Adult PT Treatment:                                                DATE: 01/16/24 Therapeutic Exercise: Recumbent bike 5 min during subjective for ROM  Supine hamstring curl w pball x12 Supine pball mini bridge hold 30sec Supine pball longer lever  mini bridge hold 2x15sec  Standing TKE ball at wall, 10sec hold,   Neuromuscular re-ed: SLS on theraband foam; 2x30sec LLE UE support PRN, CGA LLE stance; RLE fwd/retro step; ~30deg knee bend x10 LLE stance; RLE fwd/retro step; knee extended x10  LLE stance; RLE fwd/retro step; knee relaxed x10 Cone stepovers no AD step to pattern 6 laps in total, cues for posture, quad activation in stance, reduced frontal plane compensations Supine/seated quad sets practice reps w/ tactile cues at quad and hip to minimize hip extensor compensations, encouraged performing HEP this way                                                                  PATIENT EDUCATION:  Education details: Updated HEP Person educated: Patient Education method: Explanation, Demonstration, Tactile cues, Verbal cues Education comprehension: verbalized understanding, returned demonstration, verbal cues required, tactile cues required, and needs further education    HOME EXERCISE PROGRAM: Access Code: [email or web address] URL: [email or web address] Date: 01/21/2024 Prepared by: Alm Jenny  Exercises - Prone Knee Flexion  - 2-3 x daily - 1 sets - 10 reps - Mini Squat with Counter Support  - 2-3 x daily - 1 sets - 8 reps - Seated Quad Set  - 2-3 x daily - 1 sets - 10 reps - Prone Terminal Knee Extension  - 2-3 x daily - 1 sets - 10 reps - Standing Terminal Knee Extension at Wall with Ball  - 2-3 x daily - 1 sets - 10 reps -  5 sec hold - Standing Lateral Step-Down Heel Tap  - 1 x daily - 7 x weekly - 3 sets - 10 reps - Forward Step Down with Counter Support at Side  - 2-3 x daily - 1 sets - 8 reps - Standing Terminal Knee Extension with Resistance  - 2-3 x daily - 1 sets - 10 reps  ASSESSMENT:  CLINICAL IMPRESSION:  01/23/2024: Pt arrives w/ report of some exacerbation yesterday but back to baseline today. Education on activity modification and gradual progression as above, load management w/ exercise classes.  Continuing to work on [employer], improved performance functionally today but still has difficulty w/ isolated work. Noted patellar and scar hypomobility which is addressed with manual, some transient improvement noted. No adverse events, denies any pain on departure. Recommend continuing along current POC in order to address relevant deficits and improve functional tolerance. Pt departs today's session in no acute distress, all voiced questions/concerns addressed appropriately from PT perspective.     EVAL: Patient is a pleasant 73 y.o. gentleman who was seen today for physical therapy evaluation and treatment for L TKA DOS 8/20. Pt endorses difficulty w/ ADLs and functional mobility as expected for post surgical status. No red flags today. On exam he demonstrates concordant deficits in L knee ROM, altered gait/transfer mechanics, post op edema. TUG is indicative of reduced mobility and fall risk. Tolerates exam/HEP well overall, no adverse events, self care education as above. Recommend skilled PT to address aforementioned deficits with aim of improving functional tolerance and reducing pain with typical activities. Pt departs today's session in no acute distress, all voiced concerns/questions addressed appropriately from PT perspective.     OBJECTIVE IMPAIRMENTS: Abnormal gait, decreased activity tolerance, decreased balance, decreased endurance, decreased mobility, difficulty walking, decreased ROM, decreased strength, increased edema, impaired perceived functional ability, improper body mechanics, and pain.   ACTIVITY LIMITATIONS: carrying, lifting, sitting, standing, squatting, stairs, transfers, and locomotion level  PARTICIPATION LIMITATIONS: meal prep, cleaning, laundry, and community activity  PERSONAL FACTORS: Age, Time since onset of injury/illness/exacerbation, and 1-2 comorbidities: GERD, HTN are also affecting patient's functional outcome.   REHAB POTENTIAL: Good  CLINICAL  DECISION MAKING: Stable/uncomplicated  EVALUATION COMPLEXITY: Low   GOALS:  SHORT TERM GOALS: Target date: 01/04/2024  Pt will demonstrate appropriate understanding and performance of initially prescribed HEP in order to facilitate improved independence with management of symptoms.  Baseline: HEP established  01/02/24: reports good HEP performance Goal status: MET  2. Pt will report at least 25% improvement in overall pain levels over past week in order to facilitate improved tolerance to typical daily activities.   Baseline: 3-8/10 01/07/24: 0-2/10 Goal status: MET  LONG TERM GOALS: Target date: 02/01/2024   Pt will score 45 or greater on LEFS in order to demonstrate improved perception of function due to symptoms (MCID 9 pts) Baseline: 6/80 01/07/24: 33/80 Goal status: PROGRESSING  2.  Pt will demonstrate at least 0-110 degrees of knee AROM on surgical limb in order to facilitate improved tolerance to functional movements such as squatting, walking, and stair navigation.  Baseline: see ROM chart above 01/07/24: see ROM chart above  Goal status: PARTIALLY MET  3.  Pt will demonstrate appropriate performance of final prescribed HEP in order to facilitate improved self-management of symptoms post-discharge.   Baseline: initial HEP prescribed   01/07/24: reports good HEP performance  Goal status: ONGOING  4.  Pt will be able to perform TUG in less than or equal to 13 sec in  order to indicate reduced risk of falling (cutoff score for fall risk 13.5 sec in community dwelling older adults per [facility] et al, 2000)  Baseline: 25sec RW  01/07/24: 10-12sec (SPC vs no AD respectively  Goal status: MET   5. Pt will report at least 50% decrease in overall pain levels in past week in order to facilitate improved tolerance to basic ADLs/mobility.   Baseline: 3-8/10  01/07/24: 0-2/10  Goal status: MET  6. Pt will demonstrate symmetrical knee MMT between surgical and non-surgical limb in  order to facilitate improved functional strength and mechanics.  Baseline: deferred on eval given proximity to surgery  01/07/24: deferred given proximity to surgery  Goal status: ONGOING    PLAN:  PT FREQUENCY: 2x/week  PT DURATION: 8 weeks  PLANNED INTERVENTIONS: 97164- PT Re-evaluation, 97750- Physical Performance Testing, 97110-Therapeutic exercises, 97530- Therapeutic activity, [id number]- Neuromuscular re-education, 97535- Self Care, [id number]- Manual therapy, [id number]- Gait training, [phone]- Electrical stimulation (unattended), 97016- Vasopneumatic device, 20560 (1-2 muscles), 20561 (3+ muscles)- Dry Needling, Patient/Family education, Balance training, Stair training, Taping, Joint mobilization, Scar mobilization, Cryotherapy, and Moist heat  PLAN FOR NEXT SESSION: Review/update HEP PRN. Work on [employer] exercises as appropriate with emphasis on quad activation, knee mobility, functional mechanics, edema management. Gait training,  knee flexion on swing phase; progress AD as able. Symptom modification strategies as indicated/appropriate.    Alm DELENA Jenny PT, DPT 01/23/2024 12:36 PM

## 2024-02-05 ENCOUNTER — Ambulatory Visit: Admitting: Physical Therapy

## 2024-02-05 ENCOUNTER — Encounter: Payer: Self-pay | Admitting: Physical Therapy

## 2024-02-05 DIAGNOSIS — M25562 Pain in left knee: Secondary | ICD-10-CM | POA: Diagnosis not present

## 2024-02-05 DIAGNOSIS — R2689 Other abnormalities of gait and mobility: Secondary | ICD-10-CM

## 2024-02-05 DIAGNOSIS — R600 Localized edema: Secondary | ICD-10-CM | POA: Diagnosis not present

## 2024-02-05 DIAGNOSIS — M25662 Stiffness of left knee, not elsewhere classified: Secondary | ICD-10-CM | POA: Diagnosis not present

## 2024-02-05 NOTE — Therapy (Signed)
 OUTPATIENT PHYSICAL THERAPY PROGRESS NOTE + RECERTIFICATION   Patient Name: Francisco Dixon MRN: [id number] DOB:October 23, 1950, 73 y.o., male Today's Date: 02/05/2024   Progress Note Reporting Period 12/07/23 to 02/05/24  See note below for Objective Data and Assessment of Progress/Goals.   END OF SESSION:  PT End of Session - 02/05/24 1058     Visit Number 16    Number of Visits 20    Date for Recertification  03/04/24    Authorization Type medicare    Progress Note Due on Visit 26    PT Start Time 1059    PT Stop Time 1144    PT Time Calculation (min) 45 min             Past Medical History:  Diagnosis Date   COVID-19 (12/16/2020) 12/17/2020   GERD (gastroesophageal reflux disease)    Hypercholesterolemia    Hypertension    labile    Plantar fasciitis, right 12/07/2016   Vitamin D  deficiency    Past Surgical History:  Procedure Laterality Date   COLONOSCOPY N/A 2002,2007,2012   ROTATOR CUFF REPAIR Right 2015   Patient Active Problem List   Diagnosis Date Noted   Primary osteoarthritis of left knee 07/13/2023   Hearing loss 07/09/2023   Hemangioma flammeus 07/09/2023   IFG (impaired fasting glucose) 07/09/2023   B12 deficiency 11/12/2018   Numbness and tingling of both feet 11/27/2017   Major depression in full remission 07/09/2017   Medication management 07/07/2017   Abnormal glucose 07/07/2017   Allergy 07/07/2017   Labile hypertension 02/26/2016   Hyperlipidemia, mixed 02/26/2016   Vitamin D  deficiency 02/26/2016    PCP: Alvan Dorothyann BIRCH, MD  REFERRING PROVIDER: Yvone Rush, MD  REFERRING DIAG: [phone] (ICD-10-CM) - History of total left knee replacement (TKR)  THERAPY DIAG:  Left knee pain, unspecified chronicity  Stiffness of left knee, not elsewhere classified  Other abnormalities of gait and mobility  Localized edema  Rationale for Evaluation and Treatment: Rehabilitation  ONSET DATE: s/p L TKA DOS 8/20   SUBJECTIVE:    SUBJECTIVE STATEMENT:  02/05/2024: left his cane in [place] , has been doing well overall. Did a lot of walking and did well. Still has the most trouble with standing in place, navigating stairs (descending). Did exercise classes yesterday and was active in yard - a bit sore in the knee today with it but not too bad. Plans to leave to visit [place] week after next    EVAL: Spouse currently assisting heavily with self care, housework. Has been working on exercises since surgery, feels pain but has been very adherent with them.  Prior to surgery pt was limited somewhat due to pain, but states he was doing yardwork, modified exercise classes 3x/week (stretching, aerobics, resistance training). Enjoys golfing, hiking, biking but was limited prior to surgery and using cane for a few weeks due to pain.  Is traveling to see daughter in beginning of October and wants to be active for that.   PERTINENT HISTORY: GERD, HTN PAIN:  Are you having pain: none, just soreness - no more than 2-3/10 in past week  Per eval:  Location/description: L knee, anterior and posterior Best-worst: 3-8/10; will tend to settle quickly   - aggravating factors: transfers, walking, stairs, housework, stretching - Easing factors: icing, movement    PRECAUTIONS: None  RED FLAGS: None Reports some nausea/dizziness w/ medications, improving No calf pain, fevers/chills, SOB Reports good compliance w/ post op aspirin   WEIGHT BEARING RESTRICTIONS: No  FALLS:  Has patient fallen in last 6 months? Yes. Number of falls 1 fall, walking puppy, denies injury, denies issues with balance  LIVING ENVIRONMENT: 1 story; entry through basement , 13 steps 1 rail to main level; front entrance 3+4 STE 1 rail with sloped entry Lives w/ wife who is also retired  OCCUPATION: retired - was a Building services engineer for 40 years in [place]    PLOF: Independent  PATIENT GOALS: take trip to see daughter, wants to get back to normal and be more  active again (hiking, golfing, biking)   NEXT MD VISIT: November 18th   OBJECTIVE:  Note: Objective measures were completed at Evaluation unless otherwise noted.  DIAGNOSTIC FINDINGS:  S/p L TKA DOS 8/20   PATIENT SURVEYS:  LEFS: 6/80  01/07/24 LEFS 33/80   02/05/24 LEFS deferred given technical difficulties   COGNITION: Overall cognitive status: Within functional limits for tasks assessed     EDEMA/INSPECTION:  Incision obscured by dressing and compression stocking, no calf pain or swelling, no TTP about calf, no pain w/ DF; no visible drainage  01/07/24: - incision well appearing overall, distally does have ~ 2.5 cm portion that is scabbed/reddish (remainder of incision pink and closed without scabbing) but no surrounding erythema or drainage. Pt states this has remained unchanged since removing bandage Saturday   LOWER EXTREMITY ROM:      Right eval Left eval Left 12/14/23 Left 12/19/23 Left 12/27/23 Left 12/31/23 Left 01/07/24 Left 01/16/24 L 02/05/24  Hip flexion           Hip extension           Hip internal rotation           Hip external rotation           Knee extension  A: lacking 8 deg w/ towel under calf  AROM 5 deg AROM 5 deg  AROM lacking 3 deg AROM lacking 3-4 deg without prop  Full with prop   Full   Knee flexion  P: 70-72 deg AAROM 90 deg AAROM 101 deg AAROM 109 on bike  AROM supine 114 deg AROM 117 deg supine AROM 118 deg supine   A: 121 deg   (Blank rows = not tested) (Key: WFL = within functional limits not formally assessed, * = concordant pain, s = stiffness/stretching sensation, NT = not tested)  Comments:   LOWER EXTREMITY MMT:    MMT Right 02/05/24 Left 02/05/24  Hip flexion    Hip abduction (modified sitting)    Hip internal rotation    Hip external rotation    Knee flexion 4+ 4+  Knee extension 4+ 4  Ankle dorsiflexion     (Blank rows = not tested) (Key: WFL = within functional limits not formally assessed, * = concordant pain, s =  stiffness/stretching sensation, NT = not tested)  Comments: deferred on eval given acuity of surgery   FUNCTIONAL TESTS:  TUG: 25.13 w RW; weight shift to RLE during transfer, BIL UE from RW   01/07/24: TUG 12.01sec no AD but significant alteration in kinematics; 10.56sec w SPC improved mechanics    02/05/24: 5xSTS 8 sec no UE support TUG 8 sec no AD    FUNCTIONAL GAIT ASSESSMENT:  ITEM 02/05/24  1 Gait Level Surface mild impairment 2   2 Change in Gait Speed Normal 3   3 Gait with Horizontal Head Turns Normal 3   4 Gait with Vertical Head Turns Normal 3   5 Gait with Pivot Turn  mild impairment 2   6 Step Over Obstacle mild impairment 2   7 Gait with Narrow Base of Support severe impairment 0 (able to recover LOB without assist aside from 1 instance requiring min A)  8 Gait with Eyes Closed mild impairment 2   9 Ambulating Backwards mild impairment 2   10 Steps Normal 3   Total: 22/30   * Score of <=22/30 indicates that patient is at increased risk for falls.   GAIT: Distance walked: within clinic Assistive device utilized: Walker - 2 wheeled Level of assistance: Modified independence Comments: step to pattern leading w/ LLE, discontinuous RW management, reduced knee ROM throughout all phases    [facility] Adult PT Treatment:                                                DATE: 02/05/24  Therapeutic Activity: MSK assessment + education 5xSTS + education TUG + education FGA + education Education/discussion re: progress with PT, symptom behavior as it affects activity tolerance, PT goals/POC, activity modification, safety w/ higher level tasks and travelling                                                              PATIENT EDUCATION:  Education details: rationale for interventions, PT goals/POC Person educated: Patient Education method: Explanation, Demonstration, Tactile cues, Verbal cues Education comprehension: verbalized understanding, returned demonstration,  verbal cues required, tactile cues required, and needs further education    HOME EXERCISE PROGRAM: Access Code: [email or web address] URL: [email or web address] Date: 01/21/2024 Prepared by: Alm Jenny  Exercises - Prone Knee Flexion  - 2-3 x daily - 1 sets - 10 reps - Mini Squat with Counter Support  - 2-3 x daily - 1 sets - 8 reps - Seated Quad Set  - 2-3 x daily - 1 sets - 10 reps - Prone Terminal Knee Extension  - 2-3 x daily - 1 sets - 10 reps - Standing Terminal Knee Extension at Wall with Ball  - 2-3 x daily - 1 sets - 10 reps - 5 sec hold - Standing Lateral Step-Down Heel Tap  - 1 x daily - 7 x weekly - 3 sets - 10 reps - Forward Step Down with Counter Support at Side  - 2-3 x daily - 1 sets - 8 reps - Standing Terminal Knee Extension with Resistance  - 2-3 x daily - 1 sets - 10 reps  ASSESSMENT:  CLINICAL IMPRESSION:  02/05/2024: Pt arrives w/ report of continued progress over his travels. ROM continuing to improve and has met LTG, MMT revealing mild quad weakness. Functionally continues to demonstrate difficulty dissociating quad/hip with gait, although mechanics are improved compared to last visit. Pt is inquisitive around increasing activity levels particularly with his travels, so we look at Functional Gait Assessment to assess higher level postural stability - score of 22/30 is at cutoff score for fall risk, although pt is able to recover LOB independently for majority of tasks (one instance of minA with narrow BOS). He does note history of neuropathy which affects balance at baseline. Education on activity modification, gradual progression based on response, and safety considerations. At this point,  recommend extension of POC with primary focus on normalizing gait mechanics and higher level postural stability to reduce fall risk. Pt verbalizes agreement/understanding w/ plan at this time. Pt departs today's session in no acute distress, all voiced questions/concerns addressed  appropriately from PT perspective.      EVAL: Patient is a pleasant 73 y.o. gentleman who was seen today for physical therapy evaluation and treatment for L TKA DOS 8/20. Pt endorses difficulty w/ ADLs and functional mobility as expected for post surgical status. No red flags today. On exam he demonstrates concordant deficits in L knee ROM, altered gait/transfer mechanics, post op edema. TUG is indicative of reduced mobility and fall risk. Tolerates exam/HEP well overall, no adverse events, self care education as above. Recommend skilled PT to address aforementioned deficits with aim of improving functional tolerance and reducing pain with typical activities. Pt departs today's session in no acute distress, all voiced concerns/questions addressed appropriately from PT perspective.     OBJECTIVE IMPAIRMENTS: Abnormal gait, decreased activity tolerance, decreased balance, decreased endurance, decreased mobility, difficulty walking, decreased ROM, decreased strength, increased edema, impaired perceived functional ability, improper body mechanics, and pain.   ACTIVITY LIMITATIONS: carrying, lifting, sitting, standing, squatting, stairs, transfers, and locomotion level  PARTICIPATION LIMITATIONS: meal prep, cleaning, laundry, and community activity  PERSONAL FACTORS: Age, Time since onset of injury/illness/exacerbation, and 1-2 comorbidities: GERD, HTN are also affecting patient's functional outcome.   REHAB POTENTIAL: Good  CLINICAL DECISION MAKING: Stable/uncomplicated  EVALUATION COMPLEXITY: Low   GOALS:  SHORT TERM GOALS: Target date: 01/04/2024  Pt will demonstrate appropriate understanding and performance of initially prescribed HEP in order to facilitate improved independence with management of symptoms.  Baseline: HEP established  01/02/24: reports good HEP performance Goal status: MET  2. Pt will report at least 25% improvement in overall pain levels over past week in order to  facilitate improved tolerance to typical daily activities.   Baseline: 3-8/10 01/07/24: 0-2/10 Goal status: MET  LONG TERM GOALS: Target date: 03/04/2024 (update 02/05/24)   Pt will score 45 or greater on LEFS in order to demonstrate improved perception of function due to symptoms (MCID 9 pts) Baseline: 6/80 01/07/24: 33/80 02/05/24: deferred given technical difficulties Goal status: ONGOING  2.  Pt will demonstrate at least 0-110 degrees of knee AROM on surgical limb in order to facilitate improved tolerance to functional movements such as squatting, walking, and stair navigation.  Baseline: see ROM chart above 01/07/24: see ROM chart above  02/05/24: 0-121 deg Goal status: MET  3.  Pt will demonstrate appropriate performance of final prescribed HEP in order to facilitate improved self-management of symptoms post-discharge.   Baseline: initial HEP prescribed   01/07/24: reports good HEP performance  02/05/24: reports good HEP adherence  Goal status: ONGOING  4.  Pt will be able to perform TUG in less than or equal to 13 sec in order to indicate reduced risk of falling (cutoff score for fall risk 13.5 sec in community dwelling older adults per [facility] et al, 2000)  Baseline: 25sec RW  01/07/24: 10-12sec (SPC vs no AD respectively  Goal status: MET   5. Pt will report at least 50% decrease in overall pain levels in past week in order to facilitate improved tolerance to basic ADLs/mobility.   Baseline: 3-8/10  01/07/24: 0-2/10  Goal status: MET  6. Pt will demonstrate symmetrical knee MMT between surgical and non-surgical limb in order to facilitate improved functional strength and mechanics.  Baseline: deferred on eval given proximity  to surgery  01/07/24: deferred given proximity to surgery  02/05/24: mild quad weakness  Goal status: PROGRESSING  7. Pt will score greater than or equal to 26/30 on Functional Gait assessment in order to indicate reduced fall risk (cutoff score  </= 22/30 predictive of falls per Willye et al 2010, MCID 4 pts [place]in[place] et al 2014)  Baseline: 22/30  Goal status: INITIAL/NEW 02/05/24   PLAN: (updated 02/05/24)  PT FREQUENCY: 1-2x/week  PT DURATION: 4 weeks  PLANNED INTERVENTIONS: 97164- PT Re-evaluation, 97750- Physical Performance Testing, 97110-Therapeutic exercises, 97530- Therapeutic activity, [id number]- Neuromuscular re-education, 97535- Self Care, [id number]- Manual therapy, [id number]- Gait training, [id number]- Electrical stimulation (unattended), 97016- Vasopneumatic device, 20560 (1-2 muscles), 20561 (3+ muscles)- Dry Needling, Patient/Family education, Balance training, Stair training, Taping, Joint mobilization, Scar mobilization, Cryotherapy, and Moist heat  PLAN FOR NEXT SESSION: Review/update HEP PRN. Focus on quad/hip dissociation, gait mechanics, higher level postural stability. Symptom modification strategies as indicated/appropriate.    Alm DELENA Jenny PT, DPT 02/05/2024 12:44 PM

## 2024-02-07 ENCOUNTER — Ambulatory Visit: Admitting: Physical Therapy

## 2024-02-07 ENCOUNTER — Encounter: Payer: Self-pay | Admitting: Physical Therapy

## 2024-02-07 DIAGNOSIS — M25662 Stiffness of left knee, not elsewhere classified: Secondary | ICD-10-CM

## 2024-02-07 DIAGNOSIS — R2689 Other abnormalities of gait and mobility: Secondary | ICD-10-CM

## 2024-02-07 DIAGNOSIS — R600 Localized edema: Secondary | ICD-10-CM | POA: Diagnosis not present

## 2024-02-07 DIAGNOSIS — M25562 Pain in left knee: Secondary | ICD-10-CM | POA: Diagnosis not present

## 2024-02-07 NOTE — Therapy (Signed)
 OUTPATIENT PHYSICAL THERAPY TREATMENT   Patient Name: Francisco Dixon MRN: [id number] DOB:[birth year], 73 y.o., male Today's Date: 02/07/2024   END OF SESSION:  PT End of Session - 02/07/24 1102     Visit Number 17    Number of Visits 20    Date for Recertification  03/04/24    Authorization Type medicare    Progress Note Due on Visit 26    PT Start Time 1102    PT Stop Time 1145    PT Time Calculation (min) 43 min              Past Medical History:  Diagnosis Date   COVID-19 (12/16/2020) 12/17/2020   GERD (gastroesophageal reflux disease)    Hypercholesterolemia    Hypertension    labile    Plantar fasciitis, right 12/07/2016   Vitamin D  deficiency    Past Surgical History:  Procedure Laterality Date   COLONOSCOPY N/A 2002,2007,2012   ROTATOR CUFF REPAIR Right 2015   Patient Active Problem List   Diagnosis Date Noted   Primary osteoarthritis of left knee 07/13/2023   Hearing loss 07/09/2023   Hemangioma flammeus 07/09/2023   IFG (impaired fasting glucose) 07/09/2023   B12 deficiency 11/12/2018   Numbness and tingling of both feet 11/27/2017   Major depression in full remission 07/09/2017   Medication management 07/07/2017   Abnormal glucose 07/07/2017   Allergy 07/07/2017   Labile hypertension 02/26/2016   Hyperlipidemia, mixed 02/26/2016   Vitamin D  deficiency 02/26/2016    PCP: Alvan Dorothyann BIRCH, MD  REFERRING PROVIDER: Yvone Rush, MD  REFERRING DIAG: [phone] (ICD-10-CM) - History of total left knee replacement (TKR)  THERAPY DIAG:  Left knee pain, unspecified chronicity  Stiffness of left knee, not elsewhere classified  Other abnormalities of gait and mobility  Rationale for Evaluation and Treatment: Rehabilitation  ONSET DATE: s/p L TKA DOS 8/20   SUBJECTIVE:   SUBJECTIVE STATEMENT:  02/07/2024: went out for a walk this morning (1.79miles) and did well. Exercise classes yesterday, did well after last session. A little sore, rated  2/10      EVAL: Spouse currently assisting heavily with self care, housework. Has been working on exercises since surgery, feels pain but has been very adherent with them.  Prior to surgery pt was limited somewhat due to pain, but states he was doing yardwork, modified exercise classes 3x/week (stretching, aerobics, resistance training). Enjoys golfing, hiking, biking but was limited prior to surgery and using cane for a few weeks due to pain.  Is traveling to see daughter in beginning of October and wants to be active for that.   PERTINENT HISTORY: GERD, HTN PAIN:  Are you having pain: 2/10 L knee described as soreness - no more than 2-3/10 in past week  Per eval:  Location/description: L knee, anterior and posterior Best-worst: 3-8/10; will tend to settle quickly   - aggravating factors: transfers, walking, stairs, housework, stretching - Easing factors: icing, movement    PRECAUTIONS: None  RED FLAGS: None Reports some nausea/dizziness w/ medications, improving No calf pain, fevers/chills, SOB Reports good compliance w/ post op aspirin   WEIGHT BEARING RESTRICTIONS: No  FALLS:  Has patient fallen in last 6 months? Yes. Number of falls 1 fall, walking puppy, denies injury, denies issues with balance  LIVING ENVIRONMENT: 1 story; entry through basement , 13 steps 1 rail to main level; front entrance 3+4 STE 1 rail with sloped entry Lives w/ wife who is also retired  OCCUPATION: retired - was  a forester for 40 years in [place]    PLOF: Independent  PATIENT GOALS: take trip to see daughter, wants to get back to normal and be more active again (hiking, golfing, biking)   NEXT MD VISIT: November 18th   OBJECTIVE:  Note: Objective measures were completed at Evaluation unless otherwise noted.  DIAGNOSTIC FINDINGS:  S/p L TKA DOS 8/20   PATIENT SURVEYS:  LEFS: 6/80  01/07/24 LEFS 33/80   02/05/24 LEFS deferred given technical difficulties    COGNITION: Overall cognitive status: Within functional limits for tasks assessed     EDEMA/INSPECTION:  Incision obscured by dressing and compression stocking, no calf pain or swelling, no TTP about calf, no pain w/ DF; no visible drainage  01/07/24: - incision well appearing overall, distally does have ~ 2.5 cm portion that is scabbed/reddish (remainder of incision pink and closed without scabbing) but no surrounding erythema or drainage. Pt states this has remained unchanged since removing bandage Saturday   LOWER EXTREMITY ROM:      Right eval Left eval Left 12/14/23 Left 12/19/23 Left 12/27/23 Left 12/31/23 Left 01/07/24 Left 01/16/24 L 02/05/24  Hip flexion           Hip extension           Hip internal rotation           Hip external rotation           Knee extension  A: lacking 8 deg w/ towel under calf  AROM 5 deg AROM 5 deg  AROM lacking 3 deg AROM lacking 3-4 deg without prop  Full with prop   Full   Knee flexion  P: 70-72 deg AAROM 90 deg AAROM 101 deg AAROM 109 on bike  AROM supine 114 deg AROM 117 deg supine AROM 118 deg supine   A: 121 deg   (Blank rows = not tested) (Key: WFL = within functional limits not formally assessed, * = concordant pain, s = stiffness/stretching sensation, NT = not tested)  Comments:   LOWER EXTREMITY MMT:    MMT Right 02/05/24 Left 02/05/24  Hip flexion    Hip abduction (modified sitting)    Hip internal rotation    Hip external rotation    Knee flexion 4+ 4+  Knee extension 4+ 4  Ankle dorsiflexion     (Blank rows = not tested) (Key: WFL = within functional limits not formally assessed, * = concordant pain, s = stiffness/stretching sensation, NT = not tested)  Comments: deferred on eval given acuity of surgery   FUNCTIONAL TESTS:  TUG: 25.13 w RW; weight shift to RLE during transfer, BIL UE from RW   01/07/24: TUG 12.01sec no AD but significant alteration in kinematics; 10.56sec w SPC improved mechanics    02/05/24: 5xSTS  8 sec no UE support TUG 8 sec no AD    FUNCTIONAL GAIT ASSESSMENT:  ITEM 02/05/24  1 Gait Level Surface mild impairment 2   2 Change in Gait Speed Normal 3   3 Gait with Horizontal Head Turns Normal 3   4 Gait with Vertical Head Turns Normal 3   5 Gait with Pivot Turn mild impairment 2   6 Step Over Obstacle mild impairment 2   7 Gait with Narrow Base of Support severe impairment 0 (able to recover LOB without assist aside from 1 instance requiring min A)  8 Gait with Eyes Closed mild impairment 2   9 Ambulating Backwards mild impairment 2   10 Steps  Normal 3   Total: 22/30   * Score of <=22/30 indicates that patient is at increased risk for falls.   GAIT: Distance walked: within clinic Assistive device utilized: Environmental consultant - 2 wheeled Level of assistance: Modified independence Comments: step to pattern leading w/ LLE, discontinuous RW management, reduced knee ROM throughout all phases    [facility] Adult PT Treatment:                                                DATE: 02/07/24  Neuromuscular re-ed: Bosu lateral step up x15 LLE cues for quad activation and stability Bosu fwd step up x15 LLE emphasis on quad control  Reactive balance w/ perturbations (strong band at hips, posterior force multidirectional) 4 laps (40ft each) Reactive balance (strong band at hips anterior force) 2 laps   Therapeutic Activity: Recumbent bike 5 min warmup  Farmer carry 10# BIL 2x71ft  10# suitcase carry 2x3ft BIL (improved gait mechanics carrying on L compared to R) 8 inch fwd step up x10 8 inch lateral step up x5 LLE     [facility] Adult PT Treatment:                                                DATE: 02/05/24  Therapeutic Activity: MSK assessment + education 5xSTS + education TUG + education FGA + education Education/discussion re: progress with PT, symptom behavior as it affects activity tolerance, PT goals/POC, activity modification, safety w/ higher level tasks and travelling                                                               PATIENT EDUCATION:  Education details: rationale for interventions, HEP Person educated: Patient Education method: Explanation, Demonstration, Tactile cues, Verbal cues Education comprehension: verbalized understanding, returned demonstration, verbal cues required, tactile cues required, and needs further education    HOME EXERCISE PROGRAM: Access Code: [email or web address] URL: [email or web address] Date: 02/07/2024 Prepared by: Alm Jenny  Exercises - Prone Knee Flexion  - 2-3 x daily - 1 sets - 10 reps - Mini Squat with Counter Support  - 2-3 x daily - 1 sets - 8 reps - Seated Quad Set  - 2-3 x daily - 1 sets - 10 reps - Prone Terminal Knee Extension  - 2-3 x daily - 1 sets - 10 reps - Standing Terminal Knee Extension at Wall with Ball  - 2-3 x daily - 1 sets - 10 reps - 5 sec hold - Standing Terminal Knee Extension with Resistance  - 2-3 x daily - 1 sets - 10 reps - Standing Lateral Step-Down Heel Tap  - 1 x daily - 7 x weekly - 3 sets - 10 reps - Forward Step Down with Counter Support at Side  - 2-3 x daily - 1 sets - 8 reps - Runner's Step Up on BOSU Ball  - 1 x daily - 7 x weekly - 2 sets - 10 reps - Side Stepping with Counter Support  - 1 x daily - 7  x weekly - 2 sets - 10 reps - Backward Walking with Counter Support  - 1 x daily - 7 x weekly - 2 sets - 10 reps  ASSESSMENT:  CLINICAL IMPRESSION:  02/07/2024: Pt arrives w/ report of some soreness w/ increased activity at home and exercise class. Today focusing on postural stability and functional strengthening. Noted improvement in quad activation with bosu step ups (fwd and lateral), emphasizing eccentric control. Significant improvement in quad avoidance w/ step up patterns today. Also increasing complexity w/ multiplanar movements to work towards higher level tasks. Tolerates session well with some muscular fatigue but no increase in pain, no adverse events. Recommend  continuing along current POC in order to address relevant deficits and improve functional tolerance. Pt departs today's session in no acute distress, all voiced questions/concerns addressed appropriately from PT perspective.     EVAL: Patient is a pleasant 74 y.o. gentleman who was seen today for physical therapy evaluation and treatment for L TKA DOS 8/20. Pt endorses difficulty w/ ADLs and functional mobility as expected for post surgical status. No red flags today. On exam he demonstrates concordant deficits in L knee ROM, altered gait/transfer mechanics, post op edema. TUG is indicative of reduced mobility and fall risk. Tolerates exam/HEP well overall, no adverse events, self care education as above. Recommend skilled PT to address aforementioned deficits with aim of improving functional tolerance and reducing pain with typical activities. Pt departs today's session in no acute distress, all voiced concerns/questions addressed appropriately from PT perspective.     OBJECTIVE IMPAIRMENTS: Abnormal gait, decreased activity tolerance, decreased balance, decreased endurance, decreased mobility, difficulty walking, decreased ROM, decreased strength, increased edema, impaired perceived functional ability, improper body mechanics, and pain.   ACTIVITY LIMITATIONS: carrying, lifting, sitting, standing, squatting, stairs, transfers, and locomotion level  PARTICIPATION LIMITATIONS: meal prep, cleaning, laundry, and community activity  PERSONAL FACTORS: Age, Time since onset of injury/illness/exacerbation, and 1-2 comorbidities: GERD, HTN are also affecting patient's functional outcome.   REHAB POTENTIAL: Good  CLINICAL DECISION MAKING: Stable/uncomplicated  EVALUATION COMPLEXITY: Low   GOALS:  SHORT TERM GOALS: Target date: 01/04/2024  Pt will demonstrate appropriate understanding and performance of initially prescribed HEP in order to facilitate improved independence with management of symptoms.   Baseline: HEP established  01/02/24: reports good HEP performance Goal status: MET  2. Pt will report at least 25% improvement in overall pain levels over past week in order to facilitate improved tolerance to typical daily activities.   Baseline: 3-8/10 01/07/24: 0-2/10 Goal status: MET  LONG TERM GOALS: Target date: 03/04/2024 (update 02/05/24)   Pt will score 45 or greater on LEFS in order to demonstrate improved perception of function due to symptoms (MCID 9 pts) Baseline: 6/80 01/07/24: 33/80 02/05/24: deferred given technical difficulties Goal status: ONGOING  2.  Pt will demonstrate at least 0-110 degrees of knee AROM on surgical limb in order to facilitate improved tolerance to functional movements such as squatting, walking, and stair navigation.  Baseline: see ROM chart above 01/07/24: see ROM chart above  02/05/24: 0-121 deg Goal status: MET  3.  Pt will demonstrate appropriate performance of final prescribed HEP in order to facilitate improved self-management of symptoms post-discharge.   Baseline: initial HEP prescribed   01/07/24: reports good HEP performance  02/05/24: reports good HEP adherence  Goal status: ONGOING  4.  Pt will be able to perform TUG in less than or equal to 13 sec in order to indicate reduced risk of falling (cutoff score  for fall risk 13.5 sec in community dwelling older adults per [facility] et al, 2000)  Baseline: 25sec RW  01/07/24: 10-12sec (SPC vs no AD respectively  Goal status: MET   5. Pt will report at least 50% decrease in overall pain levels in past week in order to facilitate improved tolerance to basic ADLs/mobility.   Baseline: 3-8/10  01/07/24: 0-2/10  Goal status: MET  6. Pt will demonstrate symmetrical knee MMT between surgical and non-surgical limb in order to facilitate improved functional strength and mechanics.  Baseline: deferred on eval given proximity to surgery  01/07/24: deferred given proximity to surgery  02/05/24:  mild quad weakness  Goal status: PROGRESSING  7. Pt will score greater than or equal to 26/30 on Functional Gait assessment in order to indicate reduced fall risk (cutoff score </= 22/30 predictive of falls per Willye et al 2010, MCID 4 pts Beninato et al 2014)  Baseline: 22/30  Goal status: INITIAL/NEW 02/05/24   PLAN: (updated 02/05/24)  PT FREQUENCY: 1-2x/week  PT DURATION: 4 weeks  PLANNED INTERVENTIONS: 97164- PT Re-evaluation, 97750- Physical Performance Testing, 97110-Therapeutic exercises, 97530- Therapeutic activity, [id number]- Neuromuscular re-education, 97535- Self Care, [id number]- Manual therapy, [id number]- Gait training, H9716- Electrical stimulation (unattended), 97016- Vasopneumatic device, 20560 (1-2 muscles), 20561 (3+ muscles)- Dry Needling, Patient/Family education, Balance training, Stair training, Taping, Joint mobilization, Scar mobilization, Cryotherapy, and Moist heat  PLAN FOR NEXT SESSION: Review/update HEP PRN. Focus on quad/hip dissociation, gait mechanics, higher level postural stability. Symptom modification strategies as indicated/appropriate.    Alm DELENA Jenny PT, DPT 02/07/2024 12:43 PM

## 2024-02-12 ENCOUNTER — Ambulatory Visit: Payer: Self-pay | Admitting: Physical Therapy

## 2024-02-13 ENCOUNTER — Ambulatory Visit: Payer: Self-pay

## 2024-02-13 NOTE — Telephone Encounter (Signed)
 FYI Only or Action Required?: FYI only for provider: appointment scheduled on 02/14/2024 at 1pm with patient's PCP Dr Dorothyann Byars.  Patient was last seen in primary care on 11/05/2023 by [name], MD.  Called Nurse Triage reporting Fatigue.  Symptoms began 2-3 months ago.  Interventions attempted: Rest, hydration, or home remedies.  Symptoms are: gradually worsening.  Triage Disposition: See Physician Within 24 Hours  Patient/caregiver understands and will follow disposition?: Yes               Copied from CRM #[id number]. Topic: Clinical - Red Word Triage >> Feb 13, 2024 10:35 AM Francisco Dixon wrote: Red Word that prompted transfer to Nurse Triage: The patient reports experiencing severe fatigue, sleeping up to 10 hours per night, with symptoms persisting for several months Reason for Disposition  [1] MODERATE weakness (e.g., interferes with work, school, normal activities) AND [2] persists > 3 days  Answer Assessment - Initial Assessment Questions Sleeps 9-10 hours---no trouble sleeping Wakes up feeling okay By noon---ready for a nap Denies any other symptoms---just feels tired and has low energy  Patient is advised to call us  back if anything changes or with any further questions/concerns. Patient is advised that if anything worsens to go to the Emergency Room. Patient verbalized understanding.     1. DESCRIPTION: Describe how you are feeling.     Just tired 2. SEVERITY: How bad is it?  Can you stand and walk?     Feels okay to walk--states he is functioning fine 3. ONSET: When did these symptoms begin? (e.g., hours, days, weeks, months)     2-3 months 4. CAUSE: What do you think is causing the weakness or fatigue? (e.g., not drinking enough fluids, medical problem, trouble sleeping)     unsure 5. NEW MEDICINES:  Have you started on any new medicines recently? (e.g., opioid pain medicines, benzodiazepines, muscle relaxants,  antidepressants, antihistamines, neuroleptics, beta blockers)     denies 6. OTHER SYMPTOMS: Do you have any other symptoms? (e.g., chest pain, fever, cough, SOB, vomiting, diarrhea, bleeding, other areas of pain)     denies  Protocols used: Weakness (Generalized) and Fatigue-A-AH

## 2024-02-13 NOTE — Telephone Encounter (Signed)
Pt scheduled for OV tomorrow.  

## 2024-02-14 ENCOUNTER — Ambulatory Visit (INDEPENDENT_AMBULATORY_CARE_PROVIDER_SITE_OTHER): Admitting: Family Medicine

## 2024-02-14 ENCOUNTER — Encounter: Payer: Self-pay | Admitting: Family Medicine

## 2024-02-14 VITALS — BP 130/71 | HR 83 | Ht 67.0 in | Wt 165.0 lb

## 2024-02-14 DIAGNOSIS — E559 Vitamin D deficiency, unspecified: Secondary | ICD-10-CM

## 2024-02-14 DIAGNOSIS — E538 Deficiency of other specified B group vitamins: Secondary | ICD-10-CM | POA: Diagnosis not present

## 2024-02-14 DIAGNOSIS — G4710 Hypersomnia, unspecified: Secondary | ICD-10-CM

## 2024-02-14 DIAGNOSIS — R5383 Other fatigue: Secondary | ICD-10-CM | POA: Diagnosis not present

## 2024-02-14 DIAGNOSIS — R7301 Impaired fasting glucose: Secondary | ICD-10-CM | POA: Diagnosis not present

## 2024-02-14 NOTE — Patient Instructions (Signed)
 HI Francisco Dixon, Francisco Dixon is OK to go to [place]  for his trip. I do encourage him to take breaks if he feels sleepy.

## 2024-02-14 NOTE — Progress Notes (Signed)
 Established Patient Office Visit  Patient ID: Francisco Dixon, male    DOB: 02/09/1951  Age: 73 y.o. MRN: [id number] PCP: Alvan Dorothyann BIRCH, MD  Chief Complaint  Patient presents with   Fatigue    Subjective:     HPI  Discussed the use of AI scribe software for clinical note transcription with the patient, who gave verbal consent to proceed.  History of Present Illness Francisco Dixon is a 73 year old male who presents with fatigue and post-surgical recovery concerns.  Post-surgical recovery of left knee replacement  - Ten weeks post major surgery - Good knee flexion with range from 0 to 121 degrees - Pain with standing and difficulty with stairs - Plateau in recovery progress - History of limping for seven months prior to surgery, affecting muscle development - Ongoing bone healing from surgical intervention  Fatigue and sleep disturbance - Significant fatigue present even prior to surgery - Averages nine hours of sleep per night - Requires daily naps, especially in the afternoons; previously napped every three days - Occasionally naps for up to two hours if not woken by alarm - Wife reports snoring - No prior evaluation for sleep apnea  Constitutional and systemic symptoms - No chest pain, breathing issues, unusual weight changes, skin or hair changes, bowel irregularities, swelling, abdominal pain, persistent sore throat, or unusual lumps - Night sweats occurred twice post-surgery, now resolved  Headache - Recent headaches, possibly related to weather changes - No persistent headaches over the last two months   Epworth Sleepiness scale of 10 STOP BANG score fo 4     ROS    Objective:     BP 130/71   Pulse 83   Ht 5' 7 (1.702 m)   Wt 165 lb 0.6 oz (74.9 kg)   SpO2 95%   BMI 25.85 kg/m    Physical Exam Constitutional:      Appearance: Normal appearance.  HENT:     Head: Normocephalic and atraumatic.     Right Ear: Tympanic membrane, ear  canal and external ear normal.     Left Ear: Tympanic membrane, ear canal and external ear normal.     Nose: Nose normal.     Mouth/Throat:     Pharynx: Oropharynx is clear.  Eyes:     Extraocular Movements: Extraocular movements intact.     Conjunctiva/sclera: Conjunctivae normal.     Pupils: Pupils are equal, round, and reactive to light.  Neck:     Thyroid : No thyromegaly.  Cardiovascular:     Rate and Rhythm: Normal rate and regular rhythm.  Pulmonary:     Effort: Pulmonary effort is normal.     Breath sounds: Normal breath sounds.  Abdominal:     General: Bowel sounds are normal.     Palpations: Abdomen is soft.     Tenderness: There is no abdominal tenderness.  Musculoskeletal:        General: No swelling.     Cervical back: Neck supple.  Skin:    General: Skin is warm and dry.  Neurological:     Mental Status: He is oriented to person, place, and time.  Psychiatric:        Mood and Affect: Mood normal.        Behavior: Behavior normal.      No results found for any visits on 02/14/24.    The 10-year ASCVD risk score (Arnett DK, et al., 2019) is: 20%    Assessment & Plan:  Problem List Items Addressed This Visit       Endocrine   IFG (impaired fasting glucose)   Relevant Orders   TSH   B12   Vitamin D  (25 hydroxy)   HgB A1c   CBC with Differential/Platelet   Vitamin B1   Iron, TIBC and Ferritin Panel     Other   Vitamin D  deficiency   Relevant Orders   TSH   B12   Vitamin D  (25 hydroxy)   HgB A1c   CBC with Differential/Platelet   Vitamin B1   Iron, TIBC and Ferritin Panel   B12 deficiency   Relevant Orders   TSH   B12   Vitamin D  (25 hydroxy)   HgB A1c   CBC with Differential/Platelet   Vitamin B1   Iron, TIBC and Ferritin Panel   Other Visit Diagnoses       Other fatigue    -  Primary   Relevant Orders   TSH   B12   Vitamin D  (25 hydroxy)   HgB A1c   CBC with Differential/Platelet   Vitamin B1   Iron, TIBC and Ferritin  Panel     Excessive sleepiness       Relevant Orders   TSH   B12   Vitamin D  (25 hydroxy)   HgB A1c   CBC with Differential/Platelet   Vitamin B1   Iron, TIBC and Ferritin Panel       Assessment and Plan Assessment & Plan Postoperative recovery following knee surgery Ten weeks post knee surgery with good flexion. Pain when standing and difficulty with stairs due to recovery and muscle weakness. Bone healing ongoing. Physical therapy reports good progress.  Excessive daytime sleepiness and suspected sleep apnea Increased fatigue and excessive daytime sleepiness, possible sleep apnea suggested by snoring and wife's observations. Differential includes heart problems, thyroid  issues, and anemia. Discussed risks of untreated sleep apnea. - Administer STOP-BANG questionnaire and Epworth Sleepiness Scale. - Order home sleep study if indicated by questionnaire results. - Review previous blood work and EKG results. - Check thyroid  function and other relevant blood tests if not previously done.    Return if symptoms worsen or fail to improve.    Dorothyann Byars, MD [facility] at [facility]

## 2024-02-14 NOTE — Progress Notes (Signed)
 Pt reports that he has been experiencing fatigue that has gotten worse over the past few months.   He gets 9 hours of sleep daily but stated that he wants to take more naps throughout the day.

## 2024-02-15 ENCOUNTER — Ambulatory Visit: Payer: Self-pay | Admitting: Family Medicine

## 2024-02-15 ENCOUNTER — Encounter: Payer: Self-pay | Admitting: Physical Therapy

## 2024-02-15 ENCOUNTER — Ambulatory Visit: Admitting: Physical Therapy

## 2024-02-15 DIAGNOSIS — M25662 Stiffness of left knee, not elsewhere classified: Secondary | ICD-10-CM

## 2024-02-15 DIAGNOSIS — M25562 Pain in left knee: Secondary | ICD-10-CM

## 2024-02-15 DIAGNOSIS — R600 Localized edema: Secondary | ICD-10-CM | POA: Diagnosis not present

## 2024-02-15 DIAGNOSIS — R2689 Other abnormalities of gait and mobility: Secondary | ICD-10-CM

## 2024-02-15 NOTE — Therapy (Signed)
 OUTPATIENT PHYSICAL THERAPY TREATMENT + DISCHARGE   Patient Name: Francisco Dixon MRN: [id number] DOB:November 11, 1950, 73 y.o., male Today's Date: 02/15/2024   PHYSICAL THERAPY DISCHARGE SUMMARY  Visits from Start of Care: 18  Current functional level related to goals / functional outcomes: Reports full return to household/daily tasks, exercising classes; reports difficulty w/ stair descent but no limitations or instability   Remaining deficits: pain   Education / Equipment: HEP, discharge education, follow up with provider    Patient agrees to discharge. Patient goals were mostly met. Patient is being discharged due to being pleased with the current functional level.    END OF SESSION:  PT End of Session - 02/15/24 0925     Visit Number 18    Number of Visits 20    Date for Recertification  03/04/24    Authorization Type medicare    PT Start Time 0925    PT Stop Time 1004    PT Time Calculation (min) 39 min               Past Medical History:  Diagnosis Date   COVID-19 (12/16/2020) 12/17/2020   GERD (gastroesophageal reflux disease)    Hypercholesterolemia    Hypertension    labile    Plantar fasciitis, right 12/07/2016   Vitamin D  deficiency    Past Surgical History:  Procedure Laterality Date   COLONOSCOPY N/A 2002,2007,2012   ROTATOR CUFF REPAIR Right 2015   Patient Active Problem List   Diagnosis Date Noted   Primary osteoarthritis of left knee 07/13/2023   Hearing loss 07/09/2023   Hemangioma flammeus 07/09/2023   IFG (impaired fasting glucose) 07/09/2023   B12 deficiency 11/12/2018   Numbness and tingling of both feet 11/27/2017   Major depression in full remission 07/09/2017   Medication management 07/07/2017   Abnormal glucose 07/07/2017   Allergy 07/07/2017   Labile hypertension 02/26/2016   Hyperlipidemia, mixed 02/26/2016   Vitamin D  deficiency 02/26/2016    PCP: Alvan Dorothyann BIRCH, MD  REFERRING PROVIDER: Yvone Rush,  MD  REFERRING DIAG: [phone] (ICD-10-CM) - History of total left knee replacement (TKR)  THERAPY DIAG:  Left knee pain, unspecified chronicity  Stiffness of left knee, not elsewhere classified  Other abnormalities of gait and mobility  Rationale for Evaluation and Treatment: Rehabilitation  ONSET DATE: s/p L TKA DOS 8/20   SUBJECTIVE:   SUBJECTIVE STATEMENT:  02/15/2024: states he feels ready to discharge today. Did have some aching with weather changes. Still having difficulty with stair descent but no limitations in daily/household tasks, exercising in classes without limitations. Saw PCP for fatigue yesterday and is addressing this with them.      EVAL: Spouse currently assisting heavily with self care, housework. Has been working on exercises since surgery, feels pain but has been very adherent with them.  Prior to surgery pt was limited somewhat due to pain, but states he was doing yardwork, modified exercise classes 3x/week (stretching, aerobics, resistance training). Enjoys golfing, hiking, biking but was limited prior to surgery and using cane for a few weeks due to pain.  Is traveling to see daughter in beginning of October and wants to be active for that.   PERTINENT HISTORY: GERD, HTN PAIN:  Are you having pain: 0/10 L knee described as soreness - no more than 2-3/10 in past week  Per eval:  Location/description: L knee, anterior and posterior Best-worst: 3-8/10; will tend to settle quickly   - aggravating factors: transfers, walking, stairs, housework, stretching - Easing  factors: icing, movement    PRECAUTIONS: None  RED FLAGS: None Reports some nausea/dizziness w/ medications, improving No calf pain, fevers/chills, SOB Reports good compliance w/ post op aspirin   WEIGHT BEARING RESTRICTIONS: No  FALLS:  Has patient fallen in last 6 months? Yes. Number of falls 1 fall, walking puppy, denies injury, denies issues with balance  LIVING ENVIRONMENT: 1 story;  entry through basement , 13 steps 1 rail to main level; front entrance 3+4 STE 1 rail with sloped entry Lives w/ wife who is also retired  OCCUPATION: retired - was a building services engineer for 40 years in [place]    PLOF: Independent  PATIENT GOALS: take trip to see daughter, wants to get back to normal and be more active again (hiking, golfing, biking)   NEXT MD VISIT: November 18th   OBJECTIVE:  Note: Objective measures were completed at Evaluation unless otherwise noted.  DIAGNOSTIC FINDINGS:  S/p L TKA DOS 8/20   PATIENT SURVEYS:  LEFS: 6/80  01/07/24 LEFS 33/80   02/05/24 LEFS deferred given technical difficulties   02/15/24: LEFS 55/80  COGNITION: Overall cognitive status: Within functional limits for tasks assessed     EDEMA/INSPECTION:  Incision obscured by dressing and compression stocking, no calf pain or swelling, no TTP about calf, no pain w/ DF; no visible drainage  01/07/24: - incision well appearing overall, distally does have ~ 2.5 cm portion that is scabbed/reddish (remainder of incision pink and closed without scabbing) but no surrounding erythema or drainage. Pt states this has remained unchanged since removing bandage Saturday  02/15/24: - incision well healed, no concerns with appearance   LOWER EXTREMITY ROM:      Right eval Left eval Left 12/14/23 Left 12/19/23 Left 12/27/23 Left 12/31/23 Left 01/07/24 Left 01/16/24 L 02/05/24  Hip flexion           Hip extension           Hip internal rotation           Hip external rotation           Knee extension  A: lacking 8 deg w/ towel under calf  AROM 5 deg AROM 5 deg  AROM lacking 3 deg AROM lacking 3-4 deg without prop  Full with prop   Full   Knee flexion  P: 70-72 deg AAROM 90 deg AAROM 101 deg AAROM 109 on bike  AROM supine 114 deg AROM 117 deg supine AROM 118 deg supine   A: 121 deg   (Blank rows = not tested) (Key: WFL = within functional limits not formally assessed, * = concordant pain, s =  stiffness/stretching sensation, NT = not tested)  Comments:   LOWER EXTREMITY MMT:    MMT Right 02/05/24 Left 02/05/24 R/L 02/15/24  Hip flexion     Hip abduction (modified sitting)     Hip internal rotation     Hip external rotation     Knee flexion 4+ 4+   Knee extension 4+ 4 4+/4+ (reports increased effort on LLE)  Ankle dorsiflexion      (Blank rows = not tested) (Key: WFL = within functional limits not formally assessed, * = concordant pain, s = stiffness/stretching sensation, NT = not tested)  Comments: deferred on eval given acuity of surgery   FUNCTIONAL TESTS:  TUG: 25.13 w RW; weight shift to RLE during transfer, BIL UE from RW   01/07/24: TUG 12.01sec no AD but significant alteration in kinematics; 10.56sec w SPC improved mechanics  02/05/24: 5xSTS 8 sec no UE support TUG 8 sec no AD    FUNCTIONAL GAIT ASSESSMENT:  ITEM 02/05/24 02/15/24  1 Gait Level Surface mild impairment 2  2  2  Change in Gait Speed Normal 3  3  3  Gait with Horizontal Head Turns Normal 3  3  4  Gait with Vertical Head Turns Normal 3  3  5  Gait with Pivot Turn mild impairment 2  3  6  Step Over Obstacle mild impairment 2  2  7  Gait with Narrow Base of Support severe impairment 0 (able to recover LOB without assist aside from 1 instance requiring min A) 0 (able to recover LOB without assist, appropriate stepping response)  8 Gait with Eyes Closed mild impairment 2  2  9  Ambulating Backwards mild impairment 2  2  10  Steps Normal 3  3  Total: 22/30 23/30   * Score of <=22/30 indicates that patient is at increased risk for falls.   GAIT: Distance walked: within clinic Assistive device utilized: Environmental Consultant - 2 wheeled Level of assistance: Modified independence Comments: step to pattern leading w/ LLE, discontinuous RW management, reduced knee ROM throughout all phases    [facility] Adult PT Treatment:                                                DATE: 02/15/24 Therapeutic  Exercise: Recumbent bike 5 min during subjective  Lateral step down 8 inch x10 8 inch fwd step down x8  Bosu step up fwd x10 HEP handout + education/discussion, verbal review of remainder of program  Therapeutic Activity: MSK assessment + education FGA + education Education/discussion re: progress with PT, symptom behavior as it affects activity tolerance, PT goals/POC, discharge education, follow up with providers, activity modification as indicated                                                                PATIENT EDUCATION:  Education details: PT POC, PT goals, progress with PT thus far, discharge planning, HEP, follow up with provider Person educated: Patient Education method: Explanation, Demonstration, Verbal cues Education comprehension: verbalized understanding, returned demonstration   HOME EXERCISE PROGRAM: Access Code: [email or web address] URL: [email or web address] Date: 02/15/2024 Prepared by: Alm Jenny  Exercises - Prone Knee Flexion  - 3-4 x weekly - 2-3 sets - 10 reps - Mini Squat with Counter Support  - 2-3 x daily - 1 sets - 8 reps - Seated Quad Set  - 3-4 x weekly - 2-3 sets - 10 reps - Prone Terminal Knee Extension  - 3-4 x weekly - 2-3 sets - 10 reps - Standing Terminal Knee Extension at Wall with Ball  - 3-4 x weekly - 2-3 sets - 10 reps - 5 sec hold - Standing Terminal Knee Extension with Resistance  - 3-4 x weekly - 2-3 sets - 10 reps - Standing Lateral Step-Down Heel Tap  - 3-4 x weekly - 2-3 sets - 10 reps - Forward Step Down with Counter Support at Side  - 3-4 x weekly - 2-3 sets - 8 reps - Runner's Step Up on BOSU Ball  - 3-4 x weekly -  2-3 sets - 10 reps - Side Stepping with Counter Support  - 3-4 x weekly - 2-3 sets - 10 reps - Backward Walking with Counter Support  - 3-4 x weekly - 2-3 sets - 10 reps  ASSESSMENT:  CLINICAL IMPRESSION:  02/15/2024: Pt arrives and states he feels ready to discharge today. Still some increased  difficulty/effort descending stairs and pain with prolonged standing in place, but denies overt limitations, has returned to usual daily/exercise activities. LTG addressed below - has met or partially met all goals with exception of FGA, which showed 1 pt improvement compared to last measurement, and in discussion with pt is consistent w/ his chronic balance deficits in context of neuropathy. Tolerates session well without pain or adverse event, politely defers full HEP performance but we do work on step up variations to ensure appropriate performance. Continues to demo improved quad activation. In discussion w/ pt, mutual decision is made to discharge to independent HEP at this time, continue provider follow up as indicated. Pt departs today's session in no acute distress, all voiced questions/concerns addressed appropriately from PT perspective.      EVAL: Patient is a pleasant 73 y.o. gentleman who was seen today for physical therapy evaluation and treatment for L TKA DOS 8/20. Pt endorses difficulty w/ ADLs and functional mobility as expected for post surgical status. No red flags today. On exam he demonstrates concordant deficits in L knee ROM, altered gait/transfer mechanics, post op edema. TUG is indicative of reduced mobility and fall risk. Tolerates exam/HEP well overall, no adverse events, self care education as above. Recommend skilled PT to address aforementioned deficits with aim of improving functional tolerance and reducing pain with typical activities. Pt departs today's session in no acute distress, all voiced concerns/questions addressed appropriately from PT perspective.     OBJECTIVE IMPAIRMENTS: Abnormal gait, decreased activity tolerance, decreased balance, decreased strength, improper body mechanics, and pain.      GOALS:  SHORT TERM GOALS: Target date: 01/04/2024  Pt will demonstrate appropriate understanding and performance of initially prescribed HEP in order to facilitate  improved independence with management of symptoms.  Baseline: HEP established  01/02/24: reports good HEP performance Goal status: MET  2. Pt will report at least 25% improvement in overall pain levels over past week in order to facilitate improved tolerance to typical daily activities.   Baseline: 3-8/10 01/07/24: 0-2/10 Goal status: MET  LONG TERM GOALS: Target date: 03/04/2024 (update 02/05/24)   Pt will score 45 or greater on LEFS in order to demonstrate improved perception of function due to symptoms (MCID 9 pts) Baseline: 6/80 01/07/24: 33/80 02/05/24: deferred given technical difficulties 02/15/24: 55/80 Goal status: MET  2.  Pt will demonstrate at least 0-110 degrees of knee AROM on surgical limb in order to facilitate improved tolerance to functional movements such as squatting, walking, and stair navigation.  Baseline: see ROM chart above 01/07/24: see ROM chart above  02/05/24: 0-121 deg Goal status: MET  3.  Pt will demonstrate appropriate performance of final prescribed HEP in order to facilitate improved self-management of symptoms post-discharge.   Baseline: initial HEP prescribed   01/07/24: reports good HEP performance  02/05/24: reports good HEP adherence  02/15/24: good HEP performance/adherence  Goal status: MET  4.  Pt will be able to perform TUG in less than or equal to 13 sec in order to indicate reduced risk of falling (cutoff score for fall risk 13.5 sec in community dwelling older adults per [facility] et al, 2000)  Baseline: 25sec RW  01/07/24: 10-12sec (SPC vs no AD respectively  Goal status: MET   5. Pt will report at least 50% decrease in overall pain levels in past week in order to facilitate improved tolerance to basic ADLs/mobility.   Baseline: 3-8/10  01/07/24: 0-2/10  Goal status: MET  6. Pt will demonstrate symmetrical knee MMT between surgical and non-surgical limb in order to facilitate improved functional strength and mechanics.  Baseline:  deferred on eval given proximity to surgery  01/07/24: deferred given proximity to surgery  02/05/24: mild quad weakness  02/15/24: subjective quad weakness, comparable on MMT  Goal status: PARTIALLY MET  7. Pt will score greater than or equal to 26/30 on Functional Gait assessment in order to indicate reduced fall risk (cutoff score </= 22/30 predictive of falls per Willye et al 2010, MCID 4 pts Beninato et al 2014)  Baseline: 22/30  02/15/24: 23/30  Goal status: NOT MET   PLAN: DISCHARGE 02/15/24     Alm DELENA Jenny PT, DPT 02/15/2024 11:52 AM

## 2024-02-15 NOTE — Progress Notes (Signed)
 Hi Francisco Dixon, thyroid  looks great.  B12 has dropped a little bit compared to last time.  So just make sure you are getting enough in your diet or taking a multivitamin that has B12.  Vitamin D  still looks great.  MCV is in the normal range.  Blood count looks great no sign of anemia.  Iron levels are normal.  vitamin B1 still pending.

## 2024-02-20 LAB — CBC WITH DIFFERENTIAL/PLATELET
Basophils Absolute: 0.1 x10E3/uL (ref 0.0–0.2)
Basos: 1 %
EOS (ABSOLUTE): 0.3 x10E3/uL (ref 0.0–0.4)
Eos: 4 %
Hematocrit: 43.8 % (ref 37.5–51.0)
Hemoglobin: 14.5 g/dL (ref 13.0–17.7)
Immature Grans (Abs): 0 x10E3/uL (ref 0.0–0.1)
Immature Granulocytes: 0 %
Lymphocytes Absolute: 1.8 x10E3/uL (ref 0.7–3.1)
Lymphs: 28 %
MCH: 29.9 pg (ref 26.6–33.0)
MCHC: 33.1 g/dL (ref 31.5–35.7)
MCV: 90 fL (ref 79–97)
Monocytes Absolute: 0.5 x10E3/uL (ref 0.1–0.9)
Monocytes: 7 %
Neutrophils Absolute: 3.7 x10E3/uL (ref 1.4–7.0)
Neutrophils: 60 %
Platelets: 245 x10E3/uL (ref 150–450)
RBC: 4.85 x10E6/uL (ref 4.14–5.80)
RDW: 11.7 % (ref 11.6–15.4)
WBC: 6.2 x10E3/uL (ref 3.4–10.8)

## 2024-02-20 LAB — HEMOGLOBIN A1C
Est. average glucose Bld gHb Est-mCnc: 114 mg/dL
Hgb A1c MFr Bld: 5.6 % (ref 4.8–5.6)

## 2024-02-20 LAB — TSH: TSH: 2.5 u[IU]/mL (ref 0.450–4.500)

## 2024-02-20 LAB — IRON,TIBC AND FERRITIN PANEL
Ferritin: 93 ng/mL (ref 30–400)
Iron Saturation: 28 % (ref 15–55)
Iron: 86 ug/dL (ref 38–169)
Total Iron Binding Capacity: 307 ug/dL (ref 250–450)
UIBC: 221 ug/dL (ref 111–343)

## 2024-02-20 LAB — VITAMIN B12: Vitamin B-12: 324 pg/mL (ref 232–1245)

## 2024-02-20 LAB — VITAMIN B1: Thiamine: 100.9 nmol/L (ref 66.5–200.0)

## 2024-02-20 LAB — VITAMIN D 25 HYDROXY (VIT D DEFICIENCY, FRACTURES): Vit D, 25-Hydroxy: 90.3 ng/mL (ref 30.0–100.0)

## 2024-02-20 NOTE — Progress Notes (Signed)
 Vitamin B1 also known as thiamine level looks great.

## 2024-04-07 ENCOUNTER — Ambulatory Visit
Admission: EM | Admit: 2024-04-07 | Discharge: 2024-04-07 | Disposition: A | Discharge status: Home / Self Care | Attending: Family Medicine | Admitting: Family Medicine

## 2024-04-07 DIAGNOSIS — J0140 Acute pansinusitis, unspecified: Secondary | ICD-10-CM

## 2024-04-07 MED ORDER — AMOXICILLIN-POT CLAVULANATE 875-125 MG PO TABS: 1.0000 | ORAL_TABLET | Freq: Two times a day (BID) | ORAL | 0 refills | Status: AC

## 2024-04-07 NOTE — Discharge Instructions (Signed)
 Take the Augmentin  antibiotic 2 times a day.  Take this antibiotic with food Take prednisone  twice a day for the first 5 days.  This will help take down congestion Try to increase your water intake See your doctor if not improving by next week

## 2024-04-07 NOTE — ED Provider Notes (Signed)
 " [facility]    CSN: [id number] Arrival date & time: 04/07/24  1452      History   Chief Complaint Chief Complaint  Patient presents with   Cough   Headache   Sore Throat    HPI Francisco Dixon is a 73 y.o. male.   HPI Patient states he used to get sinus infections every year.  He has not had one for a couple of years.  Now he states he has sore throat, sinus congestion, yellow drainage, ear pressure and pain, face pain, and is starting to cough.  Does have some underlying allergies.  Past Medical History:  Diagnosis Date   COVID-19 (12/16/2020) 12/17/2020   GERD (gastroesophageal reflux disease)    Hypercholesterolemia    Hypertension    labile    Plantar fasciitis, right 12/07/2016   Vitamin D  deficiency     Patient Active Problem List   Diagnosis Date Noted   Primary osteoarthritis of left knee 07/13/2023   Hearing loss 07/09/2023   Hemangioma flammeus 07/09/2023   IFG (impaired fasting glucose) 07/09/2023   B12 deficiency 11/12/2018   Numbness and tingling of both feet 11/27/2017   Major depression in full remission 07/09/2017   Medication management 07/07/2017   Abnormal glucose 07/07/2017   Allergy 07/07/2017   Labile hypertension 02/26/2016   Hyperlipidemia, mixed 02/26/2016   Vitamin D  deficiency 02/26/2016    Past Surgical History:  Procedure Laterality Date   COLONOSCOPY N/A 2002,2007,2012   ROTATOR CUFF REPAIR Right 2015       Home Medications    Prior to Admission medications  Medication Sig Start Date End Date Taking? Authorizing Provider  amoxicillin -clavulanate (AUGMENTIN ) 875-125 MG tablet Take 1 tablet by mouth every 12 (twelve) hours. 04/07/24  Yes Maranda Jamee Jacob, MD  predniSONE  (DELTASONE ) 20 MG tablet Take 1 tablet (20 mg total) by mouth 2 (two) times daily with a meal. 04/07/24  Yes Maranda Jamee Jacob, MD  aspirin  EC 81 MG tablet Take  1 tablet  Daily 06/19/21   Tonita Fallow, MD  Cholecalciferol (VITAMIN D3) 125  MCG (5000 UT) CAPS Take 2 capsules Daily 06/19/21   Tonita Fallow, MD  Flaxseed, Linseed, (FLAXSEED OIL) 1000 MG CAPS Take 2 capsules by mouth daily.    [provider]  Omega-3 Fatty Acids (FISH OIL ) 1000 MG CAPS Takes 1 capsule daily 04/02/17   Tonita Fallow, MD  rosuvastatin  (CRESTOR ) 20 MG tablet Take 1 tablet  every day  for Cholesterol 09/20/23   Alvan Dorothyann BIRCH, MD  venlafaxine  XR (EFFEXOR -XR) 75 MG 24 hr capsule TAKE ONE CAPSULE BY MOUTH DAILY FOR mood 08/07/23   Alvan Dorothyann BIRCH, MD    Family History History reviewed. No pertinent family history.  Social History Social History[1]   Allergies   Sulfa  antibiotics and Other   Review of Systems Review of Systems  See HPI Physical Exam Triage Vital Signs ED Triage Vitals  Encounter Vitals Group     BP 04/07/24 1553 138/81     Girls Systolic BP Percentile --      Girls Diastolic BP Percentile --      Boys Systolic BP Percentile --      Boys Diastolic BP Percentile --      Pulse Rate 04/07/24 1553 71     Resp 04/07/24 1553 17     Temp 04/07/24 1553 98.3 F (36.8 C)     Temp Source 04/07/24 1553 Oral     SpO2 04/07/24 1553 97 %  Weight --      Height --      Head Circumference --      Peak Flow --      Pain Score 04/07/24 1555 0     Pain Loc --      Pain Education --      Exclude from Growth Chart --    No data found.  Updated Vital Signs BP 138/81 (BP Location: Right Arm)   Pulse 71   Temp 98.3 F (36.8 C) (Oral)   Resp 17   SpO2 97%       Physical Exam Constitutional:      General: He is not in acute distress.    Appearance: He is well-developed. He is ill-appearing.  HENT:     Head: Normocephalic and atraumatic.     Right Ear: Tympanic membrane normal.     Left Ear: Tympanic membrane normal.     Nose: Congestion and rhinorrhea present.     Comments: Nasal membranes patient sinuses are tender    Mouth/Throat:     Pharynx: Posterior oropharyngeal erythema present.  Eyes:      Conjunctiva/sclera: Conjunctivae normal.     Pupils: Pupils are equal, round, and reactive to light.  Cardiovascular:     Rate and Rhythm: Normal rate and regular rhythm.     Heart sounds: Normal heart sounds.  Pulmonary:     Effort: Pulmonary effort is normal. No respiratory distress.  Musculoskeletal:        General: Normal range of motion.     Cervical back: Normal range of motion.  Lymphadenopathy:     Cervical: Cervical adenopathy present.  Skin:    General: Skin is warm and dry.  Neurological:     Mental Status: He is alert.      UC Treatments / Results  Labs (all labs ordered are listed, but only abnormal results are displayed) Labs Reviewed - No data to display  EKG   Radiology No results found.  Procedures Procedures (including critical care time)  Medications Ordered in UC Medications - No data to display  Initial Impression / Assessment and Plan / UC Course  I have reviewed the triage vital signs and the nursing notes.  Pertinent labs & imaging results that were available during my care of the patient were reviewed by me and considered in my medical decision making (see chart for details).     Final Clinical Impressions(s) / UC Diagnoses   Final diagnoses:  Acute pansinusitis, recurrence not specified     Discharge Instructions      Take the Augmentin  antibiotic 2 times a day.  Take this antibiotic with food Take prednisone  twice a day for the first 5 days.  This will help take down congestion Try to increase your water intake See your doctor if not improving by next week   ED Prescriptions     Medication Sig Dispense Auth. Provider   amoxicillin -clavulanate (AUGMENTIN ) 875-125 MG tablet Take 1 tablet by mouth every 12 (twelve) hours. 14 tablet Maranda Jamee Jacob, MD   predniSONE  (DELTASONE ) 20 MG tablet Take 1 tablet (20 mg total) by mouth 2 (two) times daily with a meal. 10 tablet Maranda Jamee Jacob, MD      PDMP not reviewed this  encounter.    [1]  Social History Tobacco Use   Smoking status: Never   Smokeless tobacco: Never  Substance Use Topics   Alcohol use: Yes    Comment: drinks 2-3 beers a week  Drug use: No     Maranda Jamee Jacob, MD 04/07/24 [phone]  "

## 2024-04-07 NOTE — ED Triage Notes (Addendum)
 Pt c/o HA, cough and sore throat x 5 days. Denies fever. Sinus OTC and airborne prn. Hx of sinus infections.
